# Patient Record
Sex: Male | Born: 1960
Health system: Southern US, Community
[De-identification: ages and names within clinical notes are randomized; demographics above are authoritative.]

## PROBLEM LIST (undated history)

## (undated) ENCOUNTER — Emergency Department (HOSPITAL_COMMUNITY): Disposition: A | Discharge status: Home / Self Care | Payer: Self-pay

## (undated) DIAGNOSIS — T7840XA Allergy, unspecified, initial encounter: Secondary | ICD-10-CM

## (undated) DIAGNOSIS — G4730 Sleep apnea, unspecified: Secondary | ICD-10-CM

## (undated) DIAGNOSIS — F419 Anxiety disorder, unspecified: Secondary | ICD-10-CM

## (undated) DIAGNOSIS — E271 Primary adrenocortical insufficiency: Secondary | ICD-10-CM

## (undated) DIAGNOSIS — R739 Hyperglycemia, unspecified: Secondary | ICD-10-CM

## (undated) DIAGNOSIS — I471 Supraventricular tachycardia, unspecified: Secondary | ICD-10-CM

## (undated) DIAGNOSIS — E079 Disorder of thyroid, unspecified: Secondary | ICD-10-CM

## (undated) DIAGNOSIS — I10 Essential (primary) hypertension: Secondary | ICD-10-CM

## (undated) DIAGNOSIS — C4491 Basal cell carcinoma of skin, unspecified: Secondary | ICD-10-CM

## (undated) DIAGNOSIS — C159 Malignant neoplasm of esophagus, unspecified: Secondary | ICD-10-CM

## (undated) DIAGNOSIS — Z720 Tobacco use: Secondary | ICD-10-CM

## (undated) DIAGNOSIS — E785 Hyperlipidemia, unspecified: Secondary | ICD-10-CM

## (undated) DIAGNOSIS — D333 Benign neoplasm of cranial nerves: Secondary | ICD-10-CM

## (undated) DIAGNOSIS — U071 COVID-19: Secondary | ICD-10-CM

## (undated) DIAGNOSIS — K219 Gastro-esophageal reflux disease without esophagitis: Secondary | ICD-10-CM

## (undated) HISTORY — PX: INTERCOSTAL NERVE BLOCK: SHX5021

## (undated) HISTORY — DX: Supraventricular tachycardia: I47.1

## (undated) HISTORY — PX: UPPER GASTROINTESTINAL ENDOSCOPY: SHX188

## (undated) HISTORY — DX: COVID-19: U07.1

## (undated) HISTORY — PX: OTHER SURGICAL HISTORY: SHX169

## (undated) HISTORY — DX: Essential (primary) hypertension: I10

## (undated) HISTORY — DX: Sleep apnea, unspecified: G47.30

## (undated) HISTORY — DX: Supraventricular tachycardia, unspecified: I47.10

## (undated) HISTORY — DX: Gastro-esophageal reflux disease without esophagitis: K21.9

## (undated) HISTORY — PX: COLONOSCOPY: SHX174

## (undated) HISTORY — DX: Disorder of thyroid, unspecified: E07.9

## (undated) HISTORY — DX: Anxiety disorder, unspecified: F41.9

## (undated) HISTORY — DX: Allergy, unspecified, initial encounter: T78.40XA

## (undated) HISTORY — DX: Tobacco use: Z72.0

## (undated) HISTORY — PX: SHOULDER SURGERY: SHX246

## (undated) HISTORY — DX: Hyperlipidemia, unspecified: E78.5

## (undated) HISTORY — DX: Hyperglycemia, unspecified: R73.9

---

## 1989-05-10 HISTORY — PX: LAPAROSCOPIC CHOLECYSTECTOMY: SUR755

## 1999-03-18 ENCOUNTER — Ambulatory Visit (HOSPITAL_COMMUNITY): Admission: RE | Admit: 1999-03-18 | Discharge: 1999-03-18 | Payer: Self-pay | Admitting: Orthopedic Surgery

## 1999-03-18 ENCOUNTER — Encounter: Payer: Self-pay | Admitting: Orthopedic Surgery

## 2003-09-20 ENCOUNTER — Other Ambulatory Visit: Payer: Self-pay

## 2004-04-21 ENCOUNTER — Ambulatory Visit: Payer: Self-pay | Admitting: Internal Medicine

## 2004-04-24 ENCOUNTER — Ambulatory Visit: Payer: Self-pay | Admitting: Gastroenterology

## 2004-05-19 ENCOUNTER — Ambulatory Visit: Payer: Self-pay | Admitting: Gastroenterology

## 2004-05-19 ENCOUNTER — Ambulatory Visit (HOSPITAL_COMMUNITY): Admission: RE | Admit: 2004-05-19 | Discharge: 2004-05-19 | Payer: Self-pay | Admitting: Gastroenterology

## 2004-05-20 ENCOUNTER — Ambulatory Visit: Payer: Self-pay | Admitting: Gastroenterology

## 2004-06-02 ENCOUNTER — Ambulatory Visit: Payer: Self-pay | Admitting: Internal Medicine

## 2004-06-19 ENCOUNTER — Ambulatory Visit: Payer: Self-pay | Admitting: Internal Medicine

## 2004-07-13 ENCOUNTER — Ambulatory Visit: Payer: Self-pay | Admitting: Internal Medicine

## 2004-07-14 ENCOUNTER — Ambulatory Visit: Payer: Self-pay | Admitting: Family Medicine

## 2004-10-30 ENCOUNTER — Ambulatory Visit: Payer: Self-pay | Admitting: Family Medicine

## 2004-10-30 ENCOUNTER — Encounter (INDEPENDENT_AMBULATORY_CARE_PROVIDER_SITE_OTHER): Payer: Self-pay | Admitting: *Deleted

## 2004-11-11 ENCOUNTER — Ambulatory Visit: Payer: Self-pay | Admitting: Internal Medicine

## 2005-01-18 ENCOUNTER — Ambulatory Visit: Payer: Self-pay | Admitting: Internal Medicine

## 2005-05-19 ENCOUNTER — Encounter: Admission: RE | Admit: 2005-05-19 | Discharge: 2005-05-19 | Payer: Self-pay | Admitting: Orthopedic Surgery

## 2005-06-25 ENCOUNTER — Encounter: Admission: RE | Admit: 2005-06-25 | Discharge: 2005-06-25 | Payer: Self-pay | Admitting: Unknown Physician Specialty

## 2005-07-06 ENCOUNTER — Encounter: Admission: RE | Admit: 2005-07-06 | Discharge: 2005-07-06 | Payer: Self-pay | Admitting: Unknown Physician Specialty

## 2005-07-15 ENCOUNTER — Ambulatory Visit: Payer: Self-pay | Admitting: Pulmonary Disease

## 2005-08-05 ENCOUNTER — Ambulatory Visit: Payer: Self-pay | Admitting: Pulmonary Disease

## 2005-09-27 ENCOUNTER — Ambulatory Visit: Payer: Self-pay | Admitting: Internal Medicine

## 2005-10-06 ENCOUNTER — Encounter: Admission: RE | Admit: 2005-10-06 | Discharge: 2005-10-06 | Payer: Self-pay | Admitting: Orthopedic Surgery

## 2005-10-28 ENCOUNTER — Ambulatory Visit (HOSPITAL_BASED_OUTPATIENT_CLINIC_OR_DEPARTMENT_OTHER): Admission: RE | Admit: 2005-10-28 | Discharge: 2005-10-28 | Payer: Self-pay | Admitting: Orthopedic Surgery

## 2006-01-14 ENCOUNTER — Ambulatory Visit: Payer: Self-pay | Admitting: Internal Medicine

## 2006-07-06 ENCOUNTER — Ambulatory Visit: Payer: Self-pay | Admitting: Internal Medicine

## 2007-03-10 ENCOUNTER — Encounter: Admission: RE | Admit: 2007-03-10 | Discharge: 2007-03-10 | Payer: Self-pay | Admitting: Unknown Physician Specialty

## 2009-05-15 ENCOUNTER — Encounter: Admission: RE | Admit: 2009-05-15 | Discharge: 2009-05-15 | Payer: Self-pay | Admitting: Unknown Physician Specialty

## 2009-08-07 ENCOUNTER — Encounter: Admission: RE | Admit: 2009-08-07 | Discharge: 2009-08-07 | Payer: Self-pay | Admitting: Unknown Physician Specialty

## 2010-09-25 NOTE — Op Note (Signed)
Jose Hayes, Jose Hayes            ACCOUNT NO.:  [id number]   MEDICAL RECORD NO.:  [id number]          PATIENT TYPE:  AMB   LOCATION:  DSC                          FACILITY:  [facility]   PHYSICIAN:  Loreta Ave, M.D. DATE OF BIRTH:  [birth year]   DATE OF PROCEDURE:  10/28/2005  DATE OF DISCHARGE:                                 OPERATIVE REPORT   PREOPERATIVE DIAGNOSES:  1.  Acute dislocation with anterior instability, right shoulder.  2.  Bankart lesion.  3.  Previous open capsular stabilization in 1990 without intervening      instability until new traumatic event.   POSTOPERATIVE DIAGNOSES:  1.  Acute dislocation with anterior instability, right shoulder.  2.  Anterior superior labrum anterior to posterior lesion, Bankart lesion,      acute-on-chronic Hill-Sachs lesion, and one osteochondral loose body.  3.  Previous open capsular stabilization in 1990 without intervening      instability until new traumatic event.   PROCEDURE:  With shoulder exam under anesthesia, arthroscopy, chondroplasty  of the humerus, removal of loose body, Bankart reconstruction and superior  labrum anterior to posterior repair utilizing four bioabsorbable knot-less  Arthrex anchors and FiberWire sutures.   SURGEON:  Loreta Ave, M.D.   ASSISTANT:  Genene Churn. Denton Meek., present throughout the case.   SPECIMENS:  None.   CULTURES:  None.   COMPLICATIONS:  None.   DRESSINGS:  Sterile compressive shoulder immobilizer.   PROCEDURE:  Patient was brought to the operating room, placed on the  operating room table in the supine position.  After adequate anesthesia had  been obtained, the shoulder was examined.  Full motion with increased  external rotation as well as anterior instability.  No posterior  instability.  With the patient in a beach-chair position, prepped and draped  in the usual sterile fashion.  One posterior and two anterior portals  created with arthroscopic guidance.  The  shoulder was entered with the  arthroscope and standardly inspected.  Bankart lesion from the 12 o'clock  position, which was actually in the anterior SLAP tear, all the way down to  the 6 o'clock position.  Despite the degree of previous intervention, the  shoulder actually looked fairly good.  A 1.5 cm impact lesion, Hill-Sach's  type, back of the humerus, acute-on-chronic.  One relatively large loose  body, 8 mm in diameter, was found in the pouch and removed with grasping  forceps anteriorly.  No tearing of the ligaments off the humerus.  Subscap,  rotator cuff, biceps tendon, biceps anchor intact except for that anterior  limb SLAP tear.  After thorough assessment utilizing two anterior portals,  the front of the glenoid was debrided back to healthy tissue.  I then passed  four FiberWire sutures starting at the 6 o'clock, then 4, then 2, then 12  o'clock position in the front.  All of these were then firmly anchored into  the front of the glenoid with pre-drilling and then placement of knotless  Arthrex anchors which firmly anchored the suture and anterior wall capsular  labral structure to the front of the glenoid.  Restored a nice, firm  anterior wall with good reconstruction as well as good re-anchoring of the  biceps superiorly.  Once this was confirmed, the instruments were completely  removed.  Portals were injected with Marcaine and closed with nylon.  Sterile compressive dressing applied.  Shoulder immobilizer applied.  Anesthesia reversed.  Brought to the recovery room in stable condition.  Tolerated the surgery well without complications.      Loreta Ave, M.D.  Electronically Signed     DFM/MEDQ  D:  10/28/2005  T:  10/28/2005  Job:  [id number]

## 2010-09-25 NOTE — Op Note (Signed)
NAME:  JARIAH, JARMON            ACCOUNT NO.:  [id number]   MEDICAL RECORD NO.:  [id number]          PATIENT TYPE:  AMB   LOCATION:  ENDO                         FACILITY:  [facility]   PHYSICIAN:  Malcolm T. Russella Dar, M.D. [facility] OF BIRTH:  03/12/1961   DATE OF PROCEDURE:  05/19/2004  DATE OF DISCHARGE:  05/19/2004                                 OPERATIVE REPORT   PROCEDURE:  Esophageal manometry.   This study is placed for GERD to allow ambulatory pH monitoring.   Upper esophageal sphincter study:  There is an elevated peak pressure at  203.7 mmHg.  There is normal relaxation with a normal residual pressure at  2.3 mmHg and 98% relaxation.   Esophageal body reveals normal pressures and normal peristalsis.   Lower esophageal sphincter with normal pressures and normal relaxation with  a residual pressure of 4 mmHg.   IMPRESSION:  Essentially normal manometry study.   PLAN:  Proceed with 24-hour pH probe now that the lower esophageal sphincter  has been correctly manometrically determined.      MTS/MEDQ  D:  06/02/2004  T:  06/02/2004  Job:  [id number]

## 2010-09-25 NOTE — Op Note (Signed)
NAME:  Jose Hayes, Jose Hayes            ACCOUNT NO.:  [id number]   MEDICAL RECORD NO.:  [id number]          PATIENT TYPE:  AMB   LOCATION:  ENDO                         FACILITY:  [facility]   PHYSICIAN:  Malcolm T. Russella Dar, M.D. [facility] OF BIRTH:  15-Jan-1961   DATE OF PROCEDURE:  DATE OF DISCHARGE:  05/19/2004                                 OPERATIVE REPORT   AMBULATORY PH MONITORING REPORT:   INDICATIONS:  Evaluation of GERD.   PROCEDURE SUMMARY:  The proximal channel revealed 0.6% of the time greater  than pH 4 with 38 episodes of reflux recorded. The longest episode was 1  minute. The oscillatory index was 0.6.   The distal esophageal port revealed 0.7% of the time a low pH 4 with 37  episodes of reflux, the longest episode of reflux measured 2 minutes. The  oscillatory index was 0.4%. The Johnson-DeMeester score for the distal  channel was 5.6 with normal being less than 22.   IMPRESSION:  No evidence for clinically significant GERD on this study.      MTS/MEDQ  D:  06/02/2004  T:  06/02/2004  Job:  [id number]

## 2010-10-13 ENCOUNTER — Encounter: Payer: Self-pay | Admitting: Gastroenterology

## 2011-11-05 ENCOUNTER — Observation Stay: Payer: Self-pay | Admitting: Internal Medicine

## 2011-11-05 DIAGNOSIS — I499 Cardiac arrhythmia, unspecified: Secondary | ICD-10-CM

## 2011-11-05 DIAGNOSIS — I517 Cardiomegaly: Secondary | ICD-10-CM

## 2011-11-05 LAB — CBC WITH DIFFERENTIAL/PLATELET
Basophil %: 0.7 %
Eosinophil #: 0.2 10*3/uL (ref 0.0–0.7)
Eosinophil %: 2.7 %
HCT: 46.4 % (ref 40.0–52.0)
MCH: 29.8 pg (ref 26.0–34.0)
MCV: 89 fL (ref 80–100)
Neutrophil #: 4 10*3/uL (ref 1.4–6.5)
Neutrophil %: 53.7 %
Platelet: 252 10*3/uL (ref 150–440)
RDW: 13.9 % (ref 11.5–14.5)

## 2011-11-05 LAB — COMPREHENSIVE METABOLIC PANEL
Albumin: 4.2 g/dL (ref 3.4–5.0)
Alkaline Phosphatase: 84 U/L (ref 50–136)
Anion Gap: 10 (ref 7–16)
Chloride: 106 mmol/L (ref 98–107)
Co2: 25 mmol/L (ref 21–32)
EGFR (African American): >60
Glucose: 157 mg/dL — ABNORMAL HIGH (ref 65–99)
SGPT (ALT): 74 U/L
Sodium: 141 mmol/L (ref 136–145)
Total Protein: 7.7 g/dL (ref 6.4–8.2)

## 2011-11-05 LAB — URINALYSIS, COMPLETE
Leukocyte Esterase: NEGATIVE
Nitrite: NEGATIVE
Ph: 7 (ref 4.5–8.0)
Protein: NEGATIVE
Squamous Epithelial: NONE SEEN
WBC UR: NONE SEEN /HPF (ref 0–5)

## 2011-11-05 LAB — CK TOTAL AND CKMB (NOT AT ARMC)
CK, Total: 259 U/L — ABNORMAL HIGH (ref 35–232)
CK, Total: 189 U/L (ref 35–232)
CK-MB: 2 ng/mL (ref 0.5–3.6)

## 2011-11-05 LAB — TSH: Thyroid Stimulating Horm: 1.67 u[IU]/mL

## 2011-11-05 LAB — MAGNESIUM: Magnesium: 2 mg/dL

## 2011-11-05 LAB — TROPONIN I
Troponin-I: 0.02 ng/mL
Troponin-I: 0.04 ng/mL

## 2011-11-06 LAB — LIPID PANEL
HDL Cholesterol: 41 mg/dL (ref 40–60)
Triglycerides: 107 mg/dL (ref 0–200)
VLDL Cholesterol, Calc: 21 mg/dL (ref 5–40)

## 2011-11-06 LAB — BASIC METABOLIC PANEL
Anion Gap: 6 — ABNORMAL LOW (ref 7–16)
Chloride: 107 mmol/L (ref 98–107)
Co2: 29 mmol/L (ref 21–32)
Creatinine: 1.03 mg/dL (ref 0.60–1.30)
Glucose: 93 mg/dL (ref 65–99)
Osmolality: 284 (ref 275–301)
Potassium: 4.1 mmol/L (ref 3.5–5.1)
Sodium: 142 mmol/L (ref 136–145)

## 2011-11-06 LAB — TROPONIN I: Troponin-I: 0.04 ng/mL

## 2011-11-06 LAB — CK TOTAL AND CKMB (NOT AT ARMC): CK-MB: 1.8 ng/mL (ref 0.5–3.6)

## 2011-11-29 ENCOUNTER — Encounter: Payer: Self-pay | Admitting: *Deleted

## 2011-11-29 ENCOUNTER — Encounter: Payer: Self-pay | Admitting: Cardiovascular Disease

## 2011-12-01 ENCOUNTER — Encounter: Payer: Self-pay | Admitting: Cardiovascular Disease

## 2011-12-01 ENCOUNTER — Ambulatory Visit (INDEPENDENT_AMBULATORY_CARE_PROVIDER_SITE_OTHER): Payer: BC Managed Care – PPO | Admitting: Cardiovascular Disease

## 2011-12-01 VITALS — BP 152/84 | HR 68 | Ht 69.0 in | Wt 240.0 lb

## 2011-12-01 DIAGNOSIS — R0602 Shortness of breath: Secondary | ICD-10-CM

## 2011-12-01 DIAGNOSIS — I10 Essential (primary) hypertension: Secondary | ICD-10-CM | POA: Insufficient documentation

## 2011-12-01 DIAGNOSIS — I498 Other specified cardiac arrhythmias: Secondary | ICD-10-CM

## 2011-12-01 DIAGNOSIS — E669 Obesity, unspecified: Secondary | ICD-10-CM

## 2011-12-01 DIAGNOSIS — I471 Supraventricular tachycardia, unspecified: Secondary | ICD-10-CM | POA: Insufficient documentation

## 2011-12-01 DIAGNOSIS — E785 Hyperlipidemia, unspecified: Secondary | ICD-10-CM | POA: Insufficient documentation

## 2011-12-01 DIAGNOSIS — F17200 Nicotine dependence, unspecified, uncomplicated: Secondary | ICD-10-CM

## 2011-12-01 MED ORDER — ATORVASTATIN CALCIUM 20 MG PO TABS: 20.0000 mg | ORAL_TABLET | Freq: Every day | ORAL | Status: DC

## 2011-12-01 MED ORDER — DILTIAZEM HCL ER COATED BEADS 180 MG PO CP24: 180.0000 mg | ORAL_CAPSULE | Freq: Every day | ORAL | Status: DC

## 2011-12-01 NOTE — Assessment & Plan Note (Signed)
We have encouraged continued exercise, careful diet management in an effort to lose weight. 

## 2011-12-01 NOTE — Assessment & Plan Note (Signed)
Improved rhythm since his recent discharge. We will increase the diltiazem to 180 mg daily for better rate and blood pressure control. He'll monitor his blood pressure at home.

## 2011-12-01 NOTE — Assessment & Plan Note (Signed)
We will start Lipitor 10 mg daily lab work in 3 months possibly titrating up to 20 mg daily

## 2011-12-01 NOTE — Progress Notes (Signed)
   Patient ID: Jose Hayes, male    DOB: 02-26-1961, 51 y.o.   MRN: [id number]  HPI Comments: Jose Hayes is a very pleasant 51 year old gentleman with obesity, smoking history who presented to [facility] 11/05/2011 with SVT. Rhythm broke to normal sinus rhythm in the emergency room. Adenosine did not work. Echocardiogram was essentially normal with normal ejection fraction, normal biventricular systolic pressures. He was started on diltiazem 120 mg daily with 30 mg doses for breakthrough arrhythmia.  He reports that he has done well overall but has had 2 episodes with her short tachycardia that did not last very long He has stopped smoking. He has also started to workout again.  EKG today shows normal sinus rhythm with rate 68 beats per minute, no significant ST or T wave changes   Outpatient Encounter Prescriptions as of 12/01/2011  Medication Sig Dispense Refill  . aspirin 81 MG tablet Take 81 mg by mouth daily.      Marland Kitchen diltiazem (CARDIZEM) 30 MG tablet Take 30 mg by mouth as needed.      .  diltiazem (CARDIZEM CD) 120 MG 24 hr capsule Take 120 mg by mouth daily.         Review of Systems  Constitutional: Negative.   HENT: Negative.   Eyes: Negative.   Respiratory: Negative.   Cardiovascular: Negative.   Gastrointestinal: Negative.   Musculoskeletal: Negative.   Skin: Negative.   Neurological: Negative.   Hematological: Negative.   Psychiatric/Behavioral: Negative.   All other systems reviewed and are negative.    BP 152/84  Pulse 68  Ht 5\' 9"  (1.753 m)  Wt 240 lb (108.863 kg)  BMI 35.44 kg/m2  Physical Exam  Nursing note and vitals reviewed. Constitutional: He is oriented to person, place, and time. He appears well-developed and well-nourished.  HENT:  Head: Normocephalic.  Nose: Nose normal.  Mouth/Throat: Oropharynx is clear and moist.  Eyes: Conjunctivae are normal. Pupils are equal, round, and reactive to light.  Neck: Normal range of motion. Neck supple. No  JVD present.  Cardiovascular: Normal rate, regular rhythm, S1 normal, S2 normal, normal heart sounds and intact distal pulses.  Exam reveals no gallop and no friction rub.   No murmur heard. Pulmonary/Chest: Effort normal and breath sounds normal. No respiratory distress. He has no wheezes. He has no rales. He exhibits no tenderness.  Abdominal: Soft. Bowel sounds are normal. He exhibits no distension. There is no tenderness.  Musculoskeletal: Normal range of motion. He exhibits no edema and no tenderness.  Lymphadenopathy:    He has no cervical adenopathy.  Neurological: He is alert and oriented to person, place, and time. Coordination normal.  Skin: Skin is warm and dry. No rash noted. No erythema.  Psychiatric: He has a normal mood and affect. His behavior is normal. Judgment and thought content normal.           Assessment and Plan

## 2011-12-01 NOTE — Patient Instructions (Addendum)
You are doing well. Please take extra diltiazem 30 mg with the 120 mg for total of 180 mg daily If this is too much, call the office and we will stay on 120 mg daily  Please start atorvastatin/lipitor 1/2 daily to start. We can advanace to full pill later if cholesterol continues to be elevated  Please call us if you have new issues that need to be addressed before your next appt.  Your physician wants you to follow-up in: 6 months.  You will receive a reminder letter in the mail two months in advance. If you don't receive a letter, please call our office to schedule the follow-up appointment.

## 2011-12-01 NOTE — Assessment & Plan Note (Signed)
He has stopped smoking. We have complemented him on this

## 2011-12-01 NOTE — Assessment & Plan Note (Signed)
He does report that blood pressure has improved on diltiazem. He'll continue to monitor his pressures at home on a higher dose diltiazem.

## 2012-01-13 ENCOUNTER — Other Ambulatory Visit: Payer: Self-pay | Admitting: Unknown Physician Specialty

## 2012-01-13 DIAGNOSIS — M79632 Pain in left forearm: Secondary | ICD-10-CM

## 2012-01-14 ENCOUNTER — Ambulatory Visit
Admission: RE | Admit: 2012-01-14 | Discharge: 2012-01-14 | Disposition: A | Discharge status: Home / Self Care | Payer: BC Managed Care – PPO | Source: Ambulatory Visit | Attending: Unknown Physician Specialty | Admitting: Unknown Physician Specialty

## 2012-01-14 DIAGNOSIS — M79632 Pain in left forearm: Secondary | ICD-10-CM

## 2012-01-27 ENCOUNTER — Encounter: Payer: Self-pay | Admitting: Gastroenterology

## 2012-04-12 ENCOUNTER — Telehealth: Payer: Self-pay | Admitting: Cardiovascular Disease

## 2012-04-12 NOTE — Telephone Encounter (Signed)
See below and advise thanks 

## 2012-04-12 NOTE — Telephone Encounter (Signed)
Pt says he is taking diltiazem 180 mg daily and atorvastatin. He stopped ASA He says he was noticing a "flushed" feeling and decided to hold diltiazem for a few days Says the flushing sensation improved but BP began to increase again He resumed diltiazem yesterday BP today=150/90 Flushing sensation returned I told him I would discuss with Dr. Mariah Milling to see if he needs to make any adjustments to meds, etc and call pt back Understanding verb

## 2012-04-12 NOTE — Telephone Encounter (Signed)
Pt called stating that his BP is running high. Was feeling flush so he stopped his meds for a few days and BP went up but now is not going back to where it was. Wants to know if he needs to do something different.

## 2012-04-12 NOTE — Telephone Encounter (Signed)
We could change him from diltiazem to something similar hold verapamil Would start verapamil sustained release 240 mg daily

## 2012-04-13 ENCOUNTER — Other Ambulatory Visit: Payer: Self-pay

## 2012-04-13 MED ORDER — VERAPAMIL HCL ER 240 MG PO TBCR: 240.0000 mg | EXTENDED_RELEASE_TABLET | Freq: Every day | ORAL | Status: DC

## 2012-04-13 NOTE — Telephone Encounter (Signed)
Pt informed Understanding verb New RX sent to pharm  

## 2012-06-02 ENCOUNTER — Ambulatory Visit (INDEPENDENT_AMBULATORY_CARE_PROVIDER_SITE_OTHER): Payer: BC Managed Care – PPO | Admitting: Cardiovascular Disease

## 2012-06-02 ENCOUNTER — Encounter: Payer: Self-pay | Admitting: Cardiovascular Disease

## 2012-06-02 VITALS — BP 142/92 | HR 75 | Ht 68.0 in | Wt 251.2 lb

## 2012-06-02 DIAGNOSIS — R0602 Shortness of breath: Secondary | ICD-10-CM

## 2012-06-02 DIAGNOSIS — E669 Obesity, unspecified: Secondary | ICD-10-CM

## 2012-06-02 DIAGNOSIS — F17200 Nicotine dependence, unspecified, uncomplicated: Secondary | ICD-10-CM

## 2012-06-02 DIAGNOSIS — E785 Hyperlipidemia, unspecified: Secondary | ICD-10-CM

## 2012-06-02 DIAGNOSIS — I10 Essential (primary) hypertension: Secondary | ICD-10-CM

## 2012-06-02 DIAGNOSIS — I471 Supraventricular tachycardia: Secondary | ICD-10-CM

## 2012-06-02 DIAGNOSIS — I498 Other specified cardiac arrhythmias: Secondary | ICD-10-CM

## 2012-06-02 NOTE — Assessment & Plan Note (Signed)
Weight continues to trend upwards. We have encouraged him to watch his calorie intake. 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

## 2012-06-02 NOTE — Assessment & Plan Note (Signed)
Currently using electronic cigarette.

## 2012-06-02 NOTE — Assessment & Plan Note (Signed)
No recent arrhythmia. We'll continue current dose of medications

## 2012-06-02 NOTE — Patient Instructions (Addendum)
You are doing well. No medication changes were made.  Monitor the blood pressure Start back on lipitor for cholesterol Keep the weight coming down  Please call us if you have new issues that need to be addressed before your next appt.  Your physician wants you to follow-up in: 12 months.  You will receive a reminder letter in the mail two months in advance. If you don't receive a letter, please call our office to schedule the follow-up appointment.

## 2012-06-02 NOTE — Assessment & Plan Note (Signed)
Blood pressure is well controlled on today's visit. No changes made to the medications. 

## 2012-06-02 NOTE — Progress Notes (Signed)
   Patient ID: Jose Hayes, male    DOB: Aug 03, 1960, 52 y.o.   MRN: [id number]  HPI Comments: Jose Hayes is a very pleasant 52 year old gentleman with obesity, smoking history who presented to [facility] 11/05/2011 with SVT. Rhythm broke to normal sinus rhythm in the emergency room. Adenosine did not work. He presents for routine followup  Echocardiogram was essentially normal with normal ejection fraction, normal biventricular systolic pressures. He was started on diltiazem 120 mg daily with 30 mg doses for breakthrough arrhythmia.  He reports that he has done well overall. He continues to trend upwards. No recent arrhythmia. He continues to have trouble breathing through his nose with a nasal congestion issue. Previous evaluation by ENT. He has stopped smoking. Using electronic cigarette. Heart to work out as he is driving 3 hours per day. 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  Blood work June 2013 shows total cholesterol 191, LDL 129, HDL 41 Stopped his statin on his own, no side effects  EKG today shows normal sinus rhythm with rate 75 beats per minute, no significant ST or T wave changes   Outpatient Encounter Prescriptions as of 06/02/2012  Medication Sig Dispense Refill  . aspirin 81 MG tablet Take 81 mg by mouth daily.      Marland Kitchen atorvastatin (LIPITOR) 20 MG tablet Take 1 tablet (20 mg total) by mouth daily.  90 tablet  3  . Multiple Vitamin (MULTIVITAMIN) tablet Take 1 tablet by mouth daily.      . verapamil (CALAN-SR) 240 MG CR tablet Take 1 tablet (240 mg total) by mouth daily.  90 tablet  3    Review of Systems  Constitutional: Negative.   HENT: Negative.        Nasal congestion  Eyes: Negative.   Respiratory: Negative.   Cardiovascular: Negative.   Gastrointestinal: Negative.   Musculoskeletal: Negative.   Skin: Negative.   Neurological: Negative.   Hematological: Negative.   Psychiatric/Behavioral: Negative.   All other systems reviewed and are negative.    BP 142/92  Pulse 75   Ht 5\' 8"  (1.727 m)  Wt 251 lb 4 oz (113.966 kg)  BMI 38.20 kg/m2  Physical Exam  Nursing note and vitals reviewed. Constitutional: He is oriented to person, place, and time. He appears well-developed and well-nourished.  HENT:  Head: Normocephalic.  Nose: Nose normal.  Mouth/Throat: Oropharynx is clear and moist.  Eyes: Conjunctivae normal are normal. Pupils are equal, round, and reactive to light.  Neck: Normal range of motion. Neck supple. No JVD present.  Cardiovascular: Normal rate, regular rhythm, S1 normal, S2 normal, normal heart sounds and intact distal pulses.  Exam reveals no gallop and no friction rub.   No murmur heard. Pulmonary/Chest: Effort normal and breath sounds normal. No respiratory distress. He has no wheezes. He has no rales. He exhibits no tenderness.  Abdominal: Soft. Bowel sounds are normal. He exhibits no distension. There is no tenderness.  Musculoskeletal: Normal range of motion. He exhibits no edema and no tenderness.  Lymphadenopathy:    He has no cervical adenopathy.  Neurological: He is alert and oriented to person, place, and time. Coordination normal.  Skin: Skin is warm and dry. No rash noted. No erythema.  Psychiatric: He has a normal mood and affect. His behavior is normal. Judgment and thought content normal.           Assessment and Plan

## 2012-06-02 NOTE — Assessment & Plan Note (Addendum)
We encouraged him to restart his cholesterol medication. We'll call in Lipitor 20 mg daily. Prior smoking history puts him at increased risk.

## 2013-01-19 ENCOUNTER — Other Ambulatory Visit: Payer: Self-pay | Admitting: Cardiovascular Disease

## 2013-04-05 ENCOUNTER — Other Ambulatory Visit: Payer: Self-pay | Admitting: Cardiovascular Disease

## 2013-06-06 ENCOUNTER — Encounter: Payer: Self-pay | Admitting: Cardiovascular Disease

## 2013-06-06 ENCOUNTER — Ambulatory Visit (INDEPENDENT_AMBULATORY_CARE_PROVIDER_SITE_OTHER): Payer: BC Managed Care – PPO | Admitting: Cardiovascular Disease

## 2013-06-06 VITALS — BP 120/90 | HR 74 | Ht 68.0 in | Wt 257.0 lb

## 2013-06-06 DIAGNOSIS — R0602 Shortness of breath: Secondary | ICD-10-CM

## 2013-06-06 DIAGNOSIS — I471 Supraventricular tachycardia: Secondary | ICD-10-CM

## 2013-06-06 DIAGNOSIS — I10 Essential (primary) hypertension: Secondary | ICD-10-CM

## 2013-06-06 DIAGNOSIS — I498 Other specified cardiac arrhythmias: Secondary | ICD-10-CM

## 2013-06-06 DIAGNOSIS — E669 Obesity, unspecified: Secondary | ICD-10-CM

## 2013-06-06 DIAGNOSIS — F17200 Nicotine dependence, unspecified, uncomplicated: Secondary | ICD-10-CM

## 2013-06-06 DIAGNOSIS — E785 Hyperlipidemia, unspecified: Secondary | ICD-10-CM

## 2013-06-06 DIAGNOSIS — I4902 Ventricular flutter: Secondary | ICD-10-CM

## 2013-06-06 MED ORDER — VERAPAMIL HCL 40 MG PO TABS: 40.0000 mg | ORAL_TABLET | Freq: Three times a day (TID) | ORAL | Status: DC | PRN

## 2013-06-06 NOTE — Assessment & Plan Note (Signed)
Weight continues to be a major issue. Suggested he have primary care check thyroid and testosterone level. Follow a low carbohydrate diet

## 2013-06-06 NOTE — Assessment & Plan Note (Signed)
Encouraged him to stay on his Lipitor. He reports that primary care check his lipids on an annual basis

## 2013-06-06 NOTE — Assessment & Plan Note (Signed)
Suggested he continue on his verapamil. Take short acting verapamil as needed for any breakthrough arrhythmia or high blood pressure

## 2013-06-06 NOTE — Progress Notes (Signed)
Patient ID: Jose Hayes, male    DOB: [birth year], 53 y.o.   MRN: [id number]  HPI Comments: [name] is a very pleasant 53 year-old gentleman with obesity, smoking history who presented to [facility] 11/05/2011 with SVT. Rhythm broke to normal sinus rhythm in the emergency room. Adenosine did not work. He presents for routine followup  In followup today, he denies having any further episodes. He does have short runs of possible tachycardia in the recent setting of sinusitis. Recently started on antibiotic. Also reports significant weight gain and possible sleep apnea. He has fatigue during the daytime. Reports his blood pressure has been well-controlled Reports he has stopped smoking. Previously was using an electronic cigarette  Echocardiogram was essentially normal with normal ejection fraction, normal biventricular systolic pressures.  Hard to work out as he is driving 3 hours per day. 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  Blood work June 2013 shows total cholesterol 191, LDL 129, HDL 41 Stopped his statin on his own, no side effects  EKG today shows normal sinus rhythm with rate 74 beats per minute, no significant ST or T wave changes   Outpatient Encounter Prescriptions as of 06/06/2013  Medication Sig  . aspirin 81 MG tablet Take 81 mg by mouth daily.  Marland Kitchen atorvastatin (LIPITOR) 20 MG tablet TAKE 1 TABLET BY MOUTH DAILY.  . chlorpheniramine-HYDROcodone (TUSSIONEX) 10-8 MG/5ML LQCR Take 5 mLs by mouth at bedtime as needed.   . moxifloxacin (AVELOX) 400 MG tablet Take 400 mg by mouth daily at 8 pm.   . Multiple Vitamin (MULTIVITAMIN) tablet Take 1 tablet by mouth daily.  . verapamil (CALAN-SR) 240 MG CR tablet TAKE 1 TABLET (240 MG TOTAL) BY MOUTH DAILY.    Review of Systems  Constitutional: Negative.   HENT: Negative.        Nasal congestion, sinus pressure  Eyes: Negative.   Respiratory: Negative.   Cardiovascular: Positive for palpitations.  Gastrointestinal: Negative.   Endocrine:  Negative.   Musculoskeletal: Negative.   Skin: Negative.   Allergic/Immunologic: Negative.   Neurological: Negative.   Hematological: Negative.   Psychiatric/Behavioral: Negative.   All other systems reviewed and are negative.    BP 120/90  Pulse 74  Ht 5\' 8"  (1.727 m)  Wt 257 lb (116.574 kg)  BMI 39.09 kg/m2  Physical Exam  Nursing note and vitals reviewed. Constitutional: He is oriented to person, place, and time. He appears well-developed and well-nourished.  HENT:  Head: Normocephalic.  Nose: Nose normal.  Mouth/Throat: Oropharynx is clear and moist.  Eyes: Conjunctivae are normal. Pupils are equal, round, and reactive to light.  Neck: Normal range of motion. Neck supple. No JVD present.  Cardiovascular: Normal rate, regular rhythm, S1 normal, S2 normal, normal heart sounds and intact distal pulses.  Exam reveals no gallop and no friction rub.   No murmur heard. Pulmonary/Chest: Effort normal and breath sounds normal. No respiratory distress. He has no wheezes. He has no rales. He exhibits no tenderness.  Abdominal: Soft. Bowel sounds are normal. He exhibits no distension. There is no tenderness.  Musculoskeletal: Normal range of motion. He exhibits no edema and no tenderness.  Lymphadenopathy:    He has no cervical adenopathy.  Neurological: He is alert and oriented to person, place, and time. Coordination normal.  Skin: Skin is warm and dry. No rash noted. No erythema.  Psychiatric: He has a normal mood and affect. His behavior is normal. Judgment and thought content normal.      Assessment and Plan

## 2013-06-06 NOTE — Patient Instructions (Addendum)
You are doing well. Please take extra low dose verapamil 40 mg dose as needed for tachycardia or high blood pressure  Ask Primary care about thyroid (TSH), and free testosterone level   Please call us if you have new issues that need to be addressed before your next appt.  Your physician wants you to follow-up in: 12 months.  You will receive a reminder letter in the mail two months in advance. If you don't receive a letter, please call our office to schedule the follow-up appointment.

## 2013-06-06 NOTE — Assessment & Plan Note (Signed)
Blood pressure is well controlled on today's visit. No changes made to the medications. 

## 2013-06-06 NOTE — Assessment & Plan Note (Signed)
We have encouraged him to continue to work on weaning his cigarettes and smoking cessation. He will continue to work on this and does not want any assistance with chantix.  

## 2013-09-04 ENCOUNTER — Encounter: Payer: Self-pay | Admitting: Gastroenterology

## 2013-10-17 ENCOUNTER — Other Ambulatory Visit: Payer: Self-pay | Admitting: Cardiovascular Disease

## 2013-10-19 ENCOUNTER — Encounter: Payer: BC Managed Care – PPO | Admitting: Gastroenterology

## 2013-11-23 ENCOUNTER — Ambulatory Visit (AMBULATORY_SURGERY_CENTER): Payer: Self-pay | Admitting: *Deleted

## 2013-11-23 VITALS — Ht 69.0 in | Wt 257.6 lb

## 2013-11-23 DIAGNOSIS — Z860100 Personal history of colon polyps, unspecified: Secondary | ICD-10-CM

## 2013-11-23 MED ORDER — MOVIPREP 100 G PO SOLR: ORAL | Status: DC

## 2013-11-23 NOTE — Progress Notes (Signed)
No allergies to eggs or soy. No problems with anesthesia.  Pt given Emmi instructions for colonoscopy  No oxygen use  No diet drug use  

## 2013-11-26 ENCOUNTER — Encounter: Payer: Self-pay | Admitting: Gastroenterology

## 2013-12-07 ENCOUNTER — Ambulatory Visit (AMBULATORY_SURGERY_CENTER): Payer: BC Managed Care – PPO | Admitting: Gastroenterology

## 2013-12-07 ENCOUNTER — Encounter: Payer: Self-pay | Admitting: Gastroenterology

## 2013-12-07 VITALS — BP 119/79 | HR 68 | Temp 98.3°F | Resp 27 | Ht 69.0 in | Wt 257.0 lb

## 2013-12-07 DIAGNOSIS — D126 Benign neoplasm of colon, unspecified: Secondary | ICD-10-CM

## 2013-12-07 DIAGNOSIS — Z86010 Personal history of colonic polyps: Secondary | ICD-10-CM

## 2013-12-07 MED ORDER — SODIUM CHLORIDE 0.9 % IV SOLN: 500.0000 mL | INTRAVENOUS | Status: DC

## 2013-12-07 NOTE — Progress Notes (Signed)
Called to room to assist during endoscopic procedure.  Patient ID and intended procedure confirmed with present staff. Received instructions for my participation in the procedure from the performing physician.  

## 2013-12-07 NOTE — Op Note (Signed)
[facility] [place]  [address]. [place], [id number]   COLONOSCOPY PROCEDURE REPORT  PATIENT: Jose Hayes, Jose Hayes  MR#: [id number] BIRTHDATE: 1960-08-24 , 50  yrs. old GENDER: Male ENDOSCOPIST: Ladene Artist, MD, [facility] PROCEDURE DATE:  12/07/2013 PROCEDURE:   Colonoscopy with biopsy and snare polypectomy First Screening Colonoscopy - Avg.  risk and is 50 yrs.  old or older - No.  Prior Negative Screening - Now for repeat screening. N/A  History of Adenoma - Now for follow-up colonoscopy & has been > or = to 3 yrs.  Yes hx of adenoma.  Has been 3 or more years since last colonoscopy.  Polyps Removed Today? Yes. ASA CLASS:   Class II INDICATIONS:Patient's personal history of adenomatous colon polyps.  MEDICATIONS: MAC sedation, administered by CRNA and propofol (Diprivan) 300mg  IV DESCRIPTION OF PROCEDURE:   After the risks benefits and alternatives of the procedure were thoroughly explained, informed consent was obtained.  A digital rectal exam revealed no abnormalities of the rectum.   The [id number]  endoscope was introduced through the anus and advanced to the cecum, which was identified by both the appendix and ileocecal valve. No adverse events experienced.   The quality of the prep was excellent, using MoviPrep  The instrument was then slowly withdrawn as the colon was fully examined.  COLON FINDINGS: Two sessile polyps ranging between 3-2mm in size were found in the transverse colon.  A polypectomy was performed with a cold snare and with cold forceps.  The resection was complete and the polyp tissue was completely retrieved.   A sessile polyp measuring 4 mm in size was found in the sigmoid colon.  A polypectomy was performed with cold forceps.  The resection was complete and the polyp tissue was completely retrieved.   The colon was otherwise normal.  There was no diverticulosis, inflammation, polyps or cancers unless previously stated.  Retroflexed  views revealed no abnormalities. The time to cecum=1 minutes 14 seconds. Withdrawal time=11 minutes 16 seconds.  The scope was withdrawn and the procedure completed. COMPLICATIONS: There were no complications.  ENDOSCOPIC IMPRESSION: 1.   Two sessile polyps ranging between 3-66mm in the transverse colon; polypectomy performed with a cold snare and with cold forceps 2.   Sessile polyp measuring 4 mm in the sigmoid colon; polypectomy performed with cold forceps  RECOMMENDATIONS: 1.  Await pathology results 2.  Repeat Colonoscopy in 5 years.  eSigned:  Ladene Artist, MD, [facility] 12/07/2013 10:04 AM

## 2013-12-07 NOTE — Patient Instructions (Signed)
YOU HAD AN ENDOSCOPIC PROCEDURE TODAY AT THE [facility]: Refer to the procedure report that was given to you for any specific questions about what was found during the examination.  If the procedure report does not answer your questions, please call your gastroenterologist to clarify.  If you requested that your care partner not be given the details of your procedure findings, then the procedure report has been included in a sealed envelope for you to review at your convenience later.  YOU SHOULD EXPECT: Some feelings of bloating in the abdomen. Passage of more gas than usual.  Walking can help get rid of the air that was put into your GI tract during the procedure and reduce the bloating. If you had a lower endoscopy (such as a colonoscopy or flexible sigmoidoscopy) you may notice spotting of blood in your stool or on the toilet paper. If you underwent a bowel prep for your procedure, then you may not have a normal bowel movement for a few days.  DIET: Your first meal following the procedure should be a light meal and then it is ok to progress to your normal diet.  A half-sandwich or bowl of soup is an example of a good first meal.  Heavy or fried foods are harder to digest and may make you feel nauseous or bloated.  Likewise meals heavy in dairy and vegetables can cause extra gas to form and this can also increase the bloating.  Drink plenty of fluids but you should avoid alcoholic beverages for 24 hours.  ACTIVITY: Your care partner should take you home directly after the procedure.  You should plan to take it easy, moving slowly for the rest of the day.  You can resume normal activity the day after the procedure however you should NOT DRIVE or use heavy machinery for 24 hours (because of the sedation medicines used during the test).    SYMPTOMS TO REPORT IMMEDIATELY: A gastroenterologist can be reached at any hour.  During normal business hours, 8:30 AM to 5:00 PM Monday through Friday,  call [phone].  After hours and on weekends, please call the GI answering service at [phone] who will take a message and have the physician on call contact you.   Following lower endoscopy (colonoscopy or flexible sigmoidoscopy):  Excessive amounts of blood in the stool  Significant tenderness or worsening of abdominal pains  Swelling of the abdomen that is new, acute  Fever of 100F or higher  FOLLOW UP: If any biopsies were taken you will be contacted by phone or by letter within the next 1-3 weeks.  Call your gastroenterologist if you have not heard about the biopsies in 3 weeks.  Our staff will call the home number listed on your records the next business day following your procedure to check on you and address any questions or concerns that you may have at that time regarding the information given to you following your procedure. This is a courtesy call and so if there is no answer at the home number and we have not heard from you through the emergency physician on call, we will assume that you have returned to your regular daily activities without incident.  SIGNATURES/CONFIDENTIALITY: You and/or your care partner have signed paperwork which will be entered into your electronic medical record.  These signatures attest to the fact that that the information above on your After Visit Summary has been reviewed and is understood.  Full responsibility of the confidentiality of this   discharge information lies with you and/or your care-partner.  Recommendations Await pathology results Next colonoscopy in 5 years. 

## 2013-12-07 NOTE — Progress Notes (Signed)
Report to PACU, RN, vss, BBS= Clear.  

## 2013-12-10 ENCOUNTER — Telehealth: Payer: Self-pay | Admitting: *Deleted

## 2013-12-10 NOTE — Telephone Encounter (Signed)
No answer, left message to call if questions or concerns. 

## 2013-12-15 ENCOUNTER — Encounter: Payer: Self-pay | Admitting: Gastroenterology

## 2014-02-12 ENCOUNTER — Encounter: Payer: Self-pay | Admitting: Gastroenterology

## 2014-04-26 ENCOUNTER — Other Ambulatory Visit: Payer: Self-pay | Admitting: Cardiovascular Disease

## 2014-08-01 ENCOUNTER — Encounter: Payer: Self-pay | Admitting: Cardiovascular Disease

## 2014-08-01 ENCOUNTER — Ambulatory Visit (INDEPENDENT_AMBULATORY_CARE_PROVIDER_SITE_OTHER): Payer: BLUE CROSS/BLUE SHIELD | Admitting: Cardiovascular Disease

## 2014-08-01 VITALS — BP 110/70 | HR 86 | Ht 69.0 in | Wt 264.2 lb

## 2014-08-01 DIAGNOSIS — Z720 Tobacco use: Secondary | ICD-10-CM

## 2014-08-01 DIAGNOSIS — I471 Supraventricular tachycardia: Secondary | ICD-10-CM

## 2014-08-01 DIAGNOSIS — E785 Hyperlipidemia, unspecified: Secondary | ICD-10-CM

## 2014-08-01 DIAGNOSIS — E669 Obesity, unspecified: Secondary | ICD-10-CM

## 2014-08-01 DIAGNOSIS — R0602 Shortness of breath: Secondary | ICD-10-CM | POA: Diagnosis not present

## 2014-08-01 DIAGNOSIS — F17200 Nicotine dependence, unspecified, uncomplicated: Secondary | ICD-10-CM

## 2014-08-01 DIAGNOSIS — I10 Essential (primary) hypertension: Secondary | ICD-10-CM | POA: Diagnosis not present

## 2014-08-01 MED ORDER — VERAPAMIL HCL 40 MG PO TABS: 40.0000 mg | ORAL_TABLET | Freq: Three times a day (TID) | ORAL | Status: DC | PRN

## 2014-08-01 NOTE — Progress Notes (Signed)
Patient ID: Jose Hayes, male    DOB: [birth year], 54 y.o.   MRN: [id number]  HPI Comments: Jose Hayes is a very pleasant 54 year-old gentleman with obesity, smoking history who presented to [facility] 11/05/2011 with SVT. Rhythm broke to normal sinus rhythm in the emergency room. Adenosine did not work. He presents for routine followup of his arrhythmia  Hard to work out as he is driving 3 hours per day. 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  In followup today, he denies having any further episodes. He is working long hours, goes to bed late, wakes up at 5:30. Spends most of his weekends taking care of his daughter who is a Engineer, manufacturing, 54 years old. Weight continues to be an issue, smokes here and there, off and on Denies having significant snoring or apnea. Does get up to urinate 3 times per night Reports blood pressure typically well controlled when he rests, higher after exertion. Typically 607 systolic   EKG on today's visit shows normal sinus rhythm with rate 86 bpm, no significant ST or T-wave changes   other past medical history  Echocardiogram was essentially normal with normal ejection fraction, normal biventricular systolic pressures.  Total cholesterol 160. Tolerating Lipitor 20 mg daily    No Known Allergies  Outpatient Encounter Prescriptions as of 08/01/2014  Medication Sig  . aspirin 81 MG tablet Take 81 mg by mouth daily.  Marland Kitchen atorvastatin (LIPITOR) 20 MG tablet TAKE 1 TABLET BY MOUTH DAILY.  . Multiple Vitamin (MULTIVITAMIN) tablet Take 1 tablet by mouth daily.  . verapamil (CALAN) 40 MG tablet Take 1 tablet (40 mg total) by mouth 3 (three) times daily as needed.  . verapamil (CALAN-SR) 240 MG CR tablet TAKE 1 TABLET (240 MG TOTAL) BY MOUTH DAILY.  . [DISCONTINUED] verapamil (CALAN) 40 MG tablet Take 1 tablet (40 mg total) by mouth 3 (three) times daily as needed.    Past Medical History  Diagnosis Date  . Hypertension   . Hyperlipidemia   . Tobacco abuse   .  Hyperglycemia   . Allergy   . GERD (gastroesophageal reflux disease)   . SVT (supraventricular tachycardia)     2013    Past Surgical History  Procedure Laterality Date  . Shoulder surgery Right 1985, 1991, 2000    left x3  . Laparoscopic cholecystectomy  1991    Social History  reports that he quit smoking about 2 weeks ago. His smoking use included Cigarettes. He has a 12.5 pack-year smoking history. He has never used smokeless tobacco. He reports that he drinks about 1.2 oz of alcohol per week. He reports that he does not use illicit drugs.  Family History family history includes Hypertension in his father and mother. There is no history of Colon cancer.   Review of Systems  Constitutional: Negative.   HENT:       Nasal congestion, sinus pressure  Respiratory: Negative.   Cardiovascular: Negative.   Gastrointestinal: Negative.   Musculoskeletal: Negative.   Skin: Negative.   Neurological: Negative.   Hematological: Negative.   Psychiatric/Behavioral: Negative.   All other systems reviewed and are negative.   BP 110/70 mmHg  Pulse 86  Ht 5\' 9"  (1.753 m)  Wt 264 lb 4 oz (119.863 kg)  BMI 39.01 kg/m2  Physical Exam  Constitutional: He is oriented to person, place, and time. He appears well-developed and well-nourished.  HENT:  Head: Normocephalic.  Nose: Nose normal.  Mouth/Throat: Oropharynx is clear and moist.  Eyes: Conjunctivae are  normal. Pupils are equal, round, and reactive to light.  Neck: Normal range of motion. Neck supple. No JVD present.  Cardiovascular: Normal rate, regular rhythm, S1 normal, S2 normal, normal heart sounds and intact distal pulses.  Exam reveals no gallop and no friction rub.   No murmur heard. Pulmonary/Chest: Effort normal and breath sounds normal. No respiratory distress. He has no wheezes. He has no rales. He exhibits no tenderness.  Abdominal: Soft. Bowel sounds are normal. He exhibits no distension. There is no tenderness.   Musculoskeletal: Normal range of motion. He exhibits no edema or tenderness.  Lymphadenopathy:    He has no cervical adenopathy.  Neurological: He is alert and oriented to person, place, and time. Coordination normal.  Skin: Skin is warm and dry. No rash noted. No erythema.  Psychiatric: He has a normal mood and affect. His behavior is normal. Judgment and thought content normal.      Assessment and Plan   Nursing note and vitals reviewed.

## 2014-08-01 NOTE — Assessment & Plan Note (Signed)
We have encouraged him to continue to work on weaning his cigarettes and smoking cessation. He will continue to work on this and does not want any assistance with chantix.  

## 2014-08-01 NOTE — Assessment & Plan Note (Signed)
Blood pressure is well controlled on today's visit. No changes made to the medications. 

## 2014-08-01 NOTE — Assessment & Plan Note (Signed)
Cholesterol is at goal on the current lipid regimen. No changes to the medications were made.  

## 2014-08-01 NOTE — Assessment & Plan Note (Signed)
No recent arrhythmia. We'll continue on current dose of verapamil. Short acting verapamil represcribed

## 2014-08-01 NOTE — Patient Instructions (Signed)
You are doing well. No medication changes were made.  Please call us if you have new issues that need to be addressed before your next appt.  Your physician wants you to follow-up in: 12 months.  You will receive a reminder letter in the mail two months in advance. If you don't receive a letter, please call our office to schedule the follow-up appointment. 

## 2014-08-01 NOTE — Assessment & Plan Note (Signed)
We have encouraged continued exercise, careful diet management in an effort to lose weight. 

## 2014-09-01 NOTE — H&P (Signed)
PATIENT NAME:  Jose Hayes, Jose Hayes MR#:  [id number] DATE OF BIRTH:  [birth year]  DATE OF ADMISSION:  11/05/2011  PRIMARY CARE PHYSICIAN: None  ED REFERRING PHYSICIAN: Dr. Ulice Brilliant  CHIEF COMPLAINT: Palpitations, nausea.   HISTORY OF PRESENT ILLNESS: Patient is a 54 year old Caucasian male with no significant medical history who presents with severe episode of having chest palpitations. He felt his heart was beating very fast. He was at home planning to go to work when this started. Patient also had associated nausea. He did not have any dizziness or syncope. He denies any chest pain. He did not have any shortness of breath. Patient reports that he has never had similar episode in the past. He does report drinking about three cups of coffee, 8 ounces, a day. Otherwise denies any dyspnea on exertion. No chest pain on exertion. No orthopnea or nocturnal dyspnea.   PAST MEDICAL HISTORY: None.   PAST SURGICAL HISTORY:  1. Left shoulder surgery x3.  2. Status post cholecystectomy.   ALLERGIES: No allergies.   CURRENT MEDICATIONS: Aspirin 81, 1 tab p.o. daily.   SOCIAL HISTORY: Smokes 1 pack per day for a total of 25 years. Alcohol-Drinks socially. No drug use.   FAMILY HISTORY: Mother and father had high blood pressure.    REVIEW OF SYSTEMS: CONSTITUTIONAL: Denies any fevers. No fatigue. No weakness. No pain. No weight loss. No weight gain. EYES: No blurred or double vision. No redness. No inflammation. No glaucoma. No cataracts. ENT: No tinnitus. No ear pain. No hearing loss. No seasonal or year-round allergies. No difficulty swallowing. RESPIRATORY: No cough. No wheezing. No hemoptysis. No chronic obstructive pulmonary disease. No tuberculosis. CARDIOVASCULAR: No chest pain. No orthopnea. No edema. Palpitation today as above. No syncope. No high blood pressure. GASTROINTESTINAL: No nausea, vomiting, diarrhea. No abdominal pain. No hematemesis. No melena. No ulcers. GENITOURINARY: Denies any  dysuria, hematuria, renal calculus, or frequency. ENDO: Denies any polyuria, nocturia, or thyroid problems. HEME/LYMPH: Denies any anemia, easy bruisability, or bleeding. SKIN: No acne. No rash. No changes in mole, hair or skin. MUSCULOSKELETAL: Denies any pain in neck, back, or shoulder. NEURO: No numbness. No cerebrovascular accident. No transient ischemic attack. PSYCHIATRIC: No anxiety. No insomnia. No ADD.   PHYSICAL EXAMINATION: VITAL SIGNS: Temperature 98.4, pulse 155 on arrival, respirations 16, blood pressure 135/74, O2 96% on room air.   GENERAL: Currently patient is comfortable in no acute distress.   HEENT: Head atraumatic, normocephalic. Pupils equally round, reactive to light and accommodation. There is no conjunctival pallor. No scleral icterus. Nasal exam shows no drainage or ulceration. Oropharynx is clear without any exudates.   NECK: No thyromegaly. No carotid bruits.   CARDIOVASCULAR: Regular rate and rhythm. No murmurs, rubs, clicks, or gallops. PMI is not displaced.   LUNGS: Clear to auscultation bilaterally without any rales, rhonchi, wheezing.   ABDOMEN: Soft, nontender, nondistended. Positive bowel sounds x4.   EXTREMITIES: No clubbing, cyanosis, edema.   SKIN: No rash.   LYMPHATICS: No lymph nodes palpable.   VASCULAR: Good DP, PT pulses.   PSYCHIATRIC: Not anxious or depressed.   NEUROLOGICAL: Awake, alert, oriented x3. No focal deficits.   LABORATORY, DIAGNOSTIC AND RADIOLOGICAL DATA: Glucose 157, BUN 16, creatinine 0.87, sodium 141, potassium 3.8, chloride 106, CO2 25, calcium 8.7, magnesium 2.0. LFTs were normal except a slightly elevated AST of 46. CPK 259, CK-MB 2.0, troponin 0.02. TSH 1.67, WBC 7.5, hemoglobin 15.6, platelet count 252.   ASSESSMENT AND PLAN: Patient is a 54 year old white male with  no significant medical history presents with heart racing fast associated with nausea, noted to be in supraventricular tachycardia.  1. Supraventricular  tachycardia. At this time will go ahead and start patient on Lopressor. He is advised to cut down on his coffee intake. Will have cardiology just eyeball him to establish continuity. Will get an echocardiogram. His TSH has already been checked and is normal. Will monitor him on tele 2A in case if he needs further adenosine he will not need to be moved from another floor.  2. Leukocytosis. No history of diabetes. Will check a hemoglobin A1c.   3. Nicotine addiction.  4. Minimally elevated liver function tests of unclear significance. Could possibly have fatty liver.  5. Miscellaneous. Will place him on Lovenox for deep vein thrombosis prophylaxis.   TIME SPENT: 35 minutes.  ____________________________ Lafonda Mosses Posey Pronto, MD shp:cms D: 11/05/2011 12:35:19 ET T: 11/05/2011 13:20:59 ET JOB#: [id number]  cc: Teja Judice H. Posey Pronto, MD, <Dictator> Alric Seton MD ELECTRONICALLY SIGNED 11/05/2011 17:08

## 2014-09-01 NOTE — Consult Note (Signed)
General Aspect 54 year old Caucasian male with h/o of smoking, HTN, presents with severe episode of   chest palpitations. Cardiology was consulted for arrhythmia.  He felt his heart was beating very fast starting at 10 AM.  He was at home planning to go to work when this started. Patient also had associated nausea. He did not have any dizziness or syncope. He denies any chest pain or shortness of breath. Patient reports that he has never had similar episode in the past. He does report drinking about three cups of coffee, 8 ounces, a day. Otherwise denies any dyspnea on exertion. No chest pain on exertion. No orthopnea or nocturnal dyspnea.   PAST MEDICAL HISTORY:  --Smoking, since age 12 --HTN --Borderline elevated cholesterol  PAST SURGICAL HISTORY:  1. Left shoulder surgery x3.  2. Status post cholecystectomy.   ALLERGIES: No allergies.   CURRENT MEDICATIONS: Aspirin 81, 1 tab p.o. daily.   SOCIAL HISTORY:  Smokes 1 pack per day for a total of 25 years. Alcohol-Drinks socially. No drug use.   FAMILY HISTORY:  Mother and father had high blood pressure.   No CAD   Physical Exam:   GEN well developed, well nourished, no acute distress    HEENT red conjunctivae    NECK supple  No masses    RESP normal resp effort  clear BS    CARD Regular rate and rhythm  No murmur    ABD denies tenderness  soft    LYMPH negative neck    EXTR negative edema    SKIN normal to palpation    NEURO cranial nerves intact, motor/sensory function intact    PSYCH alert, A+O to time, place, person, good insight   Review of Systems:   Subjective/Chief Complaint palpitations    General: No Complaints    Skin: No Complaints    ENT: No Complaints    Eyes: No Complaints    Neck: No Complaints    Respiratory: No Complaints    Cardiovascular: Palpitations    Gastrointestinal: No Complaints    Genitourinary: No Complaints    Vascular: No Complaints    Musculoskeletal: No  Complaints    Neurologic: No Complaints    Hematologic: No Complaints    Endocrine: No Complaints    Psychiatric: No Complaints    Review of Systems: All other systems were reviewed and found to be negative    Medications/Allergies Reviewed Medications/Allergies reviewed        Admit Diagnosis:   SUPRAVENTRICULAR TACHYCARDIA: 05-Nov-2011, Active, SUPRAVENTRICULAR TACHYCARDIA  Home Medications: Medication Instructions Status  aspirin 81 mg oral tablet 1 tab(s) orally once a day Active   Lab Results:  Thyroid:  28-Jun-13 10:51    Thyroid Stimulating Hormone 1.67 (0.45-4.50 (International Unit)  ----------------------- Pregnant patients have  different reference  ranges for TSH:  - - - - - - - - - -  Pregnant, first trimetser:  0.36 - 2.50 uIU/mL)  Hepatic:  28-Jun-13 10:51    Bilirubin, Total 0.4   Alkaline Phosphatase 84   SGPT (ALT) 74 (12-78 NOTE: NEW REFERENCE RANGE 04/02/2011)   SGOT (AST)  46   Total Protein, Serum 7.7   Albumin, Serum 4.2  Routine Chem:  28-Jun-13 10:51    Hemoglobin A1c ([facility]) 5.8 (The American Diabetes Association recommends that a primary goal of therapy should be <7% and that physicians should reevaluate the treatment regimen in patients with HbA1c values consistently >8%.)   Magnesium, Serum 2.0 (1.8-2.4 THERAPEUTIC RANGE: 4-7 mg/dL  TOXIC: > 10 mg/dL  -----------------------)   Glucose, Serum  157   BUN 16   Creatinine (comp) 0.87   Sodium, Serum 141   Potassium, Serum 3.8   Chloride, Serum 106   CO2, Serum 25   Calcium (Total), Serum 8.7   Osmolality (calc) 286   eGFR (African American) >60   eGFR (Non-African American) >60 (eGFR values <60mL/min/1.73 m2 may be an indication of chronic kidney disease (CKD). Calculated eGFR is useful in patients with stable renal function. The eGFR calculation will not be reliable in acutely ill patients when serum creatinine is changing rapidly. It is not useful in  patients on dialysis.  The eGFR calculation may not be applicable to patients at the low and high extremes of body sizes, pregnant women, and vegetarians.)   Anion Gap 10  Cardiac:  28-Jun-13 10:51    CK, Total  259   CPK-MB, Serum 2.0 (Result(s) reported on 05 Nov 2011 at 11:37AM.)   Troponin I 0.02 (0.00-0.05 0.05 ng/mL or less: NEGATIVE  Repeat testing in 3-6 hrs  if clinically indicated. >0.05 ng/mL: POTENTIAL  MYOCARDIAL INJURY. Repeat  testing in 3-6 hrs if  clinically indicated. NOTE: An increase or decrease  of 30% or more on serial  testing suggests a  clinically important change)  Routine Hem:  28-Jun-13 10:51    WBC (CBC) 7.5   RBC (CBC) 5.24   Hemoglobin (CBC) 15.6   Hematocrit (CBC) 46.4   Platelet Count (CBC) 252   MCV 89   MCH 29.8   MCHC 33.7   RDW 13.9   Neutrophil % 53.7   Lymphocyte % 33.8   Monocyte % 9.1   Eosinophil % 2.7   Basophil % 0.7   Neutrophil # 4.0   Lymphocyte # 2.5   Monocyte # 0.7   Eosinophil # 0.2   Basophil # 0.1 (Result(s) reported on 05 Nov 2011 at 11:28AM.)   EKG:   Interpretation EKG shows SVT with rate 160 bpm    No Known Allergies:   Vital Signs/Nurse's Notes: **Vital Signs.:   28-Jun-13 15:31   Vital Signs Type Routine   Temperature Temperature (F) 97.5   Celsius 36.3   Temperature Source Oral   Pulse Pulse 70   Pulse source per vital sign device   Respirations Respirations 18   Systolic BP Systolic BP 133   Diastolic BP (mmHg) Diastolic BP (mmHg) 75   Mean BP 94   Pulse Ox % Pulse Ox % 95   Pulse Ox Activity Level  At rest   Oxygen Delivery 2L     Impression 54-year-old Caucasian male with h/o of smoking, HTN, presents with severe episode of   chest palpitations. Cardiology was consulted for arrhythmia.  1) SVT Broke in the ER.  He reports it did not break with adenosine 6 mg Slowly broke with diltiazem IV No further epsiodes. --Will start diltiazem 120 mg daily If BP tolerates, could continue diltiazem 120 daily also  d/c with diltiazem 30 mg for PRN breakthrough arrhythmia. --Will review echo --I have given him out contact info at [place] and we will call him for follow up.   2) Smoking Recommended smoking cessation  3) HTN He reports recent HTN Should improve with diltiazem 120 mg daily if tolerated.   Electronic Signatures: Gollan, Timothy (MD)  (Signed 28-Jun-13 19:07)  Authored: General Aspect/Present Illness, History and Physical Exam, Review of System, Health Issues, Home Medications, Labs, EKG , Allergies, Vital Signs/Nurse's Notes, Impression/Plan     Last Updated: 28-Jun-13 19:07 by Ida Rogue (MD)

## 2014-09-01 NOTE — Discharge Summary (Signed)
PATIENT NAME:  Jose Hayes, Jose Hayes MR#:  [id number] DATE OF BIRTH:  09/18/1960  DATE OF ADMISSION:  11/05/2011 DATE OF DISCHARGE:  11/06/2011  PRIMARY CARE PHYSICIAN: Dr. Silvio Pate   REASON FOR ADMISSION: Heart palpitations and nausea.   DISCHARGE DIAGNOSES:  1. Supraventricular tachycardia leading to heart palpitations and nausea, resolved. Patient in normal sinus rhythm at the time of discharge. Supraventricular tachycardia felt to be due to excessive caffeine consumption.  2. Hypercholesterolemia. 3. Hyperglycemia, stress induced.  4. Hypertension.  5. Tobacco abuse.  6. Diastolic dysfunction with normal left ventricular ejection fraction greater than 55% per two dimensional echocardiogram 11/05/2011.   CONSULTATION: Cardiology, Dr. Rockey Situ.   DISCHARGE DISPOSITION: Home.   DISCHARGE MEDICATIONS:  1. Cardizem CD 120 mg p.o. daily.  2. Cardizem 30 mg p.o. once p.r.n. heart palpitations associated with heart rate over 110 and notify your cardiologist immediately thereafter.  3. Aspirin 81 mg daily.   DISCHARGE CONDITION: Improved, stable.  DISCHARGE ACTIVITY: As tolerated.   DISCHARGE DIET: Low sodium, low fat, low cholesterol and avoid excessive caffeine intake and cut back on the amount of caffeine that you have been taking prior to coming into the hospital.   DISCHARGE INSTRUCTIONS:  1. Take medications as prescribed.  2. Return to Emergency Department for recurrence of symptoms, for development of chest pain, heart palpitations, shortness of breath or nausea or vomiting.  3. Return to Emergency Department for dizziness, lightheadedness, weakness or feeling faint. 4. Return to Emergency Department for any bleeding complications.    FOLLOW UP INSTRUCTIONS:  1. Follow up with [facility] within 1 to 2 weeks.  2. Follow up with Dr. Silvio Pate within 1 to 2 weeks.   LABORATORY, DIAGNOSTIC AND RADIOLOGICAL DATA: Portable chest x-ray on admission: No acute cardiopulmonary  abnormalities are noted.   2-D echocardiogram 80/16/5537: LV systolic function normal. Ejection fraction greater than 55%. Transmitral spectral Doppler flow pattern is suggestive of impaired LV relaxation, mild concentric left ventricular hypertrophy. Right ventricular systolic function. Right ventricular systolic pressure is normal.    EKG on arrival revealed supraventricular tachycardia with a heart rate 146 beats per minute with rightward axis and incomplete right bundle branch block.   EKG after adenosine was administered 11/05/2011 revealed normal sinus rhythm, heart rate 96 beats per minute with right axis deviation, rightward axis and incomplete right bundle branch block.   Cardiac enzymes normal on admission except for CK 259. Repeat CK was 189 seconds set, third CK was 168. CK-MB and troponin levels were normal x3 sets.   TSH 1.67.   CBC normal on admission.  Complete metabolic panel normal on admission except for serum glucose 157.   Fasting a.m. glucose 11/06/2011 was 93.  Hemoglobin A1c 5.8.  Lipid panel: Total cholesterol 191, triglycerides 107, HDL 41, LDL 129.   AST was 46 on admission and 31 on the day of discharge.  BRIEF HISTORY/HOSPITAL COURSE: Patient is a pleasant 54 year old male with past medical history of hypertension and borderline hypercholesterolemia as well as tobacco abuse who presented to the Emergency Department with complaints of heart palpitations and nausea. Please see dictated admission history and physical for pertinent details surrounding the onset of this hospitalization and please see below for further details.  1. Heart palpitations and nausea-Due to SVT which is evident on EKG obtained at the time of arrival to the ER. In the ER patient received a dose of adenosine and thereafter went from SVT to sinus tachycardia and thereafter was started on calcium channel blocker  for heart rate control. Thereafter patient was admitted to the hospital. He was  maintained on calcium channel blocker for heart rate control. Since being admitted to the hospital patient has persistently remained in normal sinus rhythm. After he converted back to normal sinus rhythm his heart palpitations and nausea had resolved and have not recurred since. Cardiology was consulted and Dr. Rockey Situ recommended starting patient on oral calcium channel blocker for heart rate control which patient will continue as an outpatient. Patient will be discharged home on long-acting Cardizem once a day. Dr. Rockey Situ also recommended giving the patient a prescription for short-acting Cardizem 30 mg p.o. to be taken on a p.r.n. basis once as needed for redevelopment of heart palpitations and patient was also advised that if he develops heart palpitations and ends up taking short-acting Cardizem he should notify his cardiologist immediately. Patient was advised to continue aspirin for stroke and myocardial infarction prophylaxis. SVT was felt to be due to excessive caffeine consumption/intake. Patient has been advised to cut back and limit his caffeine intake. Telemetry monitoring during this hospital admission has revealed persistent normal sinus rhythm. At the time of discharge patient remains in normal sinus rhythm and is asymptomatic and without any chest pain or heart palpitations or nausea. He would need to continue Cardizem as an outpatient. Will follow up with [facility] as an outpatient. He has history of borderline hypercholesterolemia and LDL during this hospitalization was 129 and patient wishes to be maintained on diet and exercise control for now before being started on any medications to help reduce his cholesterol levels. Therefore he has been advised adequate diet and exercise control. He was noted to have some hyperglycemia which was felt to be stress induced and there is currently no evidence of diabetes mellitus at this time. Hemoglobin A1c 5.8 and fasting a.m. blood glucose on  11/06/2011 was within normal limits.   2. In regards to hypertension, blood pressure is well controlled the time of discharge and patient is to continue Cardizem.  3. For tobacco abuse patient was strongly counseled on the importance of smoking cessation.  4. Elevated AST-Felt to be more muscular in etiology from elevated CK level which was isolated and only noted to be elevated on one set of cardiac enzymes and this was felt to be from cardiac strain caused by tachycardia and his troponins have remained negative and he has been without any chest pain during this hospitalization. In regards to his elevated AST, he denied any abdominal pain, nausea or vomiting. As his CK levels have normalized so has his AST level at the time of discharge.  5. On 11/06/2011 when I rounded on the patient he was asymptomatic and without any heart palpitations or nausea and he was in a normal sinus rhythm with controlled heart rate and was felt to be stable for discharge home with close outpatient follow up to which patient was agreeable and it was also felt to be safe from a cardiology standpoint to have the patient discharged home.   TIME SPENT ON DISCHARGE: Greater than 30 minutes.   ____________________________ Romie Jumper, MD knl:cms D: 11/10/2011 17:37:27 ET T: 11/12/2011 11:33:56 ET JOB#: [id number]  cc: Romie Jumper, MD, <Dictator> Venia Carbon, MD [facility] Romie Jumper MD ELECTRONICALLY SIGNED 11/19/2011 17:25

## 2015-02-10 ENCOUNTER — Emergency Department: Payer: BLUE CROSS/BLUE SHIELD

## 2015-02-10 ENCOUNTER — Encounter: Payer: Self-pay | Admitting: Emergency Medicine

## 2015-02-10 ENCOUNTER — Emergency Department
Admission: EM | Admit: 2015-02-10 | Discharge: 2015-02-10 | Disposition: A | Discharge status: Home / Self Care | Payer: BLUE CROSS/BLUE SHIELD | Attending: Emergency Medicine | Admitting: Emergency Medicine

## 2015-02-10 ENCOUNTER — Telehealth: Payer: Self-pay | Admitting: Cardiovascular Disease

## 2015-02-10 ENCOUNTER — Other Ambulatory Visit: Payer: Self-pay | Admitting: *Deleted

## 2015-02-10 DIAGNOSIS — I10 Essential (primary) hypertension: Secondary | ICD-10-CM | POA: Diagnosis not present

## 2015-02-10 DIAGNOSIS — Z79899 Other long term (current) drug therapy: Secondary | ICD-10-CM | POA: Insufficient documentation

## 2015-02-10 DIAGNOSIS — Z87891 Personal history of nicotine dependence: Secondary | ICD-10-CM | POA: Insufficient documentation

## 2015-02-10 DIAGNOSIS — Z7982 Long term (current) use of aspirin: Secondary | ICD-10-CM | POA: Insufficient documentation

## 2015-02-10 DIAGNOSIS — M79661 Pain in right lower leg: Secondary | ICD-10-CM | POA: Insufficient documentation

## 2015-02-10 DIAGNOSIS — M79604 Pain in right leg: Secondary | ICD-10-CM | POA: Diagnosis present

## 2015-02-10 MED ORDER — ATORVASTATIN CALCIUM 20 MG PO TABS: 20.0000 mg | ORAL_TABLET | Freq: Every day | ORAL | Status: DC

## 2015-02-10 NOTE — Discharge Instructions (Signed)
Musculoskeletal Pain Musculoskeletal pain is muscle and boney aches and pains. These pains can occur in any part of the body. Your caregiver may treat you without knowing the cause of the pain. They may treat you if blood or urine tests, X-rays, and other tests were normal.  CAUSES There is often not a definite cause or reason for these pains. These pains may be caused by a type of germ (virus). The discomfort may also come from overuse. Overuse includes working out too hard when your body is not fit. Boney aches also come from weather changes. Bone is sensitive to atmospheric pressure changes. HOME CARE INSTRUCTIONS   Ask when your test results will be ready. Make sure you get your test results.  Only take over-the-counter or prescription medicines for pain, discomfort, or fever as directed by your caregiver. If you were given medications for your condition, do not drive, operate machinery or power tools, or sign legal documents for 24 hours. Do not drink alcohol. Do not take sleeping pills or other medications that may interfere with treatment.  Continue all activities unless the activities cause more pain. When the pain lessens, slowly resume normal activities. Gradually increase the intensity and duration of the activities or exercise.  During periods of severe pain, bed rest may be helpful. Lay or sit in any position that is comfortable.  Putting ice on the injured area.  Put ice in a bag.  Place a towel between your skin and the bag.  Leave the ice on for 15 to 20 minutes, 3 to 4 times a day.  Follow up with your caregiver for continued problems and no reason can be found for the pain. If the pain becomes worse or does not go away, it may be necessary to repeat tests or do additional testing. Your caregiver may need to look further for a possible cause. SEEK IMMEDIATE MEDICAL CARE IF:  You have pain that is getting worse and is not relieved by medications.  You develop chest pain  that is associated with shortness or breath, sweating, feeling sick to your stomach (nauseous), or throw up (vomit).  Your pain becomes localized to the abdomen.  You develop any new symptoms that seem different or that concern you. MAKE SURE YOU:   Understand these instructions.  Will watch your condition.  Will get help right away if you are not doing well or get worse. Document Released: 04/26/2005 Document Revised: 07/19/2011 Document Reviewed: 12/29/2012 [facility] Patient Information 2015 [place], [place]. This information is not intended to replace advice given to you by your health care provider. Make sure you discuss any questions you have with your health care provider.  Your ultrasound did not reveal any sign of blood clot or deep vein thrombus (DVT). You should continue to monitor symptoms, and follow-up with your provider as needed.

## 2015-02-10 NOTE — Telephone Encounter (Signed)
[facility]   1. Which medications need to be refilled? lipitor 20 mg po daily   2. Which pharmacy is medication to be sent to? [facility]   3. Do they need a 30 day or 90 day supply? 90  4. Would they like a call back once the medication has been sent to the pharmacy? No

## 2015-02-10 NOTE — ED Notes (Signed)
States R calf pain today, denies injury, states similar episode 3 weeks ago for a couple days, thought it was muscle cramp

## 2015-02-10 NOTE — ED Notes (Signed)
U/s negative for DVT; pt moved to flex wait

## 2015-02-11 NOTE — ED Provider Notes (Signed)
[facility] Emergency Department Provider Note ____________________________________________  Time seen: 2107  I have reviewed the triage vital signs and the nursing notes.  HISTORY  Chief Complaint  Leg Pain  HPI Jose Hayes is a 54 y.o. male reports to the ED for evaluation via ultrasound for right calf pain today. He denies any recent injury, but does note similar episode 3 weeks ago which lasted for a couple days. He assumes symptoms were due to a muscle cramp, localizing the pain to the middle calf on the right leg. He denies any distal paresthesias, fever chills sweats, or chest pain. He also is without any bruising, swelling, or dysfunction the calf. He was initially evaluated today while local urgent care, and advised to report to the ED for an ultrasound to rule out a DVT. He reports here for that diagnostic test. He has been dosing his primary care provider tomorrow. He rates his pain at a 7/10 in triage.  Past Medical History  Diagnosis Date  . Hypertension   . Hyperlipidemia   . Tobacco abuse   . Hyperglycemia   . Allergy   . GERD (gastroesophageal reflux disease)   . SVT (supraventricular tachycardia) ([place])     2013    Patient Active Problem List   Diagnosis Date Noted  . Hyperlipidemia 12/01/2011  . SVT (supraventricular tachycardia) ([place]) 12/01/2011  . Smoking 12/01/2011  . Obesity 12/01/2011  . Hypertension 12/01/2011    Past Surgical History  Procedure Laterality Date  . Shoulder surgery Right 1985, 1991, 2000    left x3  . Laparoscopic cholecystectomy  1991    Current Outpatient Rx  Name  Route  Sig  Dispense  Refill  . aspirin 81 MG tablet   Oral   Take 81 mg by mouth daily.         Marland Kitchen atorvastatin (LIPITOR) 20 MG tablet   Oral   Take 1 tablet (20 mg total) by mouth daily.   90 tablet   3   . Multiple Vitamin (MULTIVITAMIN) tablet   Oral   Take 1 tablet by mouth daily.         . verapamil (CALAN) 40 MG  tablet   Oral   Take 1 tablet (40 mg total) by mouth 3 (three) times daily as needed.   90 tablet   3   . verapamil (CALAN-SR) 240 MG CR tablet      TAKE 1 TABLET (240 MG TOTAL) BY MOUTH DAILY.   90 tablet   3    Allergies Review of patient's allergies indicates no known allergies.  Family History  Problem Relation Age of Onset  . Hypertension Mother   . Hypertension Father   . Colon cancer Neg Hx    Social History Social History  Substance Use Topics  . Smoking status: Former Smoker -- 0.50 packs/day for 25 years    Types: Cigarettes    Quit date: 07/18/2014  . Smokeless tobacco: Never Used  . Alcohol Use: 1.2 oz/week    2 Cans of beer per week   Review of Systems  Constitutional: Negative for fever. Eyes: Negative for visual changes. ENT: Negative for sore throat. Cardiovascular: Negative for chest pain. Respiratory: Negative for shortness of breath. Gastrointestinal: Negative for abdominal pain, vomiting and diarrhea. Genitourinary: Negative for dysuria. Musculoskeletal: Negative for back pain. Right calf pain as above. Skin: Negative for rash. Neurological: Negative for headaches, focal weakness or numbness. ____________________________________________  PHYSICAL EXAM:  VITAL SIGNS: ED Triage Vitals  Enc Vitals Group     BP 02/10/15 1758 147/80 mmHg     Pulse Rate 02/10/15 1758 77     Resp 02/10/15 1758 18     Temp 02/10/15 1758 98.5 F (36.9 C)     Temp Source 02/10/15 1758 Oral     SpO2 02/10/15 1758 94 %     Weight 02/10/15 1758 260 lb (117.935 kg)     Height 02/10/15 1758 5\' 9"  (1.753 m)     Head Cir --      Peak Flow --      Pain Score 02/10/15 1759 7     Pain Loc --      Pain Edu? --      Excl. in [place]? --    Constitutional: Alert and oriented. Well appearing and in no distress. Eyes: Conjunctivae are normal. PERRL. Normal extraocular movements. ENT   Head: Normocephalic and atraumatic.   Nose: No congestion/rhinorrhea.    Mouth/Throat: Mucous membranes are moist.   Neck: Supple. No thyromegaly. Hematological/Lymphatic/Immunological: No cervical lymphadenopathy. Cardiovascular: Normal rate, regular rhythm.  Respiratory: Normal respiratory effort. No wheezes/rales/rhonchi. Gastrointestinal: Soft and nontender. No distention. Musculoskeletal: Nontender with normal range of motion in all extremities.  Neurologic:  Normal gait without ataxia. Normal speech and language. No gross focal neurologic deficits are appreciated. Skin:  Skin is warm, dry and intact. No rash noted. Psychiatric: Mood and affect are normal. Patient exhibits appropriate insight and judgment. ____________________________________________   RADIOLOGY RLE Venous Doppler US IMPRESSION: No evidence of deep venous thrombosis. ___________________________________________  INITIAL IMPRESSION / ASSESSMENT AND PLAN / ED COURSE  Patient advised of ultrasound findings. He is reassured about the low risk of a blood clot at this time. Symptoms likely represent a muscle strain or small muscle tear to the calf. He is advised to follow with his primary care provider as planned, or see Dr. Truitt Leep as C. difficile ongoing symptoms. ____________________________________________  FINAL CLINICAL IMPRESSION(S) / ED DIAGNOSES  Final diagnoses:  Calf pain, right      Melvenia Needles, PA-C 02/11/15 0051  Daymon Larsen, MD 02/11/15 [phone]

## 2015-04-17 ENCOUNTER — Other Ambulatory Visit: Payer: Self-pay | Admitting: Cardiovascular Disease

## 2015-09-04 ENCOUNTER — Encounter: Payer: Self-pay | Admitting: Cardiovascular Disease

## 2015-09-04 ENCOUNTER — Encounter (INDEPENDENT_AMBULATORY_CARE_PROVIDER_SITE_OTHER): Payer: Self-pay

## 2015-09-04 ENCOUNTER — Ambulatory Visit (INDEPENDENT_AMBULATORY_CARE_PROVIDER_SITE_OTHER): Payer: BLUE CROSS/BLUE SHIELD | Admitting: Cardiovascular Disease

## 2015-09-04 VITALS — BP 120/80 | HR 73 | Ht 69.0 in | Wt 265.4 lb

## 2015-09-04 DIAGNOSIS — I471 Supraventricular tachycardia: Secondary | ICD-10-CM | POA: Diagnosis not present

## 2015-09-04 DIAGNOSIS — I10 Essential (primary) hypertension: Secondary | ICD-10-CM | POA: Diagnosis not present

## 2015-09-04 DIAGNOSIS — G2581 Restless legs syndrome: Secondary | ICD-10-CM | POA: Insufficient documentation

## 2015-09-04 DIAGNOSIS — E785 Hyperlipidemia, unspecified: Secondary | ICD-10-CM

## 2015-09-04 DIAGNOSIS — R0602 Shortness of breath: Secondary | ICD-10-CM

## 2015-09-04 DIAGNOSIS — Z720 Tobacco use: Secondary | ICD-10-CM | POA: Diagnosis not present

## 2015-09-04 DIAGNOSIS — F17200 Nicotine dependence, unspecified, uncomplicated: Secondary | ICD-10-CM

## 2015-09-04 NOTE — Patient Instructions (Signed)
You are doing well. No medication changes were made.  Please call us if you have new issues that need to be addressed before your next appt.  Your physician wants you to follow-up in: 12 months.  You will receive a reminder letter in the mail two months in advance. If you don't receive a letter, please call our office to schedule the follow-up appointment. 

## 2015-09-04 NOTE — Assessment & Plan Note (Signed)
Blood pressure is well controlled on today's visit. No changes made to the medications. 

## 2015-09-04 NOTE — Assessment & Plan Note (Signed)
Previous cholesterol 160, we have requested he try to obtain lab work from work for our system

## 2015-09-04 NOTE — Assessment & Plan Note (Signed)
Reports having 1-2 episodes per week, improves if he gets in a hot shower  not on medications at this time

## 2015-09-04 NOTE — Assessment & Plan Note (Signed)
Stopped smoking over one year ago,  recommended continued cessation

## 2015-09-04 NOTE — Progress Notes (Signed)
Patient ID: Jose Hayes, male    DOB: [birth year], 55 y.o.   MRN: [id number]  HPI Comments: Jose Hayes is a very pleasant 54 year-old gentleman with obesity, smoking history who presented to [facility] 11/05/2011 with SVT. Rhythm broke to normal sinus rhythm in the emergency room. Adenosine did not work. He presents for routine followup of his arrhythmia  In follow-up today, he reports that he is doing well Continues to work in Engineer, technical sales at [employer]. No regular exercise program,  Trying to watch his diet, not losing as much weight as he would like  working long hours, goes to bed late, wakes up at 5:30.  Spends most of his weekends taking care of his daughter who is a Engineer, manufacturing, 55 years old.  Has not smoked in 1.5 years  Denies having significant snoring or apnea.  Hobbies include making guitars blood pressure typically well controlled  EKG on today's visit shows normal sinus rhythm with rate 59 bpm, no significant ST or T-wave changes   other past medical history  Echocardiogram was essentially normal with normal ejection fraction, normal biventricular systolic pressures.  Total cholesterol 160. Tolerating Lipitor 20 mg daily Reports having blood work done through his doctor at work    No Known Allergies  Outpatient Encounter Prescriptions as of 09/04/2015  Medication Sig  . aspirin 81 MG tablet Take 81 mg by mouth daily.  Marland Kitchen atorvastatin (LIPITOR) 20 MG tablet Take 1 tablet (20 mg total) by mouth daily.  . meloxicam (MOBIC) 15 MG tablet Take 15 mg by mouth daily. with food  . Multiple Vitamin (MULTIVITAMIN) tablet Take 1 tablet by mouth daily.  . verapamil (CALAN-SR) 240 MG CR tablet TAKE 1 TABLET (240 MG TOTAL) BY MOUTH DAILY.  . [DISCONTINUED] verapamil (CALAN) 40 MG tablet Take 1 tablet (40 mg total) by mouth 3 (three) times daily as needed.  . [DISCONTINUED] verapamil (CALAN-SR) 240 MG CR tablet TAKE 1 TABLET (240 MG TOTAL) BY MOUTH DAILY.   No facility-administered  encounter medications on file as of 09/04/2015.    Past Medical History  Diagnosis Date  . Hypertension   . Hyperlipidemia   . Tobacco abuse   . Hyperglycemia   . Allergy   . GERD (gastroesophageal reflux disease)   . SVT (supraventricular tachycardia) ([place])     2013    Past Surgical History  Procedure Laterality Date  . Shoulder surgery Right 1985, 1991, 2000    left x3  . Laparoscopic cholecystectomy  1991    Social History  reports that he quit smoking about 13 months ago. His smoking use included Cigarettes. He has a 12.5 pack-year smoking history. He has never used smokeless tobacco. He reports that he drinks about 1.2 oz of alcohol per week. He reports that he does not use illicit drugs.  Family History family history includes Hypertension in his father and mother. There is no history of Colon cancer.   Review of Systems  Constitutional: Negative.   Respiratory: Negative.   Cardiovascular: Negative.   Gastrointestinal: Negative.   Musculoskeletal: Negative.   Neurological: Negative.   Hematological: Negative.   Psychiatric/Behavioral: Negative.   All other systems reviewed and are negative.   BP 120/80 mmHg  Pulse 73  Ht 5\' 9"  (1.753 m)  Wt 265 lb 6.4 oz (120.385 kg)  BMI 39.17 kg/m2  SpO2 96%  Physical Exam  Constitutional: He is oriented to person, place, and time. He appears well-developed and well-nourished.  Obese  HENT:  Head:  Normocephalic.  Nose: Nose normal.  Mouth/Throat: Oropharynx is clear and moist.  Eyes: Conjunctivae are normal. Pupils are equal, round, and reactive to light.  Neck: Normal range of motion. Neck supple. No JVD present.  Cardiovascular: Normal rate, regular rhythm, S1 normal, S2 normal, normal heart sounds and intact distal pulses.  Exam reveals no gallop and no friction rub.   No murmur heard. Pulmonary/Chest: Effort normal and breath sounds normal. No respiratory distress. He has no wheezes. He has no rales. He exhibits  no tenderness.  Abdominal: Soft. Bowel sounds are normal. He exhibits no distension. There is no tenderness.  Musculoskeletal: Normal range of motion. He exhibits no edema or tenderness.  Lymphadenopathy:    He has no cervical adenopathy.  Neurological: He is alert and oriented to person, place, and time. Coordination normal.  Skin: Skin is warm and dry. No rash noted. No erythema.  Psychiatric: He has a normal mood and affect. His behavior is normal. Judgment and thought content normal.      Assessment and Plan   Nursing note and vitals reviewed.

## 2015-09-04 NOTE — Assessment & Plan Note (Signed)
Recent arrhythmia concerning for SVT. We'll continue current medications

## 2016-04-30 ENCOUNTER — Other Ambulatory Visit: Payer: Self-pay | Admitting: Cardiovascular Disease

## 2016-07-18 ENCOUNTER — Other Ambulatory Visit: Payer: Self-pay | Admitting: Cardiovascular Disease

## 2016-07-19 NOTE — Telephone Encounter (Signed)
Pt needs f/u appt with [facility] for next month. Thanks!

## 2016-07-20 NOTE — Telephone Encounter (Signed)
Lmov for patient to call back and schedule appointment for next month  For refills

## 2016-07-30 NOTE — Telephone Encounter (Signed)
Lmov for patient to call back and schedule appointment for next month  Sent letter For refills

## 2016-08-06 NOTE — Telephone Encounter (Signed)
Third Attempt no answer:  Lmov for patient to call back and schedule appointment for next month

## 2016-08-27 ENCOUNTER — Ambulatory Visit
Admission: RE | Admit: 2016-08-27 | Discharge: 2016-08-27 | Disposition: A | Discharge status: Home / Self Care | Payer: BLUE CROSS/BLUE SHIELD | Source: Ambulatory Visit | Attending: Family Medicine | Admitting: Family Medicine

## 2016-08-27 ENCOUNTER — Other Ambulatory Visit: Payer: Self-pay | Admitting: Family Medicine

## 2016-08-27 DIAGNOSIS — R1084 Generalized abdominal pain: Secondary | ICD-10-CM

## 2016-10-13 ENCOUNTER — Encounter: Payer: Self-pay | Admitting: Physician Assistant

## 2016-10-13 ENCOUNTER — Ambulatory Visit (INDEPENDENT_AMBULATORY_CARE_PROVIDER_SITE_OTHER): Payer: BLUE CROSS/BLUE SHIELD | Admitting: Physician Assistant

## 2016-10-13 ENCOUNTER — Other Ambulatory Visit (INDEPENDENT_AMBULATORY_CARE_PROVIDER_SITE_OTHER): Payer: BLUE CROSS/BLUE SHIELD

## 2016-10-13 VITALS — BP 130/78 | HR 80 | Ht 68.0 in | Wt 254.0 lb

## 2016-10-13 DIAGNOSIS — R938 Abnormal findings on diagnostic imaging of other specified body structures: Secondary | ICD-10-CM

## 2016-10-13 DIAGNOSIS — R1084 Generalized abdominal pain: Secondary | ICD-10-CM | POA: Diagnosis not present

## 2016-10-13 DIAGNOSIS — R9389 Abnormal findings on diagnostic imaging of other specified body structures: Secondary | ICD-10-CM

## 2016-10-13 DIAGNOSIS — R634 Abnormal weight loss: Secondary | ICD-10-CM

## 2016-10-13 DIAGNOSIS — R1310 Dysphagia, unspecified: Secondary | ICD-10-CM

## 2016-10-13 DIAGNOSIS — N200 Calculus of kidney: Secondary | ICD-10-CM

## 2016-10-13 LAB — CBC WITH DIFFERENTIAL/PLATELET
BASOS PCT: 0.8 % (ref 0.0–3.0)
Basophils Absolute: 0.1 10*3/uL (ref 0.0–0.1)
EOS ABS: 0.2 10*3/uL (ref 0.0–0.7)
Eosinophils Relative: 1.9 % (ref 0.0–5.0)
HCT: 40.2 % (ref 39.0–52.0)
Hemoglobin: 13.3 g/dL (ref 13.0–17.0)
LYMPHS ABS: 1.8 10*3/uL (ref 0.7–4.0)
Lymphocytes Relative: 21.8 % (ref 12.0–46.0)
MCHC: 33.1 g/dL (ref 30.0–36.0)
MCV: 83.8 fl (ref 78.0–100.0)
MONO ABS: 0.8 10*3/uL (ref 0.1–1.0)
Monocytes Relative: 9.9 % (ref 3.0–12.0)
NEUTROS PCT: 65.6 % (ref 43.0–77.0)
Neutro Abs: 5.4 10*3/uL (ref 1.4–7.7)
Platelets: 292 10*3/uL (ref 150.0–400.0)
RBC: 4.8 Mil/uL (ref 4.22–5.81)
RDW: 14 % (ref 11.5–15.5)
WBC: 8.3 10*3/uL (ref 4.0–10.5)

## 2016-10-13 LAB — LIPASE: LIPASE: 15 U/L (ref 11.0–59.0)

## 2016-10-13 LAB — COMPREHENSIVE METABOLIC PANEL
ALBUMIN: 4.4 g/dL (ref 3.5–5.2)
ALT: 22 U/L (ref 0–53)
AST: 15 U/L (ref 0–37)
Alkaline Phosphatase: 69 U/L (ref 39–117)
BUN: 16 mg/dL (ref 6–23)
CHLORIDE: 103 meq/L (ref 96–112)
CO2: 31 meq/L (ref 19–32)
CREATININE: 0.96 mg/dL (ref 0.40–1.50)
Calcium: 9.4 mg/dL (ref 8.4–10.5)
GFR: 86.16 mL/min (ref >60.00)
GLUCOSE: 131 mg/dL — AB (ref 70–99)
POTASSIUM: 4.3 meq/L (ref 3.5–5.1)
SODIUM: 139 meq/L (ref 135–145)
Total Bilirubin: 0.4 mg/dL (ref 0.2–1.2)
Total Protein: 7.3 g/dL (ref 6.0–8.3)

## 2016-10-13 LAB — HIGH SENSITIVITY CRP: CRP, High Sensitivity: 30.98 mg/L — ABNORMAL HIGH (ref 0.000–5.000)

## 2016-10-13 LAB — SEDIMENTATION RATE: Sed Rate: 30 mm/hr — ABNORMAL HIGH (ref 0–20)

## 2016-10-13 NOTE — Progress Notes (Signed)
Subjective:    Patient ID: Jose Hayes, male    DOB: 06-11-1960, 56 y.o.   MRN: [id number]  HPI Jose Hayes is a pleasant 56 year old white male, referred today by Jose Hayes for evaluation of recent abdominal pain bloating and weight loss. Patient is known to Jose Hayes from prior procedures and last had colonoscopy in July 2015 for follow-up of adenomatous colon polyps. He was noted to have 2 sessile polyps measuring 3-7 mm, both removed and were tubular adenomas. He was recommended for 5 year interval follow-up. Other medical problems include SVT, hypertension, restless leg syndrome, obesity, hyperlipidemia, he is status post remote cholecystectomy. Patient says his current symptoms started about 6 weeks or so ago with rather generalized abdominal pain. He had some pain in both of his flanks, more notable on the right. He was evaluated by [facility] and had a noncontrasted CT done on 09/07/2016. He was found to have a 4 mm nonobstructing stone in the left kidney, a 2 mm exophytic lesion in the lower pole of left kidney and possibly complicated cyst, there was fullness in the hilum of each kidney left greater than right possibly related to central cyanosis or collecting system distention no hydroureter. He also has mild gastrohepatic retrocrural and retroperitoneal lymphadenopathy further workup warranted to rule out metastatic disease or lymphoma. Patient states that he passed a couple of kidney stones and has been told by the urologist that he is passed all the stones other than 1 which still up in the left kidney. He is now realizes that he is still having rather generalized upper abdominal discomfort in a generalized bloated full sensation in his abdomen. He has had some vague dysphagia symptoms over the past few weeks. He says initially when he starts to eat it feels as if food is lodging in his lower chest. This is not particularly painful but once he gets the food pass this area  he is able to eat without that sensation. Is not had any regurgitation denies any heartburn or indigestion. His weight is down about 25 pounds over the past 4-6 weeks. He is hurting at night frequently and says is having a hard time sleeping due to the upper abdominal discomfort. He has been somewhat more constipated because he is eating less but having fairly regular bowel movements without any melena or hematochezia. He has not had any recent labs.  Review of Systems Pertinent positive and negative review of systems were noted in the above HPI section.  All other review of systems was otherwise negative.  Outpatient Encounter Prescriptions as of 10/13/2016  Medication Sig  . aspirin 81 MG tablet Take 81 mg by mouth daily.  Marland Kitchen atorvastatin (LIPITOR) 20 MG tablet TAKE 1 TABLET (20 MG TOTAL) BY MOUTH DAILY.  Marland Kitchen dicyclomine (BENTYL) 20 MG tablet Take 20 mg by mouth every 6 (six) hours.  . meloxicam (MOBIC) 15 MG tablet Take 15 mg by mouth daily. with food  . Multiple Vitamin (MULTIVITAMIN) tablet Take 1 tablet by mouth daily.  . verapamil (CALAN-SR) 240 MG CR tablet TAKE 1 TABLET (240 MG TOTAL) BY MOUTH DAILY.   No facility-administered encounter medications on file as of 10/13/2016.    No Known Allergies Patient Active Problem List   Diagnosis Date Noted  . Kidney stones 10/13/2016  . Restless leg syndrome 09/04/2015  . Hyperlipidemia 12/01/2011  . SVT (supraventricular tachycardia) ([place]) 12/01/2011  . Smoking 12/01/2011  . Obesity 12/01/2011  . Hypertension 12/01/2011   Social History  Social History  . Marital status: Married    Spouse name: N/A  . Number of children: N/A  . Years of education: N/A   Occupational History  . Not on file.   Social History Main Topics  . Smoking status: Former Smoker    Packs/day: 0.50    Years: 25.00    Types: Cigarettes    Quit date: 07/18/2014  . Smokeless tobacco: Never Used  . Alcohol use 1.2 oz/week    2 Cans of beer per week  . Drug use:  Yes    Types: Morphine  . Sexual activity: Not on file   Other Topics Concern  . Not on file   Social History Narrative  . No narrative on file    Jose Hayes family history includes Hypertension in his father and mother.      Objective:    Vitals:   10/13/16 1412  BP: 130/78  Pulse: 80    Physical Exam  well-developed white male in no acute distress, accompanied by his wife. Blood pressure 130/70 pulse 80, height 5 foot 8, weight 254, BMI of 38.6. HEENT; nontraumatic normocephalic EOMI PERRLA sclera anicteric, Cardiovascular; regular rate and rhythm with S1-S2 no murmur or gallop, Pulmonary ;-he does have some scattered rhonchi bilaterally, Abdomen ;obese soft small umbilical hernia, is no definite palpable mass or hepatosplenomegaly is tender in the epigastrium without guarding, Rectal ;exam not done, Extremities; no clubbing cyanosis or edema skin warm and dry, Neuropsych ;mood and affect appropriate       Assessment & Hayes:   #56 56 year old white male with 4-6 week history of generalized abdominal fullness, bloated sensation and upper abdominal pain. Pain has been more bothersome at night. Patient also has a vague sensation of solid food dysphagia particularly with the first few bites of food with a sense of lodging in the lower chest. Noncontrasted CT scan done 09/07/2016 through [facility] as outlined above concerning for gastrohepatic, retrocrural and retroperitoneal lymphadenopathy concerning for underlying malignancy/metastatic disease or lymphoma. With weight loss and solid food dysphagia will rule out distal esophageal or gastric malignancy. #2 history of tubular adenomatous colon polyps-up-to-date with colonoscopy, due for follow-up July 2020 #3 history of ureteral lithiasis #4  2 mm exophytic lesion left kidney complicated cyst versus neoplasm #5 obesity #6 history of SVT #7 hypertension #8 status post cholecystectomy  Hayes; we'll check CBC with  differential,CMET, lipase, sedimentation rate, CEA Have scheduled for EGD with Jose Hayes next week with possible dilation. Procedure discussed in detail with patient including risks and benefits and he is agreeable to proceed.Marland Kitchen He will need further imaging, but we will wait for results of EGD prior to deciding on contrasted CT study of the chest abdomen and pelvis versus PET scan.  Nollan Muldrow S Jeffren Dombek PA-C 10/13/2016   Cc: Venia Carbon, MD

## 2016-10-13 NOTE — Progress Notes (Signed)
Cardiology Office Note  Date:  10/14/2016   ID:  Jose Hayes, DOB [birth year], MRN [id number]  PCP:  Venia Carbon, MD   Chief Complaint  Patient presents with  . other    12 month follow up. Meds reviewed by the pt. verbally. "doing well."     HPI:  Jose Hayes is a very pleasant 56 year-old gentleman with  obesity,  smoking history who smokes one half pack per day [facility] 11/05/2011 with SVT. Rhythm broke to normal sinus rhythm in the emergency room. Adenosine may have worked He presents for routine followup of his arrhythmia  No significant arrhythmia, he has stopped his verapamil He was afraid this was causing some GI disorder Significant GI discomfort, gas, fullness for numerous weeks Has been seen by GI, tried various therapies with no significant improvement Scheduled to have EGD in the near future Secondary to significant GI issues, weight is down 20 pounds, eating less Blood pressure appears to be reasonably well controlled without his verapamil  Continues to work in Engineer, technical sales at [employer]. No regular exercise program,    daughter is a Engineer, manufacturing, 56 years old.  Denies having significant snoring or apnea.  Hobbies include making guitars  EKG on today's visit shows normal sinus rhythm no significant ST or T-wave changes   other past medical history  Echocardiogram was essentially normal with normal ejection fraction, normal biventricular systolic pressures.  Total cholesterol 160. Tolerating Lipitor 20 mg daily Reports having blood work done through his doctor at work   PMH:   has a past medical history of Allergy; GERD (gastroesophageal reflux disease); Hyperglycemia; Hyperlipidemia; Hypertension; SVT (supraventricular tachycardia) ([place]); and Tobacco abuse.  PSH:    Past Surgical History:  Procedure Laterality Date  . LAPAROSCOPIC CHOLECYSTECTOMY  1991  . SHOULDER SURGERY Right 1985, 1991, 2000   left x3    Current Outpatient Prescriptions   Medication Sig Dispense Refill  . aspirin 81 MG tablet Take 81 mg by mouth daily.    Marland Kitchen atorvastatin (LIPITOR) 20 MG tablet TAKE 1 TABLET (20 MG TOTAL) BY MOUTH DAILY. 90 tablet 1  . dicyclomine (BENTYL) 20 MG tablet Take 20 mg by mouth every 6 (six) hours.    . meloxicam (MOBIC) 15 MG tablet Take 15 mg by mouth daily. with food  3  . Multiple Vitamin (MULTIVITAMIN) tablet Take 1 tablet by mouth daily.    . verapamil (CALAN-SR) 240 MG CR tablet TAKE 1 TABLET (240 MG TOTAL) BY MOUTH DAILY. (Patient not taking: Reported on 10/14/2016) 90 tablet 0   No current facility-administered medications for this visit.      Allergies:   Patient has no known allergies.   Social History:  The patient  reports that he has quit smoking. His smoking use included Cigarettes. He has a 12.50 pack-year smoking history. He has never used smokeless tobacco. He reports that he drinks about 1.2 oz of alcohol per week . He reports that he uses drugs, including Morphine.   Family History:   family history includes Hypertension in his father and mother.    Review of Systems: Review of Systems  Constitutional: Negative.   Respiratory: Negative.   Cardiovascular: Negative.   Gastrointestinal: Negative.   Musculoskeletal: Negative.   Neurological: Negative.   Psychiatric/Behavioral: Negative.   All other systems reviewed and are negative.    PHYSICAL EXAM: VS:  BP 132/86 (BP Location: Left Arm, Patient Position: Sitting, Cuff Size: Normal)   Pulse 71   Ht 5'  8" (1.727 m)   Wt 251 lb 8 oz (114.1 kg)   BMI 38.24 kg/m  , BMI Body mass index is 38.24 kg/m. GEN: Well nourished, well developed, in no acute distress  HEENT: normal  Neck: no JVD, carotid bruits, or masses Cardiac: RRR; no murmurs, rubs, or gallops,no edema  Respiratory:  clear to auscultation bilaterally, normal work of breathing GI: soft, nontender, nondistended, + BS MS: no deformity or atrophy  Skin: warm and dry, no rash Neuro:  Strength  and sensation are intact Psych: euthymic mood, full affect    Recent Labs: 10/13/2016: ALT 22; BUN 16; Creatinine, Ser 0.96; Hemoglobin 13.3; Platelets 292.0; Potassium 4.3; Sodium 139    Lipid Panel Lab Results  Component Value Date   CHOL 191 11/06/2011   HDL 41 11/06/2011   LDLCALC 129 (H) 11/06/2011   TRIG 107 11/06/2011      Wt Readings from Last 3 Encounters:  10/14/16 251 lb 8 oz (114.1 kg)  10/13/16 254 lb (115.2 kg)  09/04/15 265 lb 6.4 oz (120.4 kg)       ASSESSMENT AND PLAN:  SVT (supraventricular tachycardia) ([facility]) Denies having any significant arrhythmia Stopped his verapamil out of concern of GI issues We have discussed various treatment options with him We have provided a prescription for verapamil 40 mg pills to take as needed for breakthrough arrhythmia  Mixed hyperlipidemia No recent lipid panel available Previously relatively well-controlled, likely with improvement after recent weight loss  Essential hypertension Off his verapamil, blood pressure holding Improved likely from weight loss  Smoking Still smoking one half pack per day Recommended cessation  GI disorder Discomfort, bloating, gas, fullness Long discussion with him, etiology unclear Tried laxatives with no significant improvement Previously tried PPI Scheduled for EGD   Total encounter time more than 25 minutes  Greater than 50% was spent in counseling and coordination of care with the patient   Disposition:   F/U  6 months  No orders of the defined types were placed in this encounter.    Signed, Esmond Plants, M.D., Ph.D. 10/14/2016  [place], [place]

## 2016-10-13 NOTE — Patient Instructions (Addendum)
If you are age 56 or older, your body mass index should be between 23-30. Your Body mass index is 38.62 kg/m. If this is out of the aforementioned range listed, please consider follow up with your Primary Care Provider.  If you are age 21 or younger, your body mass index should be between 19-25. Your Body mass index is 38.62 kg/m. If this is out of the aformentioned range listed, please consider follow up with your Primary Care Provider.   Your physician has requested that you go to the basement for lab work before leaving today.  You have been scheduled for an endoscopy. Please follow written instructions given to you at your visit today. If you use inhalers (even only as needed), please bring them with you on the day of your procedure. Your physician has requested that you go to www.startemmi.com and enter the access code given to you at your visit today. This web site gives a general overview about your procedure. However, you should still follow specific instructions given to you by our office regarding your preparation for the procedure.  Thank you.

## 2016-10-13 NOTE — Progress Notes (Signed)
Reviewed and agree with management plan.  Dhyana Bastone T. Amita Atayde, MD FACG 

## 2016-10-14 ENCOUNTER — Encounter: Payer: Self-pay | Admitting: Cardiovascular Disease

## 2016-10-14 ENCOUNTER — Ambulatory Visit (INDEPENDENT_AMBULATORY_CARE_PROVIDER_SITE_OTHER): Payer: BLUE CROSS/BLUE SHIELD | Admitting: Cardiovascular Disease

## 2016-10-14 VITALS — BP 132/86 | HR 71 | Ht 68.0 in | Wt 251.5 lb

## 2016-10-14 DIAGNOSIS — E782 Mixed hyperlipidemia: Secondary | ICD-10-CM

## 2016-10-14 DIAGNOSIS — I471 Supraventricular tachycardia: Secondary | ICD-10-CM

## 2016-10-14 DIAGNOSIS — I10 Essential (primary) hypertension: Secondary | ICD-10-CM

## 2016-10-14 DIAGNOSIS — F17200 Nicotine dependence, unspecified, uncomplicated: Secondary | ICD-10-CM

## 2016-10-14 LAB — CEA: CEA: 1.3 ng/mL

## 2016-10-14 MED ORDER — VERAPAMIL HCL 40 MG PO TABS: 40.0000 mg | ORAL_TABLET | Freq: Three times a day (TID) | ORAL | 3 refills | Status: DC | PRN

## 2016-10-14 NOTE — Patient Instructions (Signed)
Medication Instructions:   No medication changes made  Take short acting verapamil 40 mg as needed for tachycardia  Labwork:  No new labs needed  Testing/Procedures:  No further testing at this time  Call if you would like a CT coronary calcium score   I recommend watching educational videos on topics of interest to you at:       www.goemmi.com  Enter code: [email or web address]    Follow-Up: It was a pleasure seeing you in the office today. Please call us if you have new issues that need to be addressed before your next appt.  [phone]  Your physician wants you to follow-up in:12 months.  You will receive a reminder letter in the mail two months in advance. If you don't receive a letter, please call our office to schedule the follow-up appointment.  If you need a refill on your cardiac medications before your next appointment, please call your pharmacy.

## 2016-10-15 ENCOUNTER — Telehealth: Payer: Self-pay | Admitting: Physician Assistant

## 2016-10-15 ENCOUNTER — Encounter: Payer: Self-pay | Admitting: Physician Assistant

## 2016-10-15 NOTE — Telephone Encounter (Signed)
Patient given the abnormal results of his lab. He understands that I am not able to advise of interpret these results. He thanks me for the information. He agrees to wait for the provider's review and recommendations.

## 2016-10-17 ENCOUNTER — Encounter: Payer: Self-pay | Admitting: Gastroenterology

## 2016-10-21 ENCOUNTER — Encounter: Payer: Self-pay | Admitting: Gastroenterology

## 2016-10-21 ENCOUNTER — Ambulatory Visit (AMBULATORY_SURGERY_CENTER): Payer: BLUE CROSS/BLUE SHIELD | Admitting: Gastroenterology

## 2016-10-21 ENCOUNTER — Telehealth: Payer: Self-pay

## 2016-10-21 VITALS — BP 132/83 | HR 74 | Temp 96.4°F | Resp 18 | Ht 68.0 in | Wt 254.0 lb

## 2016-10-21 DIAGNOSIS — R634 Abnormal weight loss: Secondary | ICD-10-CM

## 2016-10-21 DIAGNOSIS — D378 Neoplasm of uncertain behavior of other specified digestive organs: Secondary | ICD-10-CM

## 2016-10-21 DIAGNOSIS — C159 Malignant neoplasm of esophagus, unspecified: Secondary | ICD-10-CM

## 2016-10-21 DIAGNOSIS — R1013 Epigastric pain: Secondary | ICD-10-CM | POA: Diagnosis not present

## 2016-10-21 DIAGNOSIS — K2289 Other specified disease of esophagus: Secondary | ICD-10-CM

## 2016-10-21 DIAGNOSIS — R935 Abnormal findings on diagnostic imaging of other abdominal regions, including retroperitoneum: Secondary | ICD-10-CM | POA: Diagnosis not present

## 2016-10-21 DIAGNOSIS — C155 Malignant neoplasm of lower third of esophagus: Secondary | ICD-10-CM

## 2016-10-21 DIAGNOSIS — C160 Malignant neoplasm of cardia: Secondary | ICD-10-CM

## 2016-10-21 DIAGNOSIS — R1310 Dysphagia, unspecified: Secondary | ICD-10-CM | POA: Diagnosis present

## 2016-10-21 DIAGNOSIS — K228 Other specified diseases of esophagus: Secondary | ICD-10-CM

## 2016-10-21 HISTORY — DX: Malignant neoplasm of esophagus, unspecified: C15.9

## 2016-10-21 MED ORDER — TRAMADOL HCL 50 MG PO TABS: 50.0000 mg | ORAL_TABLET | Freq: Three times a day (TID) | ORAL | 1 refills | Status: DC | PRN

## 2016-10-21 MED ORDER — SODIUM CHLORIDE 0.9 % IV SOLN: 500.0000 mL | INTRAVENOUS | Status: DC

## 2016-10-21 NOTE — Progress Notes (Signed)
Report to PACU, RN, vss, BBS= Clear.  

## 2016-10-21 NOTE — Telephone Encounter (Signed)
oncology referral

## 2016-10-21 NOTE — Telephone Encounter (Signed)
-----   Message from Ladene Artist, MD sent at 10/21/2016  8:10 AM EDT ----- Chest, abdomen, pelvic CT with IV contrast See EGD report from today

## 2016-10-21 NOTE — Op Note (Signed)
[place] Patient Name: Jose Hayes Procedure Date: 10/21/2016 7:34 AM MRN: [id number] Endoscopist: Ladene Artist , MD Age: 56 Referring MD:  Date of Birth: 1960-09-03 Gender: Male Account #: [id number] Procedure:                Upper GI endoscopy Indications:              Epigastric abdominal pain, Dysphagia, Abnormal CT                            of the GI tract, Weight loss Medicines:                Monitored Anesthesia Care Procedure:                Pre-Anesthesia Assessment:                           - Prior to the procedure, a History and Physical                            was performed, and patient medications and                            allergies were reviewed. The patient's tolerance of                            previous anesthesia was also reviewed. The risks                            and benefits of the procedure and the sedation                            options and risks were discussed with the patient.                            All questions were answered, and informed consent                            was obtained. Prior Anticoagulants: The patient has                            taken no previous anticoagulant or antiplatelet                            agents. ASA Grade Assessment: II - A patient with                            mild systemic disease. After reviewing the risks                            and benefits, the patient was deemed in                            satisfactory condition to undergo the procedure.  After obtaining informed consent, the endoscope was                            passed under direct vision. Throughout the                            procedure, the patient's blood pressure, pulse, and                            oxygen saturations were monitored continuously. The                            Endoscope was introduced through the mouth, and                            advanced to the second  part of duodenum. The upper                            GI endoscopy was accomplished without difficulty.                            The patient tolerated the procedure well. Scope In: Scope Out: Findings:                 A fungating mass with no bleeding and no stigmata                            of recent bleeding was found in the distal                            esophagus, 38 cm from the incisors. The mass was                            partially obstructing and partially circumferential                            (involving one-half of the lumen circumference).                            Biopsies were taken with a cold forceps for                            histology.                           The exam of the esophagus was otherwise normal.                           A fungating, partially circumferential (involving                            one-half of the lumen circumference) mass with no  bleeding and no stigmata of recent bleeding was                            found in the cardia and in the gastric fundus.                            Biopsies were taken with a cold forceps for                            histology.                           The exam of the stomach was otherwise normal.                           The duodenal bulb and second portion of the                            duodenum were normal. Estimated blood loss was                            minimal. Complications:            No immediate complications. Estimated Blood Loss:     Estimated blood loss was minimal. Impression:               - Partially obstructing, malignant esophageal tumor                            was found in the distal esophagus. Biopsied.                           - Malignant gastric tumor in the cardia and in the                            gastric fundus. Biopsied.                           - Normal duodenal bulb and second portion of the                             duodenum. Recommendation:           - Patient has a contact number available for                            emergencies. The signs and symptoms of potential                            delayed complications were discussed with the                            patient. Return to normal activities tomorrow.                            Written discharge instructions were provided  to the                            patient.                           - Soft diet indefinitely.                           - Continue present medications.                           - Await pathology results.                           - Perform a CT scan (computed tomography) of chest,                            abdomen and pelvis with contrast at the next                            available appointment. Ladene Artist, MD 10/21/2016 7:55:55 AM This report has been signed electronically.

## 2016-10-21 NOTE — Telephone Encounter (Signed)
Did you want oncology referral as well for him?

## 2016-10-21 NOTE — Patient Instructions (Addendum)
YOU HAD AN ENDOSCOPIC PROCEDURE TODAY AT [facility]:   Refer to the procedure report that was given to you for any specific questions about what was found during the examination.  If the procedure report does not answer your questions, please call your gastroenterologist to clarify.  If you requested that your care partner not be given the details of your procedure findings, then the procedure report has been included in a sealed envelope for you to review at your convenience later.  YOU SHOULD EXPECT: Some feelings of bloating in the abdomen. Passage of more gas than usual.  Walking can help get rid of the air that was put into your GI tract during the procedure and reduce the bloating. If you had a lower endoscopy (such as a colonoscopy or flexible sigmoidoscopy) you may notice spotting of blood in your stool or on the toilet paper. If you underwent a bowel prep for your procedure, you may not have a normal bowel movement for a few days.  Please Note:  You might notice some irritation and congestion in your nose or some drainage.  This is from the oxygen used during your procedure.  There is no need for concern and it should clear up in a day or so.  SYMPTOMS TO REPORT IMMEDIATELY:   Following upper endoscopy (EGD)  Vomiting of blood or coffee ground material  New chest pain or pain under the shoulder blades  Painful or persistently difficult swallowing  New shortness of breath  Fever of 100F or higher  Black, tarry-looking stools  For urgent or emergent issues, a gastroenterologist can be reached at any hour by calling [phone].   DIET:  Soft diet indefinitely. Please see handout given to you by your recovery nurse. Drink plenty of fluids but you should avoid alcoholic beverages for 24 hours.  MEDICATIONS:  Continue present medications. Tramadol 50 mg by mouth 3 times daily (every 8 hours) as needed for pain, 60 tablets with 1 refill.  Perform CT scan (computed  tomography) of chest, abdomen, and pelvis with contrast at the next available appointment.  Dr. Lynne Leader nurse will call you with your CT scan appointment time.   ACTIVITY:  You should plan to take it easy for the rest of today and you should NOT DRIVE or use heavy machinery until tomorrow (because of the sedation medicines used during the test).    FOLLOW UP: Our staff will call the number listed on your records the next business day following your procedure to check on you and address any questions or concerns that you may have regarding the information given to you following your procedure. If we do not reach you, we will leave a message.  However, if you are feeling well and you are not experiencing any problems, there is no need to return our call.  We will assume that you have returned to your regular daily activities without incident.  If any biopsies were taken you will be contacted by phone or by letter within the next 1-3 weeks.  Please call us at [phone] if you have not heard about the biopsies in 3 weeks.   Thank you for allowing [place] to provide for your healthcare needs today.   SIGNATURES/CONFIDENTIALITY: You and/or your care partner have signed paperwork which will be entered into your electronic medical record.  These signatures attest to the fact that that the information above on your After Visit Summary has been reviewed and is understood.  Full  responsibility of the confidentiality of this discharge information lies with you and/or your care-partner.

## 2016-10-21 NOTE — Progress Notes (Signed)
Called to room to assist during endoscopic procedure.  Patient ID and intended procedure confirmed with present staff. Received instructions for my participation in the procedure from the performing physician.  

## 2016-10-21 NOTE — Telephone Encounter (Signed)
Patient notified of CT scan for 10/25/16 to arrive at 8:45.  Patient is notified not to drink oral contrast and that he will drink contrast there at CT.  He verbalized understanding.

## 2016-10-22 ENCOUNTER — Telehealth: Payer: Self-pay | Admitting: *Deleted

## 2016-10-22 ENCOUNTER — Telehealth: Payer: Self-pay

## 2016-10-22 MED ORDER — TRAMADOL HCL 50 MG PO TABS: 50.0000 mg | ORAL_TABLET | Freq: Three times a day (TID) | ORAL | 1 refills | Status: DC | PRN

## 2016-10-22 NOTE — Addendum Note (Signed)
Addended by: Marlon Pel on: 10/22/2016 02:49 PM   Modules accepted: Orders

## 2016-10-22 NOTE — Telephone Encounter (Signed)
  Follow up Call-  Call back number 10/21/2016  Post procedure Call Back phone  # [phone] cell  Permission to leave phone message Yes  Some recent data might be hidden     Patient questions:  Do you have a fever, pain , or abdominal swelling? Yes.   Pain Score  4 *stated this is the same pain as he was having before this procedure.  Have you tolerated food without any problems? Yes.    Have you been able to return to your normal activities? Yes.    Do you have any questions about your discharge instructions: Diet   No. Medications  No. Follow up visit  No.  Do you have questions or concerns about your Care? No.  Actions: * If pain score is 4 or above: No action needed, pain <4.see above; pt stated this is same pain as before procedure.

## 2016-10-22 NOTE — Telephone Encounter (Signed)
Patient notified that will need referral to Oncology.  He would like to discuss with his wife where he would like to go.  He lives in [place] and is considering [place], [facility], or [place].  He will discuss with his wife and they will call me back.

## 2016-10-22 NOTE — Telephone Encounter (Signed)
Message left

## 2016-10-22 NOTE — Telephone Encounter (Signed)
Tramadol was ordered yesterday at procedure visit.  I faxed a copy of rx to his pharmacy.  He has decided he would like referral to go to [facility].  He is aware that they will contact him directly.

## 2016-10-23 ENCOUNTER — Encounter: Payer: Self-pay | Admitting: Oncology

## 2016-10-23 ENCOUNTER — Telehealth: Payer: Self-pay | Admitting: Oncology

## 2016-10-23 NOTE — Telephone Encounter (Signed)
Appt has been scheduled for the pt to see Dr. Benay Spice on 6/20 at 230pm. Pt aware to arrive 30 minutes early. Demographics verified. Letter mailed.

## 2016-10-24 ENCOUNTER — Other Ambulatory Visit: Payer: Self-pay | Admitting: Cardiovascular Disease

## 2016-10-25 ENCOUNTER — Ambulatory Visit (INDEPENDENT_AMBULATORY_CARE_PROVIDER_SITE_OTHER)
Admission: RE | Admit: 2016-10-25 | Discharge: 2016-10-25 | Disposition: A | Discharge status: Home / Self Care | Payer: BLUE CROSS/BLUE SHIELD | Source: Ambulatory Visit | Attending: Gastroenterology | Admitting: Gastroenterology

## 2016-10-25 DIAGNOSIS — R634 Abnormal weight loss: Secondary | ICD-10-CM | POA: Diagnosis not present

## 2016-10-25 DIAGNOSIS — K228 Other specified diseases of esophagus: Secondary | ICD-10-CM

## 2016-10-25 DIAGNOSIS — K2289 Other specified disease of esophagus: Secondary | ICD-10-CM

## 2016-10-25 MED ORDER — IOPAMIDOL (ISOVUE-300) INJECTION 61%
100.0000 mL | Freq: Once | INTRAVENOUS | Status: AC | PRN
  Administered 2016-10-25: 100 mL via INTRAVENOUS

## 2016-10-25 NOTE — Telephone Encounter (Signed)
Patient has been scheduled for appt with Dr. Benay Spice for 10/27/16

## 2016-10-26 ENCOUNTER — Encounter: Payer: Self-pay | Admitting: Radiation Oncology

## 2016-10-27 ENCOUNTER — Telehealth: Payer: Self-pay | Admitting: Oncology

## 2016-10-27 ENCOUNTER — Ambulatory Visit (HOSPITAL_BASED_OUTPATIENT_CLINIC_OR_DEPARTMENT_OTHER): Payer: BLUE CROSS/BLUE SHIELD | Admitting: Oncology

## 2016-10-27 VITALS — BP 160/84 | HR 89 | Temp 99.3°F | Resp 19 | Ht 68.0 in | Wt 246.7 lb

## 2016-10-27 DIAGNOSIS — G893 Neoplasm related pain (acute) (chronic): Secondary | ICD-10-CM | POA: Diagnosis not present

## 2016-10-27 DIAGNOSIS — N289 Disorder of kidney and ureter, unspecified: Secondary | ICD-10-CM | POA: Diagnosis not present

## 2016-10-27 DIAGNOSIS — C160 Malignant neoplasm of cardia: Secondary | ICD-10-CM

## 2016-10-27 DIAGNOSIS — C778 Secondary and unspecified malignant neoplasm of lymph nodes of multiple regions: Secondary | ICD-10-CM | POA: Diagnosis not present

## 2016-10-27 DIAGNOSIS — I10 Essential (primary) hypertension: Secondary | ICD-10-CM

## 2016-10-27 DIAGNOSIS — R509 Fever, unspecified: Secondary | ICD-10-CM

## 2016-10-27 DIAGNOSIS — Z720 Tobacco use: Secondary | ICD-10-CM

## 2016-10-27 DIAGNOSIS — R634 Abnormal weight loss: Secondary | ICD-10-CM

## 2016-10-27 DIAGNOSIS — Z807 Family history of other malignant neoplasms of lymphoid, hematopoietic and related tissues: Secondary | ICD-10-CM

## 2016-10-27 DIAGNOSIS — Z800 Family history of malignant neoplasm of digestive organs: Secondary | ICD-10-CM

## 2016-10-27 NOTE — Telephone Encounter (Signed)
Scheduled appt per 6/20 los - Gave patient AVS and calender per LOS.  

## 2016-10-27 NOTE — Progress Notes (Signed)
Jose Hayes Consult   Referring MD: Natalio Salois 56 y.o.  10-28-1960    Reason for Referral: Gastroesophageal carcinoma   HPI: [name] reports pain in the upper abdomen and flank areas for the past few months. He was referred to urology and had a noncontrast CT 09/07/2016. This revealed a 4 mm nonobstructing stone in the left kidney, an exophytic lesion in the lower pole of the left kidney, and gastrohepatic, retrocrural, retroperitoneal lymphadenopathy.  He reports passing several kidney stones, but the upper abdominal pain persist. He has intermittent episodes of solid dysphagia, feeling as though food is lodging in his lower chest.  He was referred to Dr. Fuller Plan and was taken to an upper endoscopy procedure 10/21/2016. A fungating mass was noted at the distal esophagus with tumor extending to the GE junction and gastric cardia/fundus. A biopsy confirmed poorly differentiated carcinoma. Immunohistochemical stains are consistent with adenocarcinoma.  He underwent staging CTs of the chest, abdomen, and pelvis on 10/25/2016. There is mediastinal adenopathy. Probable cyst in liver segment 2. Stable 27 m cyst at the lower pole of the left kidney. New 1.7 x 2 cm indeterminate lesion abutting the medial upper pole of the right kidney. A large ulcerated soft tissue mass was noted to rise from the GE junction. Retroperitoneal and upper abdominal metastatic lymphadenopathy with a borderline enlarged nodes in the gastrohepatic ligament. The lymph nodes have increased in size compared to the CT from 09/07/2016.  He is scheduled for a second opinion appointment at [facility] on 11/11/2016. He is scheduled to see Dr. Lisbeth Renshaw 11/02/2016.  Past Medical History:  Diagnosis Date  . Allergy   . GERD (gastroesophageal reflux disease)   . Hyperglycemia   . Hyperlipidemia   . Hypertension   . SVT (supraventricular tachycardia) ([place])    2013  . Tobacco abuse       Past Surgical History:  Procedure Laterality Date  . COLONOSCOPY    . LAPAROSCOPIC CHOLECYSTECTOMY  1991  . SHOULDER SURGERY Right 1985, 1991, 2000   left x3  . [place] teeth extraction    . UPPER GASTROINTESTINAL ENDOSCOPY      Medications: Reviewed  Allergies: No Known Allergies  Family history: His father died of renal cell carcinoma at age 31. A sister died of Hodgkin's lymphoma. He had 5 siblings. He has 3 children.  Social History:   He lives in [place]. He works in [employer], academic. He smokes cigarettes. He does not use [place]. No transfusion history. No risk factor for HIV or hepatitis.  ROS:   Positives include: Low-grade fever in the late afternoon and evening, chills at night, 35 pound weight loss, solid dysphagia-intermittent, upper abdominal and left back pain  A complete ROS was otherwise negative.  Physical Exam:  Blood pressure (!) 160/84, pulse 89, temperature 99.3 F (37.4 C), temperature source Oral, resp. rate 19, height '5\' 8"'  (1.727 m), weight 246 lb 11.2 oz (111.9 kg), SpO2 98 %.  HEENT: Oropharynx without visible mass, neck without mass Lungs: Clear bilaterally Cardiac: Regular rate and rhythm Abdomen: No hepatosplenomegaly, nontender, no mass GU: Testes without mass  Vascular: No leg edema Lymph nodes: No cervical, supraclavicular, axillary, or inguinal nodes Neurologic: Alert and oriented, the exam appears intact in the upper and lower extremities Skin: No rash, multiple lipomas Musculoskeletal: No spine tenderness   LAB:  CBC  Lab Results  Component Value Date   WBC 8.3 10/13/2016   HGB 13.3  10/13/2016   HCT 40.2 10/13/2016   MCV 83.8 10/13/2016   PLT 292.0 10/13/2016   NEUTROABS 5.4 10/13/2016        CMP     Component Value Date/Time   NA 139 10/13/2016 1523   NA 142 11/06/2011 0427   K 4.3 10/13/2016 1523   K 4.1 11/06/2011 0427   CL 103 10/13/2016 1523   CL 107 11/06/2011 0427   CO2 31 10/13/2016 1523    CO2 29 11/06/2011 0427   GLUCOSE 131 (H) 10/13/2016 1523   GLUCOSE 93 11/06/2011 0427   BUN 16 10/13/2016 1523   BUN 17 11/06/2011 0427   CREATININE 0.96 10/13/2016 1523   CREATININE 1.03 11/06/2011 0427   CALCIUM 9.4 10/13/2016 1523   CALCIUM 8.5 11/06/2011 0427   PROT 7.3 10/13/2016 1523   PROT 7.7 11/05/2011 1051   ALBUMIN 4.4 10/13/2016 1523   ALBUMIN 4.2 11/05/2011 1051   AST 15 10/13/2016 1523   AST 31 11/06/2011 0427   ALT 22 10/13/2016 1523   ALT 74 11/05/2011 1051   ALKPHOS 69 10/13/2016 1523   ALKPHOS 84 11/05/2011 1051   BILITOT 0.4 10/13/2016 1523   BILITOT 0.4 11/05/2011 1051   GFRNONAA >60 11/06/2011 0427   GFRAA >60 11/06/2011 0427    Imaging: CTs chest, abdomen, and pelvis on 10/25/2016-images were reviewed with Mr. Carmen and his wife   Assessment/Plan:   1. Gastroesophageal carcinoma-adenocarcinoma  Upper endoscopy 10/21/2016 with tumor noted beginning at the distal esophagus, extending into the gastric cardia/fundus  Staging CT scans 10/25/2016-large mass at the GE junction, metastatic adenopathy in the mediastinum, retrocrural, gastric hepatic ligament, celiac, and retroperitoneal stations. New enhancing soft tissue density adjacent to the upper pole of right kidney suspicious for a metastasis  2. Abdominal/back pain secondary to #1  3.   Weight loss  4.    Report of low-grade fever-likely secondary to #1  5.    Tobacco use  6.    History of SVT  7.   History of gastroesophageal reflux  8.   Hypertension   Disposition:   Mr. Burget has been diagnosed with adenocarcinoma of the gastroesophageal junction. The staging CTs are consistent with metastatic disease involving chest, abdominal, and retroperitoneal lymph nodes. There may be a metastatic deposit adjacent to the right kidney.  I discussed the diagnosis and treatment options with Mr. Mirsky and his wife. He understands there is no role for surgery if he has metastatic disease. He  will likely not be a candidate for radiation if additional staging confirms metastatic disease. I explained no therapy will be curative if he has metastatic disease. I will recommend systemic therapy.  We will request HER-2/neu testing on the endoscopic biopsy sample. He will be referred for a staging PET scan. Mr. Burch will return for an office visit and further discussion on 11/04/2016.  He is scheduled for a consultation at [facility] on 11/11/2016. He may qualify for a first line clinical trial there.  50 minutes were spent with the Hayes today. The majority of the time was used for counseling and coordination of care.  Donneta Romberg, MD  10/27/2016, 2:58 PM

## 2016-10-29 NOTE — Progress Notes (Signed)
10/28/16: Request put in to run HER-2 and Foundation one on pathology report from 10/25/16 with [facility] from Pathology.

## 2016-11-01 ENCOUNTER — Ambulatory Visit (HOSPITAL_COMMUNITY)
Admission: RE | Admit: 2016-11-01 | Discharge: 2016-11-01 | Disposition: A | Discharge status: Home / Self Care | Payer: BLUE CROSS/BLUE SHIELD | Source: Ambulatory Visit | Attending: Oncology | Admitting: Oncology

## 2016-11-01 ENCOUNTER — Encounter: Payer: Self-pay | Admitting: Radiation Oncology

## 2016-11-01 DIAGNOSIS — C160 Malignant neoplasm of cardia: Secondary | ICD-10-CM | POA: Insufficient documentation

## 2016-11-01 DIAGNOSIS — R1906 Epigastric swelling, mass or lump: Secondary | ICD-10-CM | POA: Diagnosis not present

## 2016-11-01 DIAGNOSIS — N200 Calculus of kidney: Secondary | ICD-10-CM | POA: Diagnosis not present

## 2016-11-01 DIAGNOSIS — K2210 Ulcer of esophagus without bleeding: Secondary | ICD-10-CM | POA: Diagnosis not present

## 2016-11-01 DIAGNOSIS — C787 Secondary malignant neoplasm of liver and intrahepatic bile duct: Secondary | ICD-10-CM | POA: Diagnosis not present

## 2016-11-01 LAB — GLUCOSE, CAPILLARY: Glucose-Capillary: 112 mg/dL — ABNORMAL HIGH (ref 65–99)

## 2016-11-01 MED ORDER — FLUDEOXYGLUCOSE F - 18 (FDG) INJECTION
12.2500 | Freq: Once | INTRAVENOUS | Status: AC | PRN
  Administered 2016-11-01: 12.25 via INTRAVENOUS

## 2016-11-01 NOTE — Progress Notes (Signed)
GI Location of Tumor / Histology: Esophageal mass at gastroesophageal junction,   Jose Hayes presented  months ago with symptoms of: pain in upper abdomen and flank areas past few months, episodes solid dysphagia, food lodging in lower chest  Biopsies of (if applicable) revealed: Diagnosis 10/21/2016: Esophagus, biopsy, distal and cardia - POORLY DIFFERENTIATED CARCINOMA.   Past/Anticipated interventions by surgeon, if any: Dr. Fuller Plan, MD EGD 10/21/16  Past/Anticipated interventions by medical oncology, if any:Dr. Benay Spice, 10/27/16 seen,, referred for Pet scan,,  patient to get 2nd opinion at [facility] 11/11/16  Weight changes, if any: 30 lb weight loss past 2 months  Bowel/Bladder complaints, if any: regular  Nausea / Vomiting, if any: No  Pain issues, if any: sternum when food gets stuck, right side lower abdomen , dull ache  Any blood per rectum:  NO  SAFETY ISSUES:  Prior radiation? NO  Pacemaker/ICD? nO  Is the patient on methotrexate?  no  Current Complaints/Details:  3 children,  Smokes cigarettes, DOWN TO 3-4 NOW,  No alcohol, no drug use, dips tobacco 1-2x week,;  hx kidney stones,SVT,2013;  Father died of renal cell carcinoma,age 97,sister died of hodgkin's lymphoma,  BP (!) 147/93   Pulse 86   Temp 99.3 F (37.4 C) (Oral)   Resp 18   Ht 5\' 8"  (1.727 m)   Wt 244 lb 3.2 oz (110.8 kg)   BMI 37.13 kg/m   Wt Readings from Last 3 Encounters:  11/02/16 244 lb 3.2 oz (110.8 kg)  10/27/16 246 lb 11.2 oz (111.9 kg)  10/21/16 254 lb (115.2 kg)

## 2016-11-02 ENCOUNTER — Ambulatory Visit
Admission: RE | Admit: 2016-11-02 | Discharge: 2016-11-02 | Disposition: A | Discharge status: Home / Self Care | Payer: BLUE CROSS/BLUE SHIELD | Source: Ambulatory Visit | Attending: Radiation Oncology | Admitting: Radiation Oncology

## 2016-11-02 ENCOUNTER — Encounter: Payer: Self-pay | Admitting: Radiation Oncology

## 2016-11-02 VITALS — BP 147/93 | HR 86 | Temp 99.3°F | Resp 18 | Ht 68.0 in | Wt 244.2 lb

## 2016-11-02 DIAGNOSIS — Z9049 Acquired absence of other specified parts of digestive tract: Secondary | ICD-10-CM | POA: Diagnosis not present

## 2016-11-02 DIAGNOSIS — C160 Malignant neoplasm of cardia: Secondary | ICD-10-CM | POA: Insufficient documentation

## 2016-11-02 DIAGNOSIS — Z7982 Long term (current) use of aspirin: Secondary | ICD-10-CM | POA: Insufficient documentation

## 2016-11-02 DIAGNOSIS — E785 Hyperlipidemia, unspecified: Secondary | ICD-10-CM | POA: Diagnosis not present

## 2016-11-02 DIAGNOSIS — C155 Malignant neoplasm of lower third of esophagus: Secondary | ICD-10-CM | POA: Diagnosis not present

## 2016-11-02 DIAGNOSIS — Z800 Family history of malignant neoplasm of digestive organs: Secondary | ICD-10-CM | POA: Diagnosis not present

## 2016-11-02 DIAGNOSIS — Z9889 Other specified postprocedural states: Secondary | ICD-10-CM | POA: Insufficient documentation

## 2016-11-02 DIAGNOSIS — Z8051 Family history of malignant neoplasm of kidney: Secondary | ICD-10-CM | POA: Insufficient documentation

## 2016-11-02 DIAGNOSIS — I10 Essential (primary) hypertension: Secondary | ICD-10-CM | POA: Insufficient documentation

## 2016-11-02 HISTORY — DX: Malignant neoplasm of esophagus, unspecified: C15.9

## 2016-11-02 NOTE — Progress Notes (Signed)
Please see the Nurse Progress Note in the MD Initial Consult Encounter for this patient. 

## 2016-11-02 NOTE — Progress Notes (Addendum)
Radiation Oncology         [phone]) [phone] ________________________________  Name: Jose Hayes MRN: [id number]  Date: 11/02/2016  DOB: [birth year]  SA:[id number], Pcp Not In  Ladell Pier, MD     REFERRING PHYSICIAN: Ladell Pier, MD   DIAGNOSIS: 56 year-old gentleman with Stage IV Gastroesophageal carcinoma-adenocarcinoma  The encounter diagnosis was Malignant neoplasm of lower third of esophagus ([place]).   HISTORY OF PRESENT ILLNESS: Jose Hayes is a 56 y.o. male seen at the request of Dr. Benay Spice. The patient presented to gastroenterology on 10/13/2016 complaining of upper abdominal pain, bloating, solid food dysphagia, and weight loss. He was referred to Dr. Fuller Plan for upper endoscopy procedure on 10/21/2016. A fungating mass was noted at the distal esophagus with tumor extending to the GE junction and gastric cardia/fundus. A biopsy confirmed poorly differentiated carcinoma. Immunohistochemical stains consistent with adenocarcinoma. HER-2 negative.  CT Chest, Abdomen, Pelvis on 10/25/2016 showed large, irregular ulcerated mass at the gastroesophageal junction consistent with the clinical history of esophageal cancer. There was evidence of a multifocal metastatic adenopathy involving the mediastinum, retrocrural, gastrohepatic ligament, celiac and retroperitoneal/para-aortic nodal stations. There has been a significant increase in size of the lymph nodes identified on the relatively recent prior scan from 09/07/2016 concerning for progression of disease. There was a new 2 cm enhancing soft tissue density abutting the medial upper pole of the right kidney. This appeared to be centered within the perinephric fat and abutting the kidney rather than emanating exophytically from the kidney and concerning for metastatic implant. There was a nonspecific region of geographic hypoattenuation in the medial aspect of hepatic segment 4A along the fissure for the falciform ligament. No other live  lesions identified at this time. Also seen was stable 4 mm likely benign granuloma in the right lower lobe which has been present back to February 2007 with no additional pulmonary nodules to suggest pulmonary metastatic disease.  PET scan on 11/01/2016 showed a large ulcerated mass of the gastroesophageal junction, with max SUV 70.3. A single metastatic lesion in the liver, at the right hepatic lobe max SUV 34.2. A small but hyperintense focus along the anterior margin of the right scapula near the base of the acromion, compatible with a metastatic deposit, max SUV 27.7, Also seen were various nodals chains in the supraclavicular regions, chest, and upper abdomen. There is also a metastatic focus to the right perirenal space measuring 1.3 by 1.9 cm and max SUV 51.7, compatible with metastatic deposit.   He is scheduled for follow up with Dr. Learta Codding this Thursday, 6/28. He is scheduled for a consultation/2nd opinion at [facility] on 11/11/2016.  The patient is here to discuss radiation treatment in the management of his disease.   PREVIOUS RADIATION THERAPY: No   PAST MEDICAL HISTORY:  Past Medical History:  Diagnosis Date  . Allergy   . Esophageal cancer ([place]) 10/21/2016   GE junction  . GERD (gastroesophageal reflux disease)   . Hyperglycemia   . Hyperlipidemia   . Hypertension   . SVT (supraventricular tachycardia) ([place])    2013  . Tobacco abuse        PAST SURGICAL HISTORY: Past Surgical History:  Procedure Laterality Date  . COLONOSCOPY    . LAPAROSCOPIC CHOLECYSTECTOMY  1991  . SHOULDER SURGERY Right 1985, 1991, 2000   left x3  . [place] teeth extraction    . UPPER GASTROINTESTINAL ENDOSCOPY       FAMILY HISTORY:  Family History  Problem Relation  Age of Onset  . Hypertension Mother   . Hypertension Father   . Pancreatic cancer Father   . Clotting disorder Father        renal cell ca  . Colon cancer Neg Hx   . Esophageal cancer Neg Hx   . Prostate cancer Neg Hx   .  Rectal cancer Neg Hx   . Stomach cancer Neg Hx      SOCIAL HISTORY:  reports that he has been smoking Cigarettes.  He has a 12.50 pack-year smoking history. He has never used smokeless tobacco. He reports that he drinks alcohol. He reports that he does not use drugs. He is an Office manager.   ALLERGIES: Patient has no known allergies.   MEDICATIONS:  Current Outpatient Prescriptions  Medication Sig Dispense Refill  . traMADol (ULTRAM) 50 MG tablet Take 1 tablet (50 mg total) by mouth 3 (three) times daily as needed. 60 tablet 1  . vitamin C (ASCORBIC ACID) 500 MG tablet Take 500 mg by mouth 2 (two) times daily.    Marland Kitchen aspirin EC 81 MG tablet Take 81 mg by mouth daily.    Marland Kitchen atorvastatin (LIPITOR) 20 MG tablet TAKE 1 TABLET (20 MG TOTAL) BY MOUTH DAILY. (Patient not taking: Reported on 11/02/2016) 90 tablet 3  . meloxicam (MOBIC) 15 MG tablet Take 15 mg by mouth daily. with food  3  . Multiple Vitamin (MULTIVITAMIN) tablet Take 1 tablet by mouth daily.    . verapamil (CALAN) 40 MG tablet Take 1 tablet (40 mg total) by mouth 3 (three) times daily as needed. (Patient not taking: Reported on 10/21/2016) 90 tablet 3   Current Facility-Administered Medications  Medication Dose Route Frequency Provider Last Rate Last Dose  . 0.9 %  sodium chloride infusion  500 mL Intravenous Continuous Ladene Artist, MD         REVIEW OF SYSTEMS: On review of systems, the patient reports that he is doing well overall. He reports 30 lb weight loss over the past 2 months. He reports good appetite and weight stabilization over the past two weeks. He reports pain at the sternum when foods get stuck. He also reports left sided lower abdominal pain, described as a dull ache. If he eats too much, the pain is worse, and it is also worse if he hasn't eaten in a while. He does state right sided abdominal pain is beginning to develop. He also states pain across his back at times. He does not believe this  pain is associated with kidney stones. He states the dysphagia is fairly manageable, though he does have issue with broccoli and large chunks of red meat like steak. The dysphagia is most prevalent when he first starts eating and decreases when he sits up straight while eating. He reports temperature of 100F at night for the last two weeks associated with chills. He denies daytime fevers or chills.  He denies any shortness of breath, cough, chills, night sweats. He denies any bladder disturbances, and denies nausea or vomiting. He denies blood in urine. He does report occasional constipation. A complete review of systems is obtained and is otherwise negative.     PHYSICAL EXAM:  Wt Readings from Last 3 Encounters:  11/02/16 244 lb 3.2 oz (110.8 kg)  10/27/16 246 lb 11.2 oz (111.9 kg)  10/21/16 254 lb (115.2 kg)   Temp Readings from Last 3 Encounters:  11/02/16 99.3 F (37.4 C) (Oral)  10/27/16 99.3 F (37.4 C) (Oral)  10/21/16 (!) 96.4 F (35.8 C)   BP Readings from Last 3 Encounters:  11/02/16 (!) 147/93  10/27/16 (!) 160/84  10/21/16 132/83   Pulse Readings from Last 3 Encounters:  11/02/16 86  10/27/16 89  10/21/16 74   Pain Assessment Pain Score: 0-No pain/10  In general this is a well appearing caucasian male in no acute distress. He is alert and oriented x4 and appropriate throughout the examination. HEENT reveals that the patient is normocephalic, atraumatic. EOMs are intact. PERRLA. Skin is intact without any evidence of gross lesions. Cardiovascular exam reveals a regular rate and rhythm, no clicks rubs or murmurs are auscultated. Chest is clear to auscultation bilaterally. Lymphatic assessment is performed and does not reveal any adenopathy in the cervical, supraclavicular, axillary, or inguinal chains. Abdomen has active bowel sounds in all quadrants and is intact. The abdomen is soft, non tender, non distended. Lower extremities are negative for pretibial pitting edema, deep  calf tenderness, cyanosis or clubbing.    ECOG = 1  0 - Asymptomatic (Fully active, able to carry on all predisease activities without restriction)  1 - Symptomatic but completely ambulatory (Restricted in physically strenuous activity but ambulatory and able to carry out work of a light or sedentary nature. For example, light housework, office work)  2 - Symptomatic, <50% in bed during the day (Ambulatory and capable of all self care but unable to carry out any work activities. Up and about more than 50% of waking hours)  3 - Symptomatic, >50% in bed, but not bedbound (Capable of only limited self-care, confined to bed or chair 50% or more of waking hours)  4 - Bedbound (Completely disabled. Cannot carry on any self-care. Totally confined to bed or chair)  5 - Death   Eustace Pen MM, Creech RH, Tormey DC, et al. [phone]). "Toxicity and response criteria of the [facility]". [place] Oncol. 5 (6): 649-55    LABORATORY DATA:  Lab Results  Component Value Date   WBC 8.3 10/13/2016   HGB 13.3 10/13/2016   HCT 40.2 10/13/2016   MCV 83.8 10/13/2016   PLT 292.0 10/13/2016   Lab Results  Component Value Date   NA 139 10/13/2016   K 4.3 10/13/2016   CL 103 10/13/2016   CO2 31 10/13/2016   Lab Results  Component Value Date   ALT 22 10/13/2016   AST 15 10/13/2016   ALKPHOS 69 10/13/2016   BILITOT 0.4 10/13/2016      RADIOGRAPHY: Ct Chest W Contrast  Result Date: 10/25/2016 CLINICAL DATA:  56 year old male with newly diagnosed esophageal mass by EGD. Clinical symptoms include weight loss, epigastric pain and difficulty swallowing for the past several months. EXAM: CT CHEST, ABDOMEN, AND PELVIS WITH CONTRAST TECHNIQUE: Multidetector CT imaging of the chest, abdomen and pelvis was performed following the standard protocol during bolus administration of intravenous contrast. CONTRAST:  112m ISOVUE-300 IOPAMIDOL (ISOVUE-300) INJECTION 61% COMPARISON:  Prior CT scan  of the abdomen and pelvis 09/07/2016; prior CT scan of the chest 07/06/2005 FINDINGS: CT CHEST FINDINGS Cardiovascular: Conventional 3 vessel arch anatomy. No evidence of aortic aneurysm or dissection. The heart is normal in size. No pericardial effusion. Unremarkable pulmonary artery is and venous structures. Mediastinum/Nodes: Positive for mediastinal adenopathy which was not present on the remote prior study from 2007. Index nodes include a right paratracheal lymph node measuring 1.3 cm in short axis (image 17 series 2) and a paraesophageal lymph node measuring 1.2 cm in short axis (  image 31 series 2) and additional para-aortic lymph node is present in the posterior mediastinum an is enlarged at 1.6 cm compared to 1.4 cm on the CT scan from 09/07/2016. Lungs/Pleura: 4 mm nodular opacity in the right lower lobe (image 40, series 3) was likely present on the prior study from 2007 although the region was limited by motion artifact on the prior study. Favor benign granuloma. Otherwise, the lungs are clear. No pleural effusion. Musculoskeletal: No acute fracture or aggressive appearing lytic or blastic osseous lesion. CT ABDOMEN PELVIS FINDINGS Hepatobiliary: Stable circumscribed low-attenuation cystic lesion measuring 9 mm in hepatic segment 2. Although too small for accurate characterization, this is favored to represent a benign cyst. Approximately 1.4 x 1.5 cm region of geographic hypoattenuation along the medial aspect of hepatic segment 4A at the fissure for the falciform ligament likely represents region of focal fatty infiltration. Otherwise, no discrete hepatic lesions are identified. The gallbladder is absent. No intra or extrahepatic biliary ductal dilatation. The portal veins are patent. Pancreas: Unremarkable. No pancreatic ductal dilatation or surrounding inflammatory changes. Spleen: Normal in size without focal abnormality. Adrenals/Urinary Tract: Both adrenal glands are normal. No enhancing renal mass  in either kidney. Stable punctate stone in the lower pole collecting system on the left. Stable minimally complex 2 cm cyst exophytic from the lower pole of the left kidney. No evidence of enhancement compared with the prior noncontrast study. This likely represents a minimally complex cyst. However, there is a new 1.7 x 1.2 x 2.0 cm intermediate attenuation density which abuts the medial upper pole cortex of the right kidney. This appears to be centered within the perinephric fat and abutting the kidney rather than emanating from the kidney was not clearly evident on the prior study. Small parapelvic renal sinus cysts bilaterally. Unremarkable ureters and bladder. Stomach/Bowel: Large ulcerated soft tissue mass arising from the gastroesophageal junction. This mass is difficult to measure by CT imaging but measures approximately 7.9 x 5.2 cm on the axial images. The remainder of the small bowel and colon demonstrate no additional regions of focal wall thickening. There is no evidence of obstruction. Normal appendix in the right lower quadrant. Vascular/Lymphatic: No significant atherosclerotic plaque or evidence of vascular aneurysm. Markedly abnormal retroperitoneal and upper abdominal lymph nodes consistent with metastatic disease. Small, but round and borderline enlarged nodes in the gastrohepatic ligament. An index node measures 1.4 cm on image 60 of series 2. This has enlarged compared to 0.9 cm previously. Celiac station lymph nodes are also enlarged compared to prior. Index nodes measure 2.4 x 4.0 cm (image 62 series 2) compared to 1.9 x 3.5 cm and 3.3 x 2.4 cm (image 63) compared to 1.5 x by 1.2 cm respectively. Left para-aortic retroperitoneal adenopathy remain similar to slightly progressed. The 1.3 cm node is essentially unchanged in size while an adjacent smaller noted has enlarged now measuring 1.0 cm compared to 0.8 cm previously. Reproductive: Prostate is unremarkable. Other: No abdominal wall hernia  or abnormality. No abdominopelvic ascites. Musculoskeletal: No acute fracture or aggressive appearing lytic or blastic osseous lesion. Chronic bilateral L5 pars defects with mild grade 1 anterolisthesis of L5 on S1. Focal degenerative disc disease at L5-S1. IMPRESSION: 1. Large, irregular ulcerated mass at the gastroesophageal junction consistent with the clinical history of esophageal cancer. There is evidence of a multifocal metastatic adenopathy involving the mediastinum, retrocrural, gastrohepatic ligament, celiac and retroperitoneal/para-aortic nodal stations. There has been a significant increase in size of the lymph nodes identified on the  relatively recent prior scan from 09/07/2016 concerning for progression of disease. 2. New 2 cm enhancing soft tissue density abutting the medial upper pole of the right kidney. This appears to be centered within the perinephric fat and abutting the kidney rather than emanating exophytically from the kidney and is concerning for a metastatic implant. 3. Nonspecific region of geographic hypoattenuation in the medial aspect of hepatic segment 4A along the fissure for the falciform ligament. Typically, this would be most consistent with a region of focal fatty infiltration. While that is favored, in the setting of malignancy, metastatic disease is difficult to exclude entirely. Recommend attention on follow-up imaging. No other liver lesions are identified at this time. 4. Minimally complex benign proteinaceous or hemorrhagic cyst exophytic from the lower pole of the left kidney. No evidence of enhancement on today's study. 5. Left lower pole nephrolithiasis. 6. Bilateral renal sinus cysts. 7. Chronic bilateral L5 pars defects with mild grade 1 anterolisthesis of L5 on S1 an associated focal degenerative disc disease. 8. Stable 4 mm likely benign granuloma in the right lower lobe which has been present back to February of 2007. No additional pulmonary nodules to suggest  pulmonary metastatic disease. Electronically Signed   By: Jacqulynn Cadet M.D.   On: 10/25/2016 10:02   Ct Abdomen Pelvis W Contrast  Result Date: 10/25/2016 CLINICAL DATA:  56 year old male with newly diagnosed esophageal mass by EGD. Clinical symptoms include weight loss, epigastric pain and difficulty swallowing for the past several months. EXAM: CT CHEST, ABDOMEN, AND PELVIS WITH CONTRAST TECHNIQUE: Multidetector CT imaging of the chest, abdomen and pelvis was performed following the standard protocol during bolus administration of intravenous contrast. CONTRAST:  199m ISOVUE-300 IOPAMIDOL (ISOVUE-300) INJECTION 61% COMPARISON:  Prior CT scan of the abdomen and pelvis 09/07/2016; prior CT scan of the chest 07/06/2005 FINDINGS: CT CHEST FINDINGS Cardiovascular: Conventional 3 vessel arch anatomy. No evidence of aortic aneurysm or dissection. The heart is normal in size. No pericardial effusion. Unremarkable pulmonary artery is and venous structures. Mediastinum/Nodes: Positive for mediastinal adenopathy which was not present on the remote prior study from 2007. Index nodes include a right paratracheal lymph node measuring 1.3 cm in short axis (image 17 series 2) and a paraesophageal lymph node measuring 1.2 cm in short axis (image 31 series 2) and additional para-aortic lymph node is present in the posterior mediastinum an is enlarged at 1.6 cm compared to 1.4 cm on the CT scan from 09/07/2016. Lungs/Pleura: 4 mm nodular opacity in the right lower lobe (image 40, series 3) was likely present on the prior study from 2007 although the region was limited by motion artifact on the prior study. Favor benign granuloma. Otherwise, the lungs are clear. No pleural effusion. Musculoskeletal: No acute fracture or aggressive appearing lytic or blastic osseous lesion. CT ABDOMEN PELVIS FINDINGS Hepatobiliary: Stable circumscribed low-attenuation cystic lesion measuring 9 mm in hepatic segment 2. Although too small for  accurate characterization, this is favored to represent a benign cyst. Approximately 1.4 x 1.5 cm region of geographic hypoattenuation along the medial aspect of hepatic segment 4A at the fissure for the falciform ligament likely represents region of focal fatty infiltration. Otherwise, no discrete hepatic lesions are identified. The gallbladder is absent. No intra or extrahepatic biliary ductal dilatation. The portal veins are patent. Pancreas: Unremarkable. No pancreatic ductal dilatation or surrounding inflammatory changes. Spleen: Normal in size without focal abnormality. Adrenals/Urinary Tract: Both adrenal glands are normal. No enhancing renal mass in either kidney. Stable punctate stone  in the lower pole collecting system on the left. Stable minimally complex 2 cm cyst exophytic from the lower pole of the left kidney. No evidence of enhancement compared with the prior noncontrast study. This likely represents a minimally complex cyst. However, there is a new 1.7 x 1.2 x 2.0 cm intermediate attenuation density which abuts the medial upper pole cortex of the right kidney. This appears to be centered within the perinephric fat and abutting the kidney rather than emanating from the kidney was not clearly evident on the prior study. Small parapelvic renal sinus cysts bilaterally. Unremarkable ureters and bladder. Stomach/Bowel: Large ulcerated soft tissue mass arising from the gastroesophageal junction. This mass is difficult to measure by CT imaging but measures approximately 7.9 x 5.2 cm on the axial images. The remainder of the small bowel and colon demonstrate no additional regions of focal wall thickening. There is no evidence of obstruction. Normal appendix in the right lower quadrant. Vascular/Lymphatic: No significant atherosclerotic plaque or evidence of vascular aneurysm. Markedly abnormal retroperitoneal and upper abdominal lymph nodes consistent with metastatic disease. Small, but round and borderline  enlarged nodes in the gastrohepatic ligament. An index node measures 1.4 cm on image 60 of series 2. This has enlarged compared to 0.9 cm previously. Celiac station lymph nodes are also enlarged compared to prior. Index nodes measure 2.4 x 4.0 cm (image 62 series 2) compared to 1.9 x 3.5 cm and 3.3 x 2.4 cm (image 63) compared to 1.5 x by 1.2 cm respectively. Left para-aortic retroperitoneal adenopathy remain similar to slightly progressed. The 1.3 cm node is essentially unchanged in size while an adjacent smaller noted has enlarged now measuring 1.0 cm compared to 0.8 cm previously. Reproductive: Prostate is unremarkable. Other: No abdominal wall hernia or abnormality. No abdominopelvic ascites. Musculoskeletal: No acute fracture or aggressive appearing lytic or blastic osseous lesion. Chronic bilateral L5 pars defects with mild grade 1 anterolisthesis of L5 on S1. Focal degenerative disc disease at L5-S1. IMPRESSION: 1. Large, irregular ulcerated mass at the gastroesophageal junction consistent with the clinical history of esophageal cancer. There is evidence of a multifocal metastatic adenopathy involving the mediastinum, retrocrural, gastrohepatic ligament, celiac and retroperitoneal/para-aortic nodal stations. There has been a significant increase in size of the lymph nodes identified on the relatively recent prior scan from 09/07/2016 concerning for progression of disease. 2. New 2 cm enhancing soft tissue density abutting the medial upper pole of the right kidney. This appears to be centered within the perinephric fat and abutting the kidney rather than emanating exophytically from the kidney and is concerning for a metastatic implant. 3. Nonspecific region of geographic hypoattenuation in the medial aspect of hepatic segment 4A along the fissure for the falciform ligament. Typically, this would be most consistent with a region of focal fatty infiltration. While that is favored, in the setting of malignancy,  metastatic disease is difficult to exclude entirely. Recommend attention on follow-up imaging. No other liver lesions are identified at this time. 4. Minimally complex benign proteinaceous or hemorrhagic cyst exophytic from the lower pole of the left kidney. No evidence of enhancement on today's study. 5. Left lower pole nephrolithiasis. 6. Bilateral renal sinus cysts. 7. Chronic bilateral L5 pars defects with mild grade 1 anterolisthesis of L5 on S1 an associated focal degenerative disc disease. 8. Stable 4 mm likely benign granuloma in the right lower lobe which has been present back to February of 2007. No additional pulmonary nodules to suggest pulmonary metastatic disease. Electronically Signed  By: Jacqulynn Cadet M.D.   On: 10/25/2016 10:02   Nm Pet Image Initial (pi) Skull Base To Thigh  Result Date: 11/01/2016 CLINICAL DATA:  Initial treatment strategy for gastroesophageal cancer at the GE junction. EXAM: NUCLEAR MEDICINE PET SKULL BASE TO THIGH TECHNIQUE: 12.3 mCi F-18 FDG was injected intravenously. Full-ring PET imaging was performed from the skull base to thigh after the radiotracer. CT data was obtained and used for attenuation correction and anatomic localization. FASTING BLOOD GLUCOSE:  Value: 115 mg/dl COMPARISON:  Multiple exams, including CT scan 10/25/2016 FINDINGS: NECK Right supraclavicular lymph node 0.9 cm in short axis on image 51/4, maximum SUV if 45.1. Left supraclavicular lymph node 0.7 cm in short axis on image 53/4, maximum SUV 21.3. CHEST Right upper paratracheal lymph node conglomerate with short axis diameter 1.3 cm on image 63/4, maximum SUV 48.7. Cluster of paraesophageal lymph nodes along the mid esophagus measuring up to 1.3 cm in short axis on image 79/4, maximum SUV 67.8. Gastroesophageal junction large ulcerated mass, maximum SUV 70.3. ABDOMEN/PELVIS Large gastroesophageal junction mass with ulceration. Adjacent right gastric and gastric omental lymph nodes as well as  peripancreatic lymph nodes measuring up to 3.1 cm in short axis on image 115/4, previously 3.0 cm, maximum SUV 72.6. Right retrocrural lymph node 1.5 cm (stable size) in short axis on image 111/4, maximum SUV 60.3. Left periaortic hypermetabolic adenopathy, index node 1.4 cm in short axis on image 134/4, maximum SUV 51.5. A focus of hypermetabolic activity in the right hepatic lobe has a maximum SUV of 34.2, compatible with metastatic disease. The small lesion previously seen in the dome of the left hepatic lobe is not currently perceptibly hypermetabolic. A hypermetabolic focus in the right perirenal space measures 1.3 by 1.9 cm on image 123/4 and has a maximum SUV of 51.7, compatible with metastatic deposit. 2 mm left kidney lower pole nonobstructive renal calculus. SKELETON No focal hypermetabolic activity to suggest skeletal metastasis. Along the anterior margin of the right scapula near the base of the acromion, there is a small but hyperintense focus of activity with maximum SUV 27.7, compatible with a metastatic deposit. IMPRESSION: 1. Large ulcerated mass of the gastroesophageal junction, with a single metastatic lesion in the liver; it metastatic lesion within or along the right scapula; and in various nodal chains in the supraclavicular regions, chest, and upper abdomen as detailed above. There is also a metastatic focus to the right perirenal space. 2. 2 mm left kidney lower pole nonobstructive renal calculus. Electronically Signed   By: Van Clines M.D.   On: 11/01/2016 10:07       IMPRESSION/PLAN: 71. 56 year-old gentleman with Stage IV Gastroesophageal carcinoma-adenocarcinoma.   Today, we talked to the patient and family about the findings and workup thus far. We discussed the natural history of metastatic gastroesophageal carcinoma and general treatment, highlighting the role of radiotherapy in the management. We discussed the available radiation techniques, and focused on the details of  logistics and delivery. We reviewed the anticipated acute and late sequelae associated with radiation in this setting. Currently, the recommendation is to proceed with systemic chemotherapy for disease management and control.  However, we discussed that should the dysphagia become more problematic and/or depending on disease response to chemotherapy, we would be happy to offer radiotherapy in addition to his systemic treatment but that is not clinically indicated at this time.  Instead, we focused on the role of consolidative radiotherapy as an adjuvant therapy once he has completed chemotherapy. The  patient was encouraged to ask questions that were answered to his satisfaction. He is scheduled for follow up with Dr. Learta Codding this Thursday, 6/28 for further discussion of systemic therapy options. He is scheduled for a consultation/2nd opinion at [facility] on 11/11/2016 to discuss potential candidacy for clinical trial enrollment. We will continue to monitor his progress closely via correspondence with medical oncology.  He will return for follow up in our clinic in 4 months for consideration of consolidative radiotherapy as an adjuvant treatment to reduce the risk of disease recurrence.    The above documentation reflects my direct findings during this shared patient visit. Please see the separate note by Dr. Lisbeth Renshaw on this date for the remainder of the patient's plan of care.    Nicholos Johns, PA-C  This document serves as a record of services personally performed by Kyung Rudd, MD and Freeman Caldron, PA-C. It was created on their behalf by Arlyce Harman, a trained medical scribe. The creation of this record is based on the scribe's personal observations and the provider's statements to them. This document has been checked and approved by the attending provider.

## 2016-11-03 ENCOUNTER — Telehealth: Payer: Self-pay | Admitting: *Deleted

## 2016-11-03 NOTE — Telephone Encounter (Signed)
Message from wife requesting medical records to be available during office visit tomorrow so they can take them to [facility] for appt there. Informed her we use the same system, records are available for review. Called radiology to prepare CD with CT/PET for pick up.

## 2016-11-04 ENCOUNTER — Ambulatory Visit (HOSPITAL_BASED_OUTPATIENT_CLINIC_OR_DEPARTMENT_OTHER): Payer: BLUE CROSS/BLUE SHIELD | Admitting: Oncology

## 2016-11-04 ENCOUNTER — Telehealth: Payer: Self-pay | Admitting: Oncology

## 2016-11-04 VITALS — BP 128/79 | HR 96 | Temp 99.0°F | Resp 20 | Ht 68.0 in | Wt 241.8 lb

## 2016-11-04 DIAGNOSIS — C160 Malignant neoplasm of cardia: Secondary | ICD-10-CM | POA: Diagnosis not present

## 2016-11-04 DIAGNOSIS — C787 Secondary malignant neoplasm of liver and intrahepatic bile duct: Secondary | ICD-10-CM | POA: Diagnosis not present

## 2016-11-04 DIAGNOSIS — C778 Secondary and unspecified malignant neoplasm of lymph nodes of multiple regions: Secondary | ICD-10-CM | POA: Diagnosis not present

## 2016-11-04 NOTE — Progress Notes (Signed)
START ON PATHWAY REGIMEN - Gastroesophageal     A cycle is every 14 days:     Oxaliplatin      Leucovorin      5-Fluorouracil      5-Fluorouracil   **Always confirm dose/schedule in your pharmacy ordering system**    Patient Characteristics: Distant Metastases (cM1/pM1) / Locally Recurrent Disease, Adenocarcinoma - Esophageal, GE Junction, and Gastric, First Line, HER2 Negative / Unknown Histology: Adenocarcinoma Disease Classification: GE Junction Therapeutic Status: Distant Metastases (No Additional Staging) Would you be surprised if this patient died  in the next year? I would be surprised if this patient died in the next year Line of therapy: First Line HER2 Status: Negative Intent of Therapy: Non-Curative / Palliative Intent, Discussed with Patient

## 2016-11-04 NOTE — Progress Notes (Signed)
[facility] OFFICE PROGRESS NOTE   Diagnosis: Gastroesophageal carcinoma  INTERVAL HISTORY:   Jose Hayes returns as scheduled. He continues to have a low-grade fever in the afternoon and evening. He has solid dysphagia. Pain is relieved with tramadol. He saw Dr. Lisbeth Renshaw 11/02/2016. Radiation is not recommended at present. Jose Hayes is scheduled for an appointment with Dr.Uronis on 11/11/2016.   Objective:  Vital signs in last 24 hours:  Blood pressure 128/79, pulse 96, temperature 99 F (37.2 C), temperature source Oral, resp. rate 20, height '5\' 8"'  (1.727 m), weight 241 lb 12.8 oz (109.7 kg), SpO2 100 %.    HEENT: Neck without mass  Lymphatics: No cervical or supraclavicular nodes  Resp: Lungs clear bilaterally  Cardio: Regular rate and rhythm  GI: No hepatosplenomegaly, mild tenderness in the xiphoid region, no mass  Vascular: No leg edema    Lab Results:  Lab Results  Component Value Date   WBC 8.3 10/13/2016   HGB 13.3 10/13/2016   HCT 40.2 10/13/2016   MCV 83.8 10/13/2016   PLT 292.0 10/13/2016   NEUTROABS 5.4 10/13/2016     Imaging:  Nm Pet Image Initial (pi) Skull Base To Thigh  Result Date: 11/01/2016 CLINICAL DATA:  Initial treatment strategy for gastroesophageal cancer at the GE junction. EXAM: NUCLEAR MEDICINE PET SKULL BASE TO THIGH TECHNIQUE: 12.3 mCi F-18 FDG was injected intravenously. Full-ring PET imaging was performed from the skull base to thigh after the radiotracer. CT data was obtained and used for attenuation correction and anatomic localization. FASTING BLOOD GLUCOSE:  Value: 115 mg/dl COMPARISON:  Multiple exams, including CT scan 10/25/2016 FINDINGS: NECK Right supraclavicular lymph node 0.9 cm in short axis on image 51/4, maximum SUV if 45.1. Left supraclavicular lymph node 0.7 cm in short axis on image 53/4, maximum SUV 21.3. CHEST Right upper paratracheal lymph node conglomerate with short axis diameter 1.3 cm on image 63/4,  maximum SUV 48.7. Cluster of paraesophageal lymph nodes along the mid esophagus measuring up to 1.3 cm in short axis on image 79/4, maximum SUV 67.8. Gastroesophageal junction large ulcerated mass, maximum SUV 70.3. ABDOMEN/PELVIS Large gastroesophageal junction mass with ulceration. Adjacent right gastric and gastric omental lymph nodes as well as peripancreatic lymph nodes measuring up to 3.1 cm in short axis on image 115/4, previously 3.0 cm, maximum SUV 72.6. Right retrocrural lymph node 1.5 cm (stable size) in short axis on image 111/4, maximum SUV 60.3. Left periaortic hypermetabolic adenopathy, index node 1.4 cm in short axis on image 134/4, maximum SUV 51.5. A focus of hypermetabolic activity in the right hepatic lobe has a maximum SUV of 34.2, compatible with metastatic disease. The small lesion previously seen in the dome of the left hepatic lobe is not currently perceptibly hypermetabolic. A hypermetabolic focus in the right perirenal space measures 1.3 by 1.9 cm on image 123/4 and has a maximum SUV of 51.7, compatible with metastatic deposit. 2 mm left kidney lower pole nonobstructive renal calculus. SKELETON No focal hypermetabolic activity to suggest skeletal metastasis. Along the anterior margin of the right scapula near the base of the acromion, there is a small but hyperintense focus of activity with maximum SUV 27.7, compatible with a metastatic deposit. IMPRESSION: 1. Large ulcerated mass of the gastroesophageal junction, with a single metastatic lesion in the liver; it metastatic lesion within or along the right scapula; and in various nodal chains in the supraclavicular regions, chest, and upper abdomen as detailed above. There is also a metastatic focus to  the right perirenal space. 2. 2 mm left kidney lower pole nonobstructive renal calculus. Electronically Signed   By: Van Clines M.D.   On: 11/01/2016 10:07    Medications: I have reviewed the patient's current  medications.  Assessment/Plan: 1. Gastroesophageal carcinoma-adenocarcinoma, HER-2 negative  ? Upper endoscopy 10/21/2016 with tumor noted beginning at the distal esophagus, extending into the gastric cardia/fundus ? Staging CT scans 10/25/2016-large mass at the GE junction, metastatic adenopathy in the mediastinum, retrocrural, gastric hepatic ligament, celiac, and retroperitoneal stations. New enhancing soft tissue density adjacent to the upper pole of right kidney suspicious for a metastasis ? PET scan 11/01/2016-GE junction mass, single liver metastasis, metastasis near the right scapula, multiple abdominal/chest metastatic lymph nodes  2. Abdominal/back pain secondary to #1  3.   Weight loss  4.    Report of low-grade fever-likely secondary to #1  5.    Tobacco use  6.    History of SVT  7.   History of gastroesophageal reflux  8.   Hypertension    Disposition:  Jose Hayes has been diagnosed with metastatic gastroesophageal carcinoma. I discussed the diagnosis and treatment options with Jose Hayes and his wife. I recommend systemic therapy. He is scheduled for an appointment with Dr.  Fanny Skates next week. I recommend standard treatment with FOLFOX if he does not qualify for a clinical trial.  We reviewed the potential toxicities associated with the FOLFOX regimen including the chance for nausea/vomiting, mucositis, alopecia, diarrhea, and hematologic toxicity. We discussed the rash, sun sensitivity, hyperpigmentation, and hand/foot syndrome associated with 5 fluorouracil. We reviewed the allergic reaction in various types of neuropathy seen with oxaliplatin. He agrees to proceed. He will attend a chemotherapy teaching class.  Jose Hayes will be referred for placement of a Port-A-Cath. The plan is to begin a first cycle of FOLFOX chemotherapy on 11/17/2016.  25 minutes were spent with the patient today. The majority of the time was used for counseling and coordination  of care.  Donneta Romberg, MD  11/04/2016  11:56 AM

## 2016-11-04 NOTE — Telephone Encounter (Signed)
Scheduled appt per 6/28 los - Gave patient AVS and calender per los. Unable to scheduled treatment due to capped day - [employer].

## 2016-11-05 ENCOUNTER — Telehealth: Payer: Self-pay | Admitting: Oncology

## 2016-11-05 NOTE — Telephone Encounter (Signed)
Scheduled appt per 6/28 los for treatment on 7/12 per [place] - left message with appt date and time.

## 2016-11-09 ENCOUNTER — Other Ambulatory Visit: Payer: BLUE CROSS/BLUE SHIELD

## 2016-11-11 ENCOUNTER — Other Ambulatory Visit: Payer: Self-pay | Admitting: Radiology

## 2016-11-12 ENCOUNTER — Ambulatory Visit: Payer: BLUE CROSS/BLUE SHIELD | Admitting: Nurse Practitioner

## 2016-11-12 ENCOUNTER — Other Ambulatory Visit (HOSPITAL_COMMUNITY): Payer: BLUE CROSS/BLUE SHIELD

## 2016-11-12 ENCOUNTER — Telehealth: Payer: Self-pay | Admitting: Oncology

## 2016-11-12 ENCOUNTER — Ambulatory Visit (HOSPITAL_COMMUNITY): Payer: BLUE CROSS/BLUE SHIELD

## 2016-11-12 NOTE — Telephone Encounter (Signed)
Patient's wife called and said that they went to another doctor yesterday and was giving a trial option and was told to think about it over the weekend.  She said that she wanted to cancel the treatment on 7/12 so that someone else could have the spot.  I didn't not cancel in case they wanted to go through with treatment here

## 2016-11-16 NOTE — Telephone Encounter (Signed)
Telephone call to patient to confirm he would like to cancel his infusion appt. Patient states after careful consideration they are going to [facility] to participate in the clinical trial he was offered. Patient will follow up with [place] and call if he has any additional concerns or questions.

## 2016-11-17 ENCOUNTER — Encounter: Payer: Self-pay | Admitting: *Deleted

## 2016-11-17 NOTE — Progress Notes (Signed)
[place] Psychosocial Distress Screening Clinical Social Work  Clinical Social Work was referred by distress screening protocol.  The patient scored a 5 on the Psychosocial Distress Thermometer which indicates moderate distress. Clinical Social Worker reviewed chart and pt has decided to seek care at [facility] and has no future appointments scheduled at [facility]. No follow up at this time.  ONCBCN DISTRESS SCREENING 11/02/2016  Screening Type Initial Screening  Distress experienced in past week (1-10) 5  Emotional problem type Nervousness/Anxiety;Adjusting to illness  Information Concerns Type Lack of info about treatment;Lack of info about complementary therapy choices;Lack of info about maintaining fitness  Physical Problem type Pain;Mouth sores/swallowing;Loss of appetitie  Physician notified of physical symptoms Yes  Referral to clinical social work Yes  Referral to dietition Yes    Clinical Social Worker follow up needed: No.  If yes, follow up plan:  Loren Racer, Marlinda Mike, OSW-C Clinical Social Worker [place]  [facility] Phone: [phone] Fax: [phone]

## 2016-11-18 ENCOUNTER — Ambulatory Visit: Payer: BLUE CROSS/BLUE SHIELD

## 2016-11-23 ENCOUNTER — Encounter (HOSPITAL_COMMUNITY): Payer: Self-pay

## 2016-12-22 ENCOUNTER — Encounter: Payer: Self-pay | Admitting: Family Medicine

## 2016-12-22 ENCOUNTER — Ambulatory Visit (INDEPENDENT_AMBULATORY_CARE_PROVIDER_SITE_OTHER): Payer: BLUE CROSS/BLUE SHIELD | Admitting: Family Medicine

## 2016-12-22 DIAGNOSIS — C160 Malignant neoplasm of cardia: Secondary | ICD-10-CM

## 2016-12-22 NOTE — Assessment & Plan Note (Signed)
Patient with fairly recent diagnosis of gastroesophageal cancer. Undergoing treatment at [facility]. Appears to be progressing fairly well and tolerating treatments fairly well. Symptoms are well controlled with his current medications. Discussed continuing to follow with them. Patient does note some reflux symptoms. He is going to discuss this tomorrow when he is evaluated at the [facility].

## 2016-12-22 NOTE — Progress Notes (Signed)
Tommi Rumps, MD Phone: [phone]  Jose Hayes is a 56 y.o. male who presents today for new patient visit.  Patient somewhat recently diagnosed with gastroesophageal junction cancer. He's been following with [facility] for this. He is currently on a stage II trial with them. He is currently on keytruda, FOLFOX, and Xeloda. He has done well with this. His upper abdominal pain has improved quite a bit with this treatment. He has noted some constipation and he was on Senokot though that has gotten better and is having daily bowel movements. He notes some intermittent nausea following his treatments and he gets dexamethasone for that and he is also on Zyprexa and Zofran for nausea. He is eating small meals 4-5 times a day. He has been able to put some weight back on his well. Overall appears to be progressing well.  Active Ambulatory Problems    Diagnosis Date Noted  . Hyperlipidemia 12/01/2011  . SVT (supraventricular tachycardia) ([place]) 12/01/2011  . Smoking 12/01/2011  . Obesity 12/01/2011  . Hypertension 12/01/2011  . Restless leg syndrome 09/04/2015  . Kidney stones 10/13/2016  . Gastroesophageal cancer ([place]) 11/02/2016   Resolved Ambulatory Problems    Diagnosis Date Noted  . No Resolved Ambulatory Problems   Past Medical History:  Diagnosis Date  . Allergy   . Esophageal cancer ([place]) 10/21/2016  . GERD (gastroesophageal reflux disease)   . Hyperglycemia   . Hyperlipidemia   . Hypertension   . SVT (supraventricular tachycardia) ([place])   . Tobacco abuse     Family History  Problem Relation Age of Onset  . Hypertension Mother   . Hypertension Father   . Pancreatic cancer Father   . Clotting disorder Father        renal cell ca  . Colon cancer Neg Hx   . Esophageal cancer Neg Hx   . Prostate cancer Neg Hx   . Rectal cancer Neg Hx   . Stomach cancer Neg Hx     Social History   Social History  . Marital status: Married    Spouse name: N/A  . Number of  children: N/A  . Years of education: N/A   Occupational History  . Not on file.   Social History Main Topics  . Smoking status: Former Smoker    Packs/day: 0.50    Years: 25.00    Types: Cigarettes  . Smokeless tobacco: Never Used     Comment: trying gum to quit  . Alcohol use Yes     Comment: occasionally  . Drug use: No  . Sexual activity: Not on file   Other Topics Concern  . Not on file   Social History Narrative  . No narrative on file    ROS  General:  Positive for unexplained weight loss (related to gastric cancer) negative for, fever Skin: Negative for new or changing mole, sore that won't heal HEENT: Negative for trouble hearing, trouble seeing, ringing in ears, mouth sores, hoarseness, change in voice, dysphagia. CV:  Negative for chest pain, dyspnea, edema, palpitations Resp: Negative for cough, dyspnea, hemoptysis GI: Positive for nausea, constipation, abdominal pain, negative for vomiting, diarrhea, melena, hematochezia. GU: Negative for dysuria, incontinence, urinary hesitance, hematuria, vaginal or penile discharge, polyuria, sexual difficulty, lumps in testicle or breasts MSK: Negative for muscle cramps or aches, joint pain or swelling Neuro: Negative for headaches, weakness, numbness, dizziness, passing out/fainting Psych: Negative for depression, anxiety, memory problems  Objective  Physical Exam Vitals:   12/22/16 1340  BP:  120/66  Pulse: 83  Temp: 97.8 F (36.6 C)  SpO2: 95%    BP Readings from Last 3 Encounters:  12/22/16 120/66  11/04/16 128/79  11/02/16 (!) 147/93   Wt Readings from Last 3 Encounters:  12/22/16 232 lb 3.2 oz (105.3 kg)  11/04/16 241 lb 12.8 oz (109.7 kg)  11/02/16 244 lb 3.2 oz (110.8 kg)    Physical Exam  Constitutional: No distress.  HENT:  Head: Normocephalic and atraumatic.  Mouth/Throat: Oropharynx is clear and moist. No oropharyngeal exudate.  Eyes: Pupils are equal, round, and reactive to light.  Conjunctivae are normal.  Cardiovascular: Normal rate, regular rhythm and normal heart sounds.   Pulmonary/Chest: Effort normal and breath sounds normal.  Abdominal: Soft. Bowel sounds are normal. He exhibits no distension. There is no tenderness. There is no rebound and no guarding.  Musculoskeletal: He exhibits no edema.  Neurological: He is alert. Gait normal.  Skin: Skin is warm and dry. He is not diaphoretic.  Psychiatric: Mood and affect normal.     Assessment/Plan:   Gastroesophageal cancer [facility]) Patient with fairly recent diagnosis of gastroesophageal cancer. Undergoing treatment at [facility] Appears to be progressing fairly well and tolerating treatments fairly well. Symptoms are well controlled with his current medications. Discussed continuing to follow with them. Patient does note some reflux symptoms. He is going to discuss this tomorrow when he is evaluated at the [place] at [facility].  Tommi Rumps, MD [place]

## 2016-12-22 NOTE — Patient Instructions (Signed)
Nice to meet you. Please continue to follow with [facility].

## 2017-06-21 ENCOUNTER — Telehealth: Payer: Self-pay

## 2017-06-21 NOTE — Telephone Encounter (Signed)
Given his symptoms I would suggest having him seen at urgent care to evaluate for appropriate treatment. If he declines going to urgent care I can see him on Friday during one of the open times in the afternoon.

## 2017-06-21 NOTE — Telephone Encounter (Signed)
Left voice mail to call back ok for [facility] to speak to patient 

## 2017-06-21 NOTE — Telephone Encounter (Signed)
Fertile wife. Patient is receiving treatment for cancer clinical trial /chemo pill at [facility], they suggested patient call PCP for [facility] , metabolic panel , they didn't want patient to have to drive to [facility] for bloodwork  Reason for call: fatigue ,headaches Symptoms: fatigue, body aches, headaches , no fever, had flu shot  Duration 7 days  Medications: Ibuprofen 800 mg   Last seen for this problem: Seen by:

## 2017-06-21 NOTE — Telephone Encounter (Signed)
Please advise 

## 2017-06-22 NOTE — Telephone Encounter (Signed)
FYI

## 2017-06-22 NOTE — Telephone Encounter (Signed)
Patient returned call, advised of Dr. Caryl Bis message noted 06/21/17, he said he will go to the urgent care and if it is too full, he will call back for an appointment.

## 2017-06-22 NOTE — Telephone Encounter (Signed)
fyi

## 2017-06-22 NOTE — Telephone Encounter (Signed)
Noted. Thanks.

## 2017-06-24 ENCOUNTER — Ambulatory Visit: Payer: BLUE CROSS/BLUE SHIELD | Admitting: Family Medicine

## 2017-07-11 ENCOUNTER — Other Ambulatory Visit: Payer: Self-pay

## 2017-07-11 MED ORDER — VERAPAMIL HCL 40 MG PO TABS: 40.0000 mg | ORAL_TABLET | ORAL | 0 refills | Status: DC | PRN

## 2017-07-11 NOTE — Telephone Encounter (Signed)
*  STAT* If patient is at the pharmacy, call can be transferred to refill team.   1. Which medications need to be refilled? (please list name of each medication and dose if known) verapamil  2. Which pharmacy/location (including street and city if local pharmacy) is medication to be sent to?[facility]  3. Do they need a 30 day or 90 day supply? [place]

## 2017-07-18 ENCOUNTER — Telehealth: Payer: Self-pay | Admitting: Cardiovascular Disease

## 2017-07-18 NOTE — Telephone Encounter (Signed)
°*  STAT* If patient is at the pharmacy, call can be transferred to refill team.   1. Which medications need to be refilled? (please list name of each medication and dose if known)  Verapamil 240 mg (Not fast acting one)   2. Which pharmacy/location (including street and city if local pharmacy) is [facility] [address]   3. Do they need a 30 day or 90 day supply? 90 day

## 2017-07-19 NOTE — Telephone Encounter (Signed)
Medication was already sent in 07/11/2017   verapamil (CALAN) 40 MG tablet 90 tablet 0 07/11/2017    Sig - Route: Take 1 tablet (40 mg total) by mouth as needed. - Oral   Sent to pharmacy as: verapamil (CALAN) 40 MG tablet   E-Prescribing Status: Receipt confirmed by pharmacy (07/11/2017 4:56 PM EST)   Pharmacy   [id number] - [place], [place]

## 2017-07-29 ENCOUNTER — Other Ambulatory Visit: Payer: Self-pay

## 2017-07-29 MED ORDER — VERAPAMIL HCL ER 240 MG PO TBCR: 240.0000 mg | EXTENDED_RELEASE_TABLET | Freq: Every day | ORAL | 3 refills | Status: DC

## 2017-07-29 NOTE — Telephone Encounter (Signed)
Pt calling stating we are sent in the wrong kind of Verapamil in  Please send in 240 mg      *STAT* If patient is at the pharmacy, call can be transferred to refill team.   1. Which medications need to be refilled? (please list name of each medication and dose if known)  Verapamil 240 mg (Not fast acting one)   2. Which pharmacy/location (including street and city if local pharmacy) is [facility] [address]   3. Do they need a 30 day or 90 day supply? 90 day

## 2017-07-29 NOTE — Telephone Encounter (Signed)
Can you please review chart for patient on the correct Verapamil he should be taking? The patient was taking Verapamil 240 mg but at Dr. Donivan Scull last office visit the Verapamil 240 mg was taken off list and placed on verapamil 40 mg. Please contact patient for instructions.

## 2017-07-29 NOTE — Telephone Encounter (Signed)
Per Dr/ Donivan Scull 10/14/16 OV notes:  "ASSESSMENT AND PLAN:  SVT (supraventricular tachycardia) ([place]) Denies having any significant arrhythmia Stopped his verapamil out of concern of GI issues We have discussed various treatment options with him" We have provided a prescription for verapamil 40 mg pills to take as needed for breakthrough arrhythmia"  Verapamil 40mg  [facility] Patient calls today requesting verapamil 240mg  qd #90 to his pharmacy. This was his dose prior to stopping it on his own. He has a history of HTN and SVT I s/w patient. He did stop taking verapamil prior to his June appointment but restarted 4 months ago when he documented blood pressure around 150s/100s. With daily verapamil 120s/80s.   Reviewed with Dr. Rockey Situ who is agreeable to verapamil 240mg  QD refills. Sent to pharmacy. Patient aware.

## 2017-08-08 DIAGNOSIS — G4733 Obstructive sleep apnea (adult) (pediatric): Secondary | ICD-10-CM | POA: Insufficient documentation

## 2017-09-20 ENCOUNTER — Encounter: Payer: Self-pay | Admitting: Cardiovascular Disease

## 2017-09-20 ENCOUNTER — Ambulatory Visit: Payer: BLUE CROSS/BLUE SHIELD | Admitting: Cardiovascular Disease

## 2017-09-20 VITALS — BP 120/98 | HR 61 | Ht 69.0 in | Wt 262.5 lb

## 2017-09-20 DIAGNOSIS — I471 Supraventricular tachycardia: Secondary | ICD-10-CM

## 2017-09-20 DIAGNOSIS — E782 Mixed hyperlipidemia: Secondary | ICD-10-CM | POA: Diagnosis not present

## 2017-09-20 MED ORDER — FUROSEMIDE 20 MG PO TABS: ORAL_TABLET | ORAL | 5 refills | Status: DC

## 2017-09-20 NOTE — Patient Instructions (Addendum)
Medication Instructions:   Take lasix as needed for swelling Take with Banana  Labwork:  No new labs needed  Testing/Procedures:  No further testing at this time  If swelling gets worse, we could order an echocardiogram   Follow-Up: It was a pleasure seeing you in the office today. Please call us if you have new issues that need to be addressed before your next appt.  [phone]  Your physician wants you to follow-up in: 12 months.  You will receive a reminder letter in the mail two months in advance. If you don't receive a letter, please call our office to schedule the follow-up appointment.  If you need a refill on your cardiac medications before your next appointment, please call your pharmacy.  For educational health videos Log in to : [email or web address] Or : [email or web address], password : [email or web address]

## 2017-09-20 NOTE — Progress Notes (Signed)
Cardiology Office Note  Date:  09/20/2017   ID:  Jose Hayes, DOB 1960/09/27, MRN [id number]  PCP:  Jose Hayes   Chief Complaint  Patient presents with  . OTHER    12 month f/u no complications today. Meds reviewed verbally with pt.    HPI:  Jose Hayes is a very pleasant 57 year-old gentleman with  obesity,  smoking history who smokes one half pack per day [facility] 11/05/2011 with SVT. Rhythm broke to normal sinus rhythm in the emergency room. Adenosine may have worked He presents for routine followup of his arrhythmia  Difficult year Details discussed with him, diagnosed with stomach cancer Completed chemotherapy through [facility] clinical trial Developed Adrenal problem and thyrod issue  Currently feels relatively well Weight initially dropped now climbed back up Not very active at baseline Has peripheral neuropathy Swelling in his toes and feet  Recent imaging studies reviewed with him 08/18/17 CT CAP stable nonspecific pulmonary nodules, no evidence of metastatic disease in abdomen or pelvis. Previously seen hepatic and nodal metastases are no longer visualized  Denies any tachycardia Continues to work in Engineer, technical sales at [employer].  EKG personally reviewed by myself on todays visit Shows normal sinus rhythm rate 61 bpm no significant ST or T-wave changes   other past medical history  Echocardiogram was essentially normal with normal ejection fraction, normal biventricular systolic pressures.  Total cholesterol 160. Tolerating Lipitor 20 mg daily Reports having blood work done through his doctor at work   PMH:   has a past medical history of Allergy, Esophageal cancer ([place]) (10/21/2016), GERD (gastroesophageal reflux disease), Hyperglycemia, Hyperlipidemia, Hypertension, SVT (supraventricular tachycardia) ([place]), and Tobacco abuse.  PSH:    Past Surgical History:  Procedure Laterality Date  . COLONOSCOPY    . LAPAROSCOPIC CHOLECYSTECTOMY  1991  . SHOULDER  SURGERY Right 1985, 1991, 2000   left x3  . [place] teeth extraction    . UPPER GASTROINTESTINAL ENDOSCOPY      Current Outpatient Medications  Medication Sig Dispense Refill  . acetaminophen (TYLENOL) 500 MG tablet Take by mouth as needed.     . capecitabine (XELODA) 500 MG tablet Take 500 mg by mouth.    . dexamethasone (DECADRON) 4 MG tablet Take 4 mg by mouth as needed.     . gabapentin (NEURONTIN) 100 MG capsule Take 100 mg by mouth 2 (two) times daily.    Marland Kitchen HYDROcodone-acetaminophen (NORCO/VICODIN) 5-325 MG tablet Take by mouth.    . hydrocortisone (CORTEF) 10 MG tablet TAKE 1.5 TABS BY MOUTH EVERY MORNING THEN 1 TABLET IN THE EVENING  3  . levothyroxine (SYNTHROID, LEVOTHROID) 75 MCG tablet Take 75 mcg by mouth daily before breakfast.     . OLANZapine (ZYPREXA) 5 MG tablet Take 5 mg by mouth as needed.     . ondansetron (ZOFRAN) 8 MG tablet Take 1 tablet (8 mg) by mouth every 12 hours 30 minutes prior to each dose of oral chemotherapy to prevent nausea.    . pembrolizumab (KEYTRUDA) 100 MG/4ML SOLN Inject into the vein.    Marland Kitchen traMADol (ULTRAM) 50 MG tablet Take 1 tablet (50 mg total) by mouth 3 (three) times daily as needed. 60 tablet 1  . verapamil (CALAN) 40 MG tablet Take 1 tablet (40 mg total) by mouth as needed. 90 tablet 0  . verapamil (CALAN-SR) 240 MG CR tablet Take 1 tablet (240 mg total) by mouth at bedtime. 90 tablet 3  . zolpidem (AMBIEN CR) 6.25 MG CR tablet Take  6.25 mg by mouth as needed.     Current Facility-Administered Medications  Medication Dose Route Frequency Provider Last Rate Last Dose  . 0.9 %  sodium chloride infusion  500 mL Intravenous Continuous Ladene Artist, Hayes         Allergies:   Patient has no known allergies.   Social History:  The patient  reports that he has quit smoking. His smoking use included cigarettes. He has a 12.50 pack-year smoking history. He has never used smokeless tobacco. He reports that he drinks alcohol. He reports that he  does not use drugs.   Family History:   family history includes Clotting disorder in his father; Hypertension in his father and mother; Pancreatic cancer in his father.    Review of Systems: Review of Systems  Constitutional: Negative.        Weight gain  Respiratory: Negative.   Cardiovascular: Negative.        Swelling in his toes  Gastrointestinal: Negative.   Musculoskeletal: Negative.   Neurological: Negative.   Psychiatric/Behavioral: Negative.   All other systems reviewed and are negative.    PHYSICAL EXAM: VS:  BP (!) 120/98 (BP Location: Left Arm, Patient Position: Sitting, Cuff Size: Large)   Pulse 61   Ht 5\' 9"  (1.753 m)   Wt 262 lb 8 oz (119.1 kg)   BMI 38.76 kg/m  , BMI Body mass index is 38.76 kg/m. Constitutional:  oriented to person, place, and time. No distress. obese HENT:  Head: Normocephalic and atraumatic.  Eyes:  no discharge. No scleral icterus.  Neck: Normal range of motion. Neck supple. No JVD present.  Cardiovascular: Normal rate, regular rhythm, normal heart sounds and intact distal pulses. Exam reveals no gallop and no friction rub. No edema No murmur heard. Pulmonary/Chest: Effort normal and breath sounds normal. No stridor. No respiratory distress.  no wheezes.  no rales.  no tenderness.  Abdominal: Soft.  no distension.  no tenderness.  Musculoskeletal: Normal range of motion.  no  tenderness or deformity.  Neurological:  normal muscle tone. Coordination normal. No atrophy Skin: Skin is warm and dry. No rash noted. not diaphoretic.  Psychiatric:  normal mood and affect. behavior is normal. Thought content normal.       Recent Labs: 10/13/2016: ALT 22; BUN 16; Creatinine, Ser 0.96; Hemoglobin 13.3; Platelets 292.0; Potassium 4.3; Sodium 139    Lipid Panel Lab Results  Component Value Date   CHOL 191 11/06/2011   HDL 41 11/06/2011   LDLCALC 129 (H) 11/06/2011   TRIG 107 11/06/2011      Wt Readings from Last 3 Encounters:   09/20/17 262 lb 8 oz (119.1 kg)  12/22/16 232 lb 3.2 oz (105.3 kg)  11/04/16 241 lb 12.8 oz (109.7 kg)      ASSESSMENT AND PLAN:  SVT (supraventricular tachycardia) (HCC) Continue current medications, no recent episodes  Mixed hyperlipidemia No recent lipid panel available Recommended lifestyle modifications, weight loss, dietary changes He stopped his statin while undergoing treatment for stomach cancer  Stomach cancer Treated at [facility], clinical trial Most recent imaging very promising,no growth,  no metastasis  Essential hypertension Diastolic pressure mildly elevated  Smoking We have encouraged him to continue to work on weaning his cigarettes and smoking cessation. He will continue to work on this and does not want any assistance with chantix.     Total encounter time more than 25 minutes  Greater than 50% was spent in counseling and coordination of care with the  patient   Disposition:   F/U  12 months   Orders Placed This Encounter  Procedures  . EKG 12-Lead     Signed, Esmond Plants, M.D., Ph.D. 09/20/2017  [place], [place]

## 2018-02-01 IMAGING — CT CT CHEST W/ CM
2 of 6 series · 12 of 46 positions shown, 14 images · IV contrast (iopamidol)
Comparison: Prior CT scan of the abdomen and pelvis 09/07/2016;
prior CT scan of the chest 07/06/2005

CLINICAL DATA: 55-year-old male with newly diagnosed esophageal
mass by EGD. Clinical symptoms include weight loss, epigastric pain
and difficulty swallowing for the past several months.

EXAM:
CT CHEST, ABDOMEN, AND PELVIS WITH CONTRAST
TECHNIQUE: Multidetector CT imaging of the chest, abdomen and pelvis was
performed following the standard protocol during bolus
administration of intravenous contrast.
CONTRAST:  100mL [id number] IOPAMIDOL ([id number]) INJECTION 61%

[Series 4: thins · axial · 0.93mm/px · z∈[-634,-66]mm · 9 of 967 slices shown, 11 images]
[im 78/967  soft-tissue]
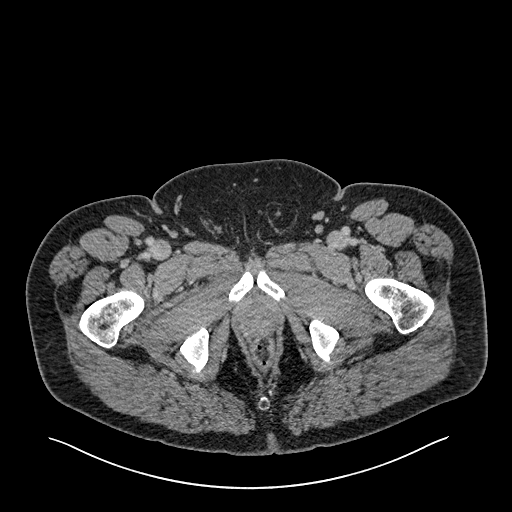
[im 78/967  bone]
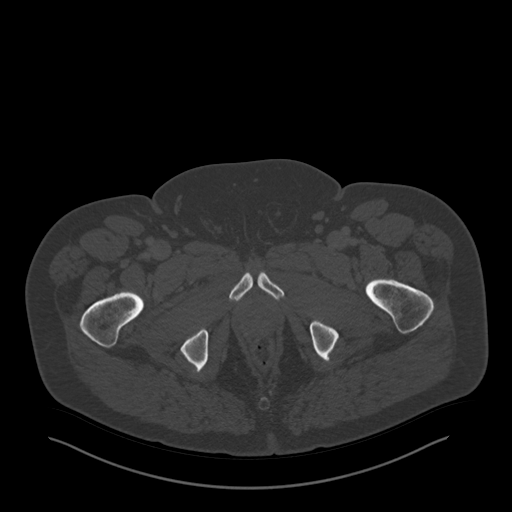
[im 194/967  soft-tissue]
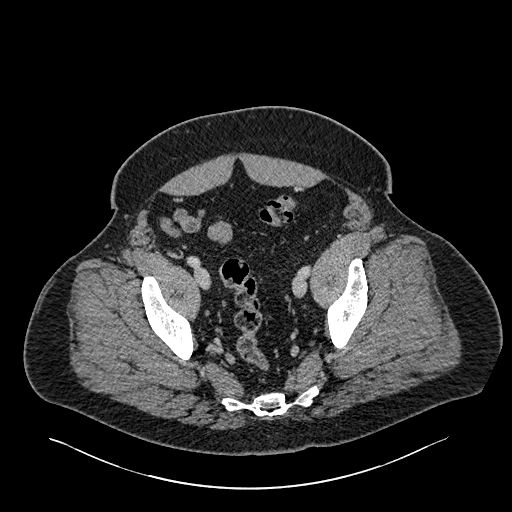
[im 271/967  soft-tissue]
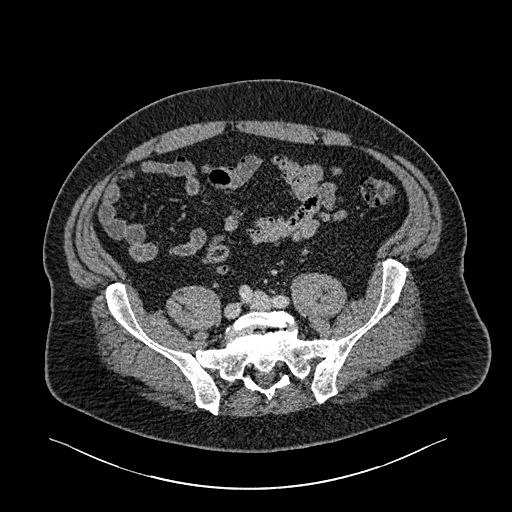
[im 387/967  soft-tissue]
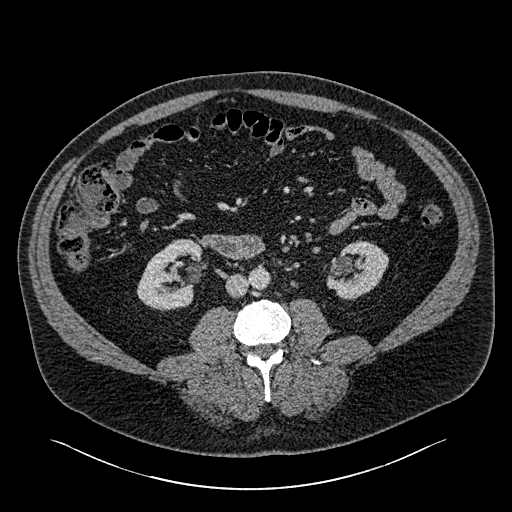
[im 503/967  soft-tissue]
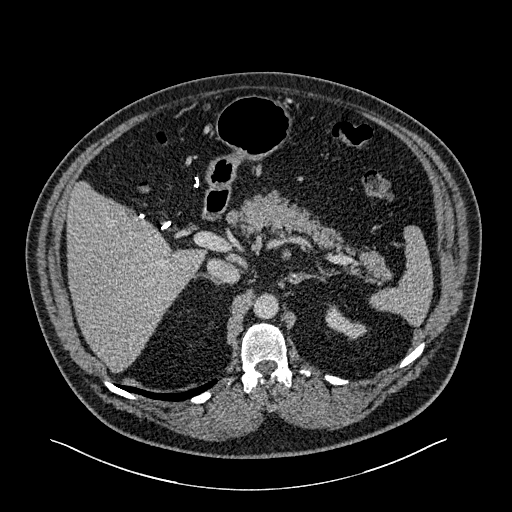
[im 580/967  soft-tissue]
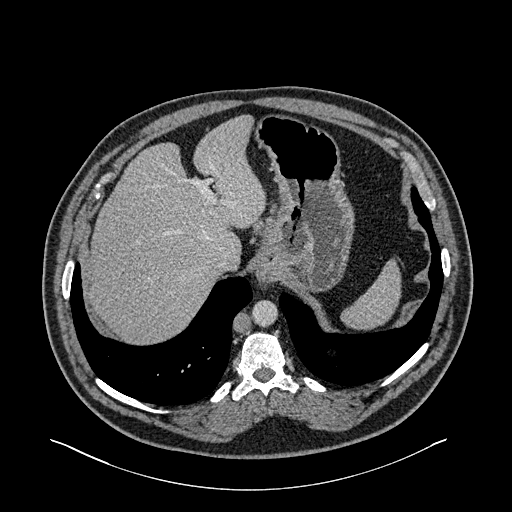
[im 696/967  soft-tissue]
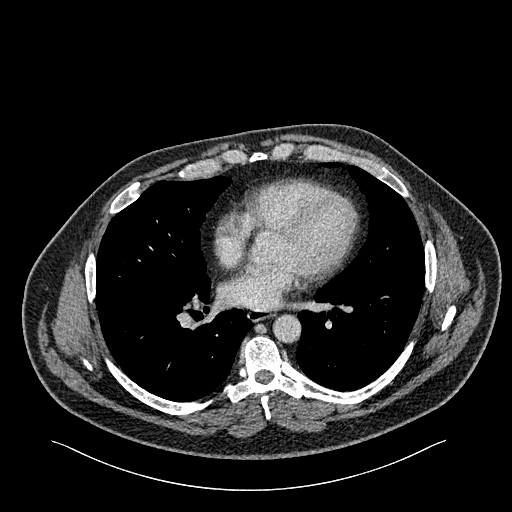
[im 812/967  soft-tissue]
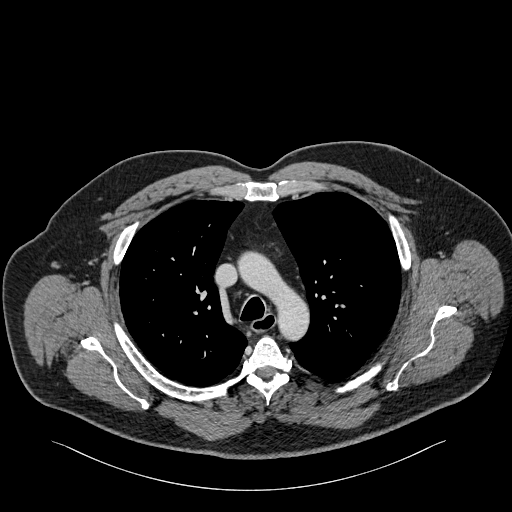
[im 889/967  soft-tissue]
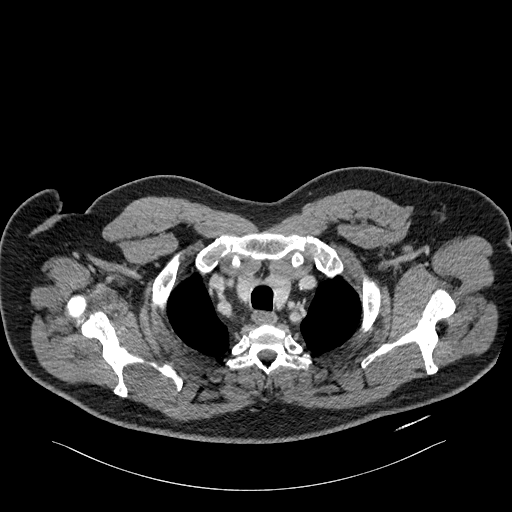
[im 889/967  bone]
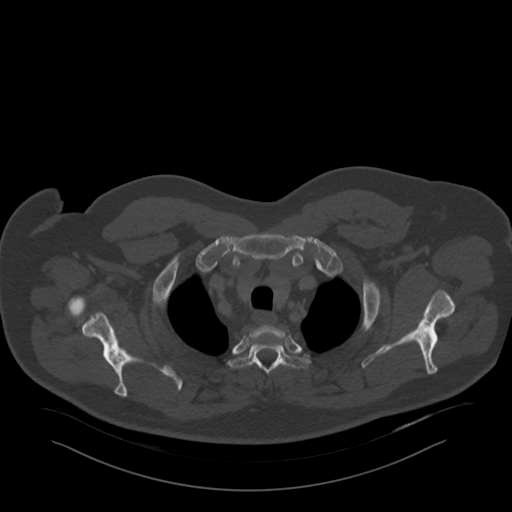

[Series 6: coronal soft tissue · coronal · 0.92mm/px · 3 of 110 slices shown]
[im 37/110  soft-tissue]
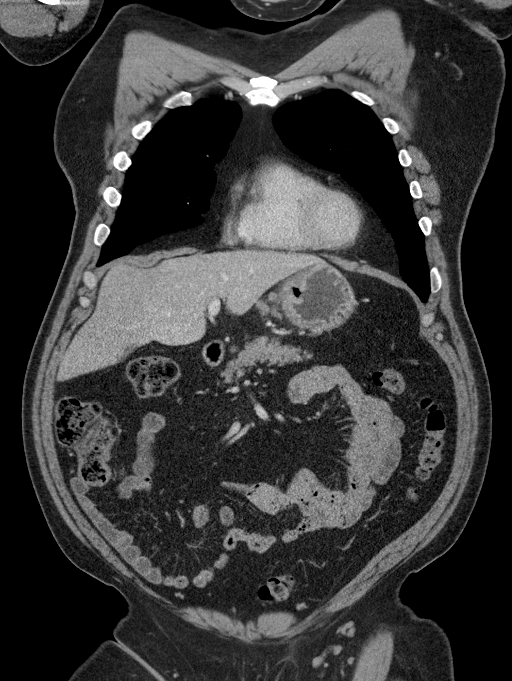
[im 49/110  soft-tissue]
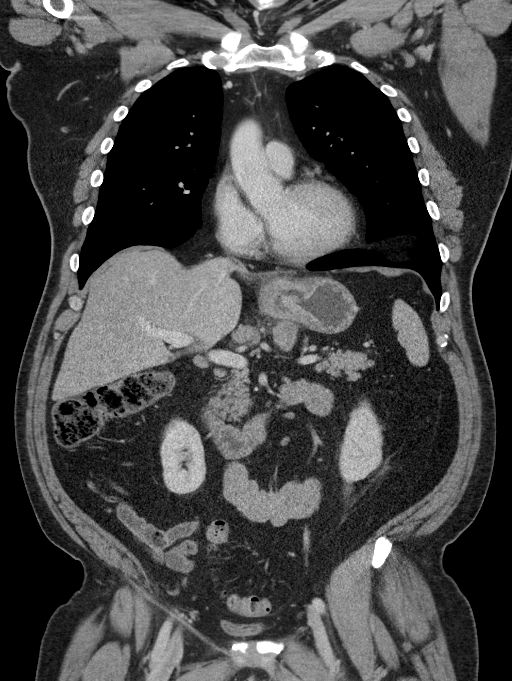
[im 61/110  soft-tissue]
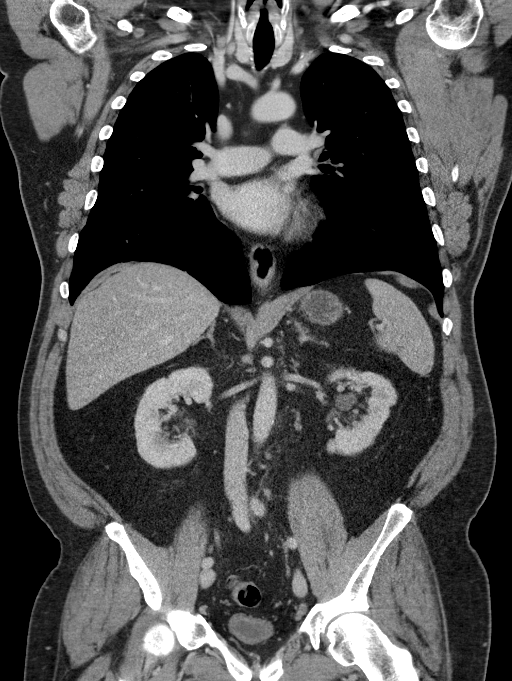

[12 of 46 positions shown; findings below may reference images not displayed]

FINDINGS: CT CHEST FINDINGS

Cardiovascular: Conventional 3 vessel arch anatomy. No evidence of
aortic aneurysm or dissection. The heart is normal in size. No
pericardial effusion. Unremarkable pulmonary artery is and venous
structures.

Mediastinum/Nodes: Positive for mediastinal adenopathy which was not
present on the remote prior study from 8774. Index nodes include a
right paratracheal lymph node measuring 1.3 cm in short axis (image
17 series 2) and a paraesophageal lymph node measuring 1.2 cm in
short axis (image 31 series 2) and additional para-aortic lymph node
is present in the posterior mediastinum an is enlarged at 1.6 cm
compared to 1.4 cm on the CT scan from 09/07/2016.

Lungs/Pleura: 4 mm nodular opacity in the right lower lobe (image
40, series 3) was likely present on the prior study from 8774
although the region was limited by motion artifact on the prior
study. Favor benign granuloma. Otherwise, the lungs are clear. No
pleural effusion.

Musculoskeletal: No acute fracture or aggressive appearing lytic or
blastic osseous lesion.

CT ABDOMEN PELVIS FINDINGS

Hepatobiliary: Stable circumscribed low-attenuation cystic lesion
measuring 9 mm in hepatic segment 2. Although too small for accurate
characterization, this is favored to represent a benign cyst.
Approximately 1.4 x 1.5 cm region of geographic hypoattenuation
along the medial aspect of hepatic segment 4A at the fissure for the
falciform ligament likely represents region of focal fatty
infiltration. Otherwise, no discrete hepatic lesions are identified.
The gallbladder is absent. No intra or extrahepatic biliary ductal
dilatation. The portal veins are patent.

Pancreas: Unremarkable. No pancreatic ductal dilatation or
surrounding inflammatory changes.

Spleen: Normal in size without focal abnormality.

Adrenals/Urinary Tract: Both adrenal glands are normal. No enhancing
renal mass in either kidney. Stable punctate stone in the lower pole
collecting system on the left. Stable minimally complex 2 cm cyst
exophytic from the lower pole of the left kidney. No evidence of
enhancement compared with the prior noncontrast study. This likely
represents a minimally complex cyst. However, there is a new 1.7 x
1.2 x 2.0 cm intermediate attenuation density which abuts the medial
upper pole cortex of the right kidney. This appears to be centered
within the perinephric fat and abutting the kidney rather than
emanating from the kidney was not clearly evident on the prior
study. Small parapelvic renal sinus cysts bilaterally. Unremarkable
ureters and bladder.

Stomach/Bowel: Large ulcerated soft tissue mass arising from the
gastroesophageal junction. This mass is difficult to measure by CT
imaging but measures approximately 7.9 x 5.2 cm on the axial images.
The remainder of the small bowel and colon demonstrate no additional
regions of focal wall thickening. There is no evidence of
obstruction. Normal appendix in the right lower quadrant.

Vascular/Lymphatic: No significant atherosclerotic plaque or
evidence of vascular aneurysm. Markedly abnormal retroperitoneal and
upper abdominal lymph nodes consistent with metastatic disease.
Small, but round and borderline enlarged nodes in the gastrohepatic
ligament. An index node measures 1.4 cm on image 60 of series 2.
This has enlarged compared to 0.9 cm previously. Celiac station
lymph nodes are also enlarged compared to prior. Index nodes measure
2.4 x 4.0 cm (image 62 series 2) compared to 1.9 x 3.5 cm and 3.3 x
2.4 cm (image 63) compared to 1.5 x by 1.2 cm respectively. Left
para-aortic retroperitoneal adenopathy remain similar to slightly
progressed. The 1.3 cm node is essentially unchanged in size while
an adjacent smaller noted has enlarged now measuring 1.0 cm compared
to 0.8 cm previously.

Reproductive: Prostate is unremarkable.

Other: No abdominal wall hernia or abnormality. No abdominopelvic
ascites.

Musculoskeletal: No acute fracture or aggressive appearing lytic or
blastic osseous lesion. Chronic bilateral L5 pars defects with mild
grade 1 anterolisthesis of L5 on S1. Focal degenerative disc disease
at L5-S1.
IMPRESSION: 1. Large, irregular ulcerated mass at the gastroesophageal junction
consistent with the clinical history of esophageal cancer. There is
evidence of a multifocal metastatic adenopathy involving the
mediastinum, retrocrural, gastrohepatic ligament, celiac and
retroperitoneal/para-aortic nodal stations. There has been a
significant increase in size of the lymph nodes identified on the
relatively recent prior scan from 09/07/2016 concerning for
progression of disease.
2. New 2 cm enhancing soft tissue density abutting the medial upper
pole of the right kidney. This appears to be centered within the
perinephric fat and abutting the kidney rather than emanating
exophytically from the kidney and is concerning for a metastatic
implant.
3. Nonspecific region of geographic hypoattenuation in the medial
aspect of hepatic segment 4A along the fissure for the falciform
ligament. Typically, this would be most consistent with a region of
focal fatty infiltration. While that is favored, in the setting of
malignancy, metastatic disease is difficult to exclude entirely.
Recommend attention on follow-up imaging. No other liver lesions are
identified at this time.
4. Minimally complex benign proteinaceous or hemorrhagic cyst
exophytic from the lower pole of the left kidney. No evidence of
enhancement on today's study.
5. Left lower pole nephrolithiasis.
6. Bilateral renal sinus cysts.
7. Chronic bilateral L5 pars defects with mild grade 1
anterolisthesis of L5 on S1 an associated focal degenerative disc
disease.
8. Stable 4 mm likely benign granuloma in the right lower lobe which
has been present back to Friday June, 2005. No additional pulmonary
nodules to suggest pulmonary metastatic disease.

## 2018-02-15 ENCOUNTER — Telehealth: Payer: Self-pay | Admitting: Cardiovascular Disease

## 2018-02-15 NOTE — Telephone Encounter (Signed)
Pt c/o medication issue:  1. Name of Medication: Verapamil   2. How are you currently taking this medication (dosage and times per day)? 240 mg po q day   3. Are you having a reaction (difficulty breathing--STAT)? no  4. What is your medication issue? Patient sent mychart msg .  Has concerns about hr trending in 50's and BP 95/60 Please call to discuss possible med dose changes

## 2018-02-16 ENCOUNTER — Other Ambulatory Visit: Payer: Self-pay

## 2018-02-16 ENCOUNTER — Other Ambulatory Visit: Payer: Self-pay | Admitting: Cardiovascular Disease

## 2018-02-16 NOTE — Telephone Encounter (Signed)
Pt called to report that he has been having a low BP every time he is at the Oncologist.. It has been 95/60 most recently but he does not have a list of the other readings. His HR has been staying around 50. He says he has been having increased fatigue but he denies dizziness, sob and lightheadedness. He reports that his HGB and RBC's have also been low but being managed by his Oncologist at [facility].  I have requested that he keeps track of his BP the next couple of days and agrees to Mychart them to [place].  I will also forward to Dr. Rockey Situ to see if he would like to go ahead any make any changes to his Verapamil if not will wait until we see the BP readings in a few days.

## 2018-02-16 NOTE — Telephone Encounter (Signed)
I suspect he may have lost weight, resulting in lower BP? Would monitor a few more days,  If BP trends back up as verapamil comes out of his system, >859C systolic Would consider decreasing dose down to 120 mg daily, from 240 daily

## 2018-02-16 NOTE — Progress Notes (Signed)
Error

## 2018-03-01 ENCOUNTER — Other Ambulatory Visit: Payer: Self-pay | Admitting: *Deleted

## 2018-03-01 MED ORDER — AMLODIPINE BESYLATE 5 MG PO TABS: 5.0000 mg | ORAL_TABLET | Freq: Every day | ORAL | 2 refills | Status: DC

## 2018-06-12 ENCOUNTER — Telehealth: Payer: Self-pay | Admitting: Family Medicine

## 2018-06-12 MED ORDER — OSELTAMIVIR PHOSPHATE 75 MG PO CAPS: 75.0000 mg | ORAL_CAPSULE | Freq: Every day | ORAL | 0 refills | Status: DC

## 2018-06-12 NOTE — Telephone Encounter (Signed)
Tamiflu sent to pharmacy. If he develops symptoms he needs to contact us right away. Please confirm that he got the flu vaccine as well. Thanks.

## 2018-06-12 NOTE — Telephone Encounter (Signed)
Patient will call back if symptoms develop, confirmed flu shot and added to chart.

## 2018-06-12 NOTE — Telephone Encounter (Signed)
Copied from [place] [id number]. Topic: General - Inquiry >> Jun 12, 2018 11:55 AM Margot Ables wrote: Reason for CRM: Pt states his daughter has the flu, sore throat, and fever. Pt is currently treated at [facility] for Stage 4 stomach cancer and is on immunotherapy and chemo. Pt was advised to contact PCP due to exposure and request Tamiflu. Please advise if Dr. Caryl Bis will call in for him.   [id number] [place] [address] [place] [address] Phone: [phone] Fax: [phone]

## 2018-07-18 NOTE — Telephone Encounter (Signed)
We need a new lipid panel before making a decision thx TG

## 2018-07-20 ENCOUNTER — Other Ambulatory Visit: Payer: Self-pay | Admitting: *Deleted

## 2018-07-20 DIAGNOSIS — E782 Mixed hyperlipidemia: Secondary | ICD-10-CM

## 2018-07-26 ENCOUNTER — Other Ambulatory Visit: Payer: BLUE CROSS/BLUE SHIELD

## 2018-08-11 ENCOUNTER — Telehealth: Payer: Self-pay | Admitting: Cardiovascular Disease

## 2018-08-11 NOTE — Telephone Encounter (Signed)
Patient currently scheduled for labs next week to be changed to [facility].    Patient concerned about covid exposure and his risk factors. Is it ok to postpone or cancel this lipid for now?  Please advise or call patient

## 2018-08-11 NOTE — Telephone Encounter (Signed)
Yes it should be fine to postpone the lab work. It appears he's due for his 1 year follow up. It would be good to offer a virtual visit to patient to meet with Dr Rockey Situ as well while you have him on the line. If you run into any issues, let us know.

## 2018-08-17 ENCOUNTER — Other Ambulatory Visit: Payer: BLUE CROSS/BLUE SHIELD

## 2018-09-14 ENCOUNTER — Telehealth: Payer: Self-pay

## 2018-09-14 DIAGNOSIS — E782 Mixed hyperlipidemia: Secondary | ICD-10-CM

## 2018-09-14 NOTE — Telephone Encounter (Signed)
Called patient to review consent. Upon reading the consent to patient he asked if we were going to get the labs done that we had pushed back back of the COVID19 virus. He said if we are not going to get the labs then he does not see a reason in keeping this appointment because he has no complaints.  He stated he was getting another call and had to go.  He stated he would call back at a later date.

## 2018-09-19 NOTE — Progress Notes (Deleted)
{Choose 1 Note Type (Telehealth Visit or Telephone Visit):[phone]}   I connected with  Jose Hayes on 09/19/18 by a video enabled telemedicine application and verified that I am speaking with the correct person using two identifiers. I discussed the limitations of evaluation and management by telemedicine. The patient expressed understanding and agreed to proceed.   Evaluation Performed:  Follow-up visit  Date:  09/19/2018   ID:  Jose Hayes, DOB 29-Dec-1960, MRN [id number]  Patient Location:  [address] [place]  [address]   Provider location:   [facility]  PCP:  Leone Haven, MD  Cardiologist:  Patsy Baltimore   Chief Complaint:      History of Present Illness:    Jose Hayes is a 58 y.o. male who presents via audio/video conferencing for a telehealth visit today.   The patient does not symptoms concerning for COVID-19 infection (fever, chills, cough, or new SHORTNESS OF BREATH).   Patient has a past medical history of obesity,  smoking history who smokes one half pack per day [facility] 11/05/2011 with SVT. Rhythm broke to normal sinus rhythm in the emergency room. Adenosine may have worked He presents for routine followup of his arrhythmia  Difficult year Details discussed with him, diagnosed with stomach cancer Completed chemotherapy through [facility] clinical trial Developed Adrenal problem and thyrod issue  Currently feels relatively well Weight initially dropped now climbed back up Not very active at baseline Has peripheral neuropathy Swelling in his toes and feet  Recent imaging studies reviewed with him 08/18/17 CT CAP stable nonspecific pulmonary nodules, no evidence of metastatic disease in abdomen or pelvis. Previously seen hepatic and nodal metastases are no longer visualized  Denies any tachycardia Continues to work in Engineer, technical sales at [employer].  EKG personally reviewed by myself on todays visit Shows normal  sinus rhythm rate 61 bpm no significant ST or T-wave changes  other past medical history  Echocardiogram was essentially normal with normal ejection fraction, normal biventricular systolic pressures.  Total cholesterol 160. Tolerating Lipitor 20 mg daily Reports having blood work done through his doctor at work     Prior CV studies:   The following studies were reviewed today:    Past Medical History:  Diagnosis Date  . Allergy   . Esophageal cancer ([place]) 10/21/2016   GE junction  . GERD (gastroesophageal reflux disease)   . Hyperglycemia   . Hyperlipidemia   . Hypertension   . SVT (supraventricular tachycardia) ([place])    2013  . Tobacco abuse    Past Surgical History:  Procedure Laterality Date  . COLONOSCOPY    . LAPAROSCOPIC CHOLECYSTECTOMY  1991  . SHOULDER SURGERY Right 1985, 1991, 2000   left x3  . [place] teeth extraction    . UPPER GASTROINTESTINAL ENDOSCOPY       No outpatient medications have been marked as taking for the 09/20/18 encounter (Appointment) with Minna Merritts, MD.   Current Facility-Administered Medications for the 09/20/18 encounter (Appointment) with Minna Merritts, MD  Medication  . 0.9 %  sodium chloride infusion     Allergies:   Patient has no known allergies.   Social History   Tobacco Use  . Smoking status: Former Smoker    Packs/day: 0.50    Years: 25.00    Pack years: 12.50    Types: Cigarettes  . Smokeless tobacco: Never Used  . Tobacco comment: trying gum to quit  Substance Use Topics  . Alcohol use:  Yes    Comment: occasionally  . Drug use: No     Current Outpatient Medications on File Prior to Visit  Medication Sig Dispense Refill  . acetaminophen (TYLENOL) 500 MG tablet Take by mouth as needed.     Marland Kitchen amLODipine (NORVASC) 5 MG tablet Take 1 tablet (5 mg total) by mouth daily. 90 tablet 2  . capecitabine (XELODA) 500 MG tablet Take 500 mg by mouth.    . dexamethasone (DECADRON) 4 MG tablet Take 4 mg by  mouth as needed.     . furosemide (LASIX) 20 MG tablet Take 1 tablet (20 mg) by mouth once daily as needed for swelling/ increased weight/ or shortness of breath 30 tablet 5  . gabapentin (NEURONTIN) 100 MG capsule Take 100 mg by mouth 2 (two) times daily.    Marland Kitchen HYDROcodone-acetaminophen (NORCO/VICODIN) 5-325 MG tablet Take by mouth.    . hydrocortisone (CORTEF) 10 MG tablet TAKE 1.5 TABS BY MOUTH EVERY MORNING THEN 1 TABLET IN THE EVENING  3  . levothyroxine (SYNTHROID, LEVOTHROID) 75 MCG tablet Take 75 mcg by mouth daily before breakfast.     . OLANZapine (ZYPREXA) 5 MG tablet Take 5 mg by mouth as needed.     . ondansetron (ZOFRAN) 8 MG tablet Take 1 tablet (8 mg) by mouth every 12 hours 30 minutes prior to each dose of oral chemotherapy to prevent nausea.    Marland Kitchen oseltamivir (TAMIFLU) 75 MG capsule Take 1 capsule (75 mg total) by mouth daily. 10 capsule 0  . pembrolizumab (KEYTRUDA) 100 MG/4ML SOLN Inject into the vein.    Marland Kitchen traMADol (ULTRAM) 50 MG tablet Take 1 tablet (50 mg total) by mouth 3 (three) times daily as needed. 60 tablet 1  . verapamil (CALAN) 40 MG tablet Take 1 tablet (40 mg total) by mouth as needed. 90 tablet 0  . verapamil (CALAN-SR) 240 MG CR tablet Take 1 tablet (240 mg total) by mouth at bedtime. 90 tablet 3  . zolpidem (AMBIEN CR) 6.25 MG CR tablet Take 6.25 mg by mouth as needed.     Current Facility-Administered Medications on File Prior to Visit  Medication Dose Route Frequency Provider Last Rate Last Dose  . 0.9 %  sodium chloride infusion  500 mL Intravenous Continuous Ladene Artist, MD         Family Hx: The patient's family history includes Clotting disorder in his father; Hypertension in his father and mother; Pancreatic cancer in his father. There is no history of Colon cancer, Esophageal cancer, Prostate cancer, Rectal cancer, or Stomach cancer.  ROS:   Please see the history of present illness.    ROS    Labs/Other Tests and Data Reviewed:    Recent  Labs: No results found for requested labs within last 8760 hours.   Recent Lipid Panel Lab Results  Component Value Date/Time   CHOL 191 11/06/2011 04:27 AM   TRIG 107 11/06/2011 04:27 AM   HDL 41 11/06/2011 04:27 AM   LDLCALC 129 (H) 11/06/2011 04:27 AM    Wt Readings from Last 3 Encounters:  09/20/17 262 lb 8 oz (119.1 kg)  12/22/16 232 lb 3.2 oz (105.3 kg)  11/04/16 241 lb 12.8 oz (109.7 kg)     Exam:    Vital Signs: Vital signs may also be detailed in the HPI There were no vitals taken for this visit.  Wt Readings from Last 3 Encounters:  09/20/17 262 lb 8 oz (119.1 kg)  12/22/16 232 lb 3.2  oz (105.3 kg)  11/04/16 241 lb 12.8 oz (109.7 kg)   Temp Readings from Last 3 Encounters:  12/22/16 97.8 F (36.6 C) (Oral)  11/04/16 99 F (37.2 C) (Oral)  11/02/16 99.3 F (37.4 C) (Oral)   BP Readings from Last 3 Encounters:  09/20/17 (!) 120/98  12/22/16 120/66  11/04/16 128/79   Pulse Readings from Last 3 Encounters:  09/20/17 61  12/22/16 83  11/04/16 96     Well nourished, well developed male in no acute distress. Constitutional:  oriented to person, place, and time. No distress.  Head: Normocephalic and atraumatic.  Eyes:  no discharge. No scleral icterus.  Neck: Normal range of motion. Neck supple.  Pulmonary/Chest: No audible wheezing, no distress, appears comfortable Musculoskeletal: Normal range of motion.  no  tenderness or deformity.  Neurological:   Coordination normal. Full exam not performed Skin:  No rash Psychiatric:  normal mood and affect. behavior is normal. Thought content normal.    ASSESSMENT & PLAN:    No diagnosis found.   COVID-19 Education: The signs and symptoms of COVID-19 were discussed with the patient and how to seek care for testing (follow up with PCP or arrange E-visit).  The importance of social distancing was discussed today.  Patient Risk:   After full review of this patients clinical status, I feel that they are at  least moderate risk at this time.  Time:   Today, I have spent 25 minutes with the patient with telehealth technology discussing the cardiac and medical problems/diagnoses detailed above   10 min spent reviewing the chart prior to patient visit today   Medication Adjustments/Labs and Tests Ordered: Current medicines are reviewed at length with the patient today.  Concerns regarding medicines are outlined above.   Tests Ordered: No tests ordered   Medication Changes: No changes made   Disposition: Follow-up in 6 months   Signed, Ida Rogue, MD  09/19/2018 3:32 PM    [place] [facility] [address], [place], [place] [address]

## 2018-09-20 ENCOUNTER — Telehealth: Payer: BLUE CROSS/BLUE SHIELD | Admitting: Cardiovascular Disease

## 2018-09-20 ENCOUNTER — Other Ambulatory Visit: Payer: Self-pay

## 2018-09-20 NOTE — Telephone Encounter (Signed)
Called patient  No answer  VM is full

## 2018-09-20 NOTE — Telephone Encounter (Signed)
Dr. Rockey Hayes- the patient appears to not want an e-visit prior to his lipid panel being done. In light of COVID- do you want [place] to tell him to wait a little longer on the lab and push out his follow up appt until after they are done?   Thoughts??

## 2018-09-21 NOTE — Telephone Encounter (Signed)
Are the lab orders in for this patient to go to [facility]?

## 2018-09-21 NOTE — Telephone Encounter (Signed)
Spoke with patient and reviewed lab order entered for him to have done at [facility] of the hospital. He mentioned that he did have labs recently drawn and will also go back in 2 weeks as well to get them done at [facility]. He is going to check with them to see if they could add it on to their labs and if not then he will go to [facility] and then call us once he is ready to schedule follow up appointment. He was very appreciative for the call with no further questions at this time.

## 2018-09-21 NOTE — Addendum Note (Signed)
Addended by: Valora Corporal on: 09/21/2018 02:00 PM   Modules accepted: Orders

## 2018-10-09 ENCOUNTER — Telehealth: Payer: Self-pay | Admitting: Cardiovascular Disease

## 2018-10-09 NOTE — Telephone Encounter (Signed)
Patient wanted to let us know he had his lipid panel drawn at [facility] and should be available for DR. Gollan's review.

## 2018-10-10 NOTE — Telephone Encounter (Signed)
This was recently updated in [place]:  LIPID PROFILE Component Name 10/05/2018   [id number]  [id number]  [id number]  [id number]

## 2018-10-10 NOTE — Telephone Encounter (Signed)
I looked through all of the lab work there is no lipid panel There was order placed through our office to have it checked if he wants

## 2018-10-12 NOTE — Telephone Encounter (Signed)
Labs reviewed, looks ok

## 2018-10-13 NOTE — Telephone Encounter (Signed)
Pt aware of Dr Gwenyth Ober review .Jose Hayes

## 2018-11-29 ENCOUNTER — Encounter: Payer: Self-pay | Admitting: Gastroenterology

## 2018-11-30 ENCOUNTER — Other Ambulatory Visit: Payer: Self-pay

## 2018-11-30 MED ORDER — AMLODIPINE BESYLATE 5 MG PO TABS: 5.0000 mg | ORAL_TABLET | Freq: Every day | ORAL | 0 refills | Status: DC

## 2018-11-30 NOTE — Telephone Encounter (Signed)
Requested Prescriptions   Signed Prescriptions Disp Refills  . amLODipine (NORVASC) 5 MG tablet 30 tablet 0    Sig: Take 1 tablet (5 mg total) by mouth daily.    Authorizing Provider: Minna Merritts    Ordering User: Janan Ridge

## 2019-01-04 ENCOUNTER — Other Ambulatory Visit: Payer: Self-pay | Admitting: Cardiovascular Disease

## 2019-01-04 MED ORDER — AMLODIPINE BESYLATE 5 MG PO TABS: 5.0000 mg | ORAL_TABLET | Freq: Every day | ORAL | 0 refills | Status: DC

## 2019-01-04 NOTE — Telephone Encounter (Signed)
Requested Prescriptions   Signed Prescriptions Disp Refills  . amLODipine (NORVASC) 5 MG tablet 90 tablet 0    Sig: Take 1 tablet (5 mg total) by mouth daily.    Authorizing Provider: Minna Merritts    Ordering User: Raelene Bott, Leesa Leifheit L   Please advise on when Dr. Rockey Situ would like to have patient F/U. May F/U was canceled since he was not having any symptoms. Thank you!

## 2019-01-04 NOTE — Telephone Encounter (Signed)
Refill already sent in today. I do not see where he had an e-visit with Dr. Rockey Situ in May. He will need to call to schedule.  There was a comment added to his RX that was sent in to the pharmacy as well that he needs to call to schedule.

## 2019-01-04 NOTE — Telephone Encounter (Signed)
°*  STAT* If patient is at the pharmacy, call can be transferred to refill team.   1. Which medications need to be refilled? (please list name of each medication and dose if known) amlodipine 5 MG 1 daily   2. Which pharmacy/location (including street and city if local pharmacy) is medication to be sent to? [facility] [address]   3. Do they need a 30 day or 90 day supply? 90 day   Patient refuses to schedule an appointment, patient down to 1 left.  States that he spoke virtually with Dr Rockey Situ in May and that he wanted him to have labs.  Patient had labs and was informed through results call that labs were good, patient feels this was his appointment.  Please advise.

## 2019-01-04 NOTE — Addendum Note (Signed)
Addended by: Raelene Bott, Margeaux Swantek L on: 01/04/2019 02:29 PM   Modules accepted: Orders

## 2019-01-08 ENCOUNTER — Encounter: Payer: Self-pay | Admitting: Family Medicine

## 2019-01-09 ENCOUNTER — Ambulatory Visit (INDEPENDENT_AMBULATORY_CARE_PROVIDER_SITE_OTHER): Payer: BC Managed Care – PPO | Admitting: Family Medicine

## 2019-01-09 ENCOUNTER — Other Ambulatory Visit: Payer: Self-pay

## 2019-01-09 ENCOUNTER — Encounter: Payer: Self-pay | Admitting: Family Medicine

## 2019-01-09 DIAGNOSIS — R05 Cough: Secondary | ICD-10-CM | POA: Diagnosis not present

## 2019-01-09 DIAGNOSIS — R0989 Other specified symptoms and signs involving the circulatory and respiratory systems: Secondary | ICD-10-CM | POA: Diagnosis not present

## 2019-01-09 DIAGNOSIS — Z20828 Contact with and (suspected) exposure to other viral communicable diseases: Secondary | ICD-10-CM

## 2019-01-09 DIAGNOSIS — R059 Cough, unspecified: Secondary | ICD-10-CM

## 2019-01-09 DIAGNOSIS — Z20822 Contact with and (suspected) exposure to covid-19: Secondary | ICD-10-CM

## 2019-01-09 MED ORDER — ALBUTEROL SULFATE HFA 108 (90 BASE) MCG/ACT IN AERS: 2.0000 | INHALATION_SPRAY | Freq: Four times a day (QID) | RESPIRATORY_TRACT | 0 refills | Status: DC | PRN

## 2019-01-09 NOTE — Progress Notes (Signed)
Patient ID: Jose Hayes, male   DOB: 1960/08/11, 58 y.o.   MRN: [id number]    Virtual Visit via video Note  This visit type was conducted due to national recommendations for restrictions regarding the COVID-19 pandemic (e.g. social distancing).  This format is felt to be most appropriate for this patient at this time.  All issues noted in this document were discussed and addressed.  No physical exam was performed (except for noted visual exam findings with Video Visits).   I connected with Jose Hayes today at  1:40 PM EDT by a video enabled telemedicine application and verified that I am speaking with the correct person using two identifiers. Location patient: home Location provider: work or home office Persons participating in the virtual visit: patient, provider  I discussed the limitations, risks, security and privacy concerns of performing an evaluation and management service by telephone and the availability of in person appointments. I also discussed with the patient that there may be a patient responsible charge related to this service. The patient expressed understanding and agreed to proceed.  HPI:  Patient and I connected via video due to complaint of cough, chest congestion and some upper back pain.  Patient states he has had some cough and congestion with upper back pain off and on for the past week or so.  Patient is currently completing a cancer/chemotherapy research drug treatment program through [facility], and was advised by the medical staff there to reach out to his PCP in regards to his symptoms of cough and chest congestion.  Patient states he does have adrenal insufficiency related to side effects from the chemotherapy drug, he is on chronic prednisone and he does have a sick day protocol of prednisone increase whenever he develops any sort of respiratory/sick symptoms.  States that since increasing the prednisone, cough and chest congestion seem  improved.  Does cough up some tan phlegm at times but usually is more of a dry cough.  Denies feeling like he cannot catch his breath or hearing audible wheezing.  Denies fever or chills.  Has chronic fatigue related to his chemotherapy treatments, energy level seems to be at his baseline.   ROS: See pertinent positives and negatives per HPI.  Past Medical History:  Diagnosis Date   Allergy    Esophageal cancer ([place]) 10/21/2016   GE junction   GERD (gastroesophageal reflux disease)    Hyperglycemia    Hyperlipidemia    Hypertension    SVT (supraventricular tachycardia) ([place])    2013   Tobacco abuse     Past Surgical History:  Procedure Laterality Date   COLONOSCOPY     LAPAROSCOPIC CHOLECYSTECTOMY  1991   SHOULDER SURGERY Right 1985, 1991, 2000   left x3   [place] teeth extraction     UPPER GASTROINTESTINAL ENDOSCOPY      Family History  Problem Relation Age of Onset   Hypertension Mother    Hypertension Father    Pancreatic cancer Father    Clotting disorder Father        renal cell ca   Colon cancer Neg Hx    Esophageal cancer Neg Hx    Prostate cancer Neg Hx    Rectal cancer Neg Hx    Stomach cancer Neg Hx     Social History   Tobacco Use   Smoking status: Former Smoker    Packs/day: 0.50    Years: 25.00    Pack years: 12.50    Types: Cigarettes  Smokeless tobacco: Never Used   Tobacco comment: trying gum to quit  Substance Use Topics   Alcohol use: Yes    Comment: occasionally    Current Outpatient Medications:    acetaminophen (TYLENOL) 500 MG tablet, Take by mouth as needed. , Disp: , Rfl:    amLODipine (NORVASC) 5 MG tablet, Take 1 tablet (5 mg total) by mouth daily., Disp: 90 tablet, Rfl: 0   capecitabine (XELODA) 500 MG tablet, Take 500 mg by mouth., Disp: , Rfl:    dexamethasone (DECADRON) 4 MG tablet, Take 4 mg by mouth as needed. , Disp: , Rfl:    furosemide (LASIX) 20 MG tablet, Take 1 tablet (20 mg) by mouth  once daily as needed for swelling/ increased weight/ or shortness of breath, Disp: 30 tablet, Rfl: 5   gabapentin (NEURONTIN) 100 MG capsule, Take 100 mg by mouth 2 (two) times daily., Disp: , Rfl:    HYDROcodone-acetaminophen (NORCO/VICODIN) 5-325 MG tablet, Take by mouth., Disp: , Rfl:    hydrocortisone (CORTEF) 10 MG tablet, TAKE 1.5 TABS BY MOUTH EVERY MORNING THEN 1 TABLET IN THE EVENING, Disp: , Rfl: 3   OLANZapine (ZYPREXA) 5 MG tablet, Take 5 mg by mouth as needed. , Disp: , Rfl:    ondansetron (ZOFRAN) 8 MG tablet, Take 1 tablet (8 mg) by mouth every 12 hours 30 minutes prior to each dose of oral chemotherapy to prevent nausea., Disp: , Rfl:    oseltamivir (TAMIFLU) 75 MG capsule, Take 1 capsule (75 mg total) by mouth daily., Disp: 10 capsule, Rfl: 0   pembrolizumab (KEYTRUDA) 100 MG/4ML SOLN, Inject into the vein., Disp: , Rfl:    traMADol (ULTRAM) 50 MG tablet, Take 1 tablet (50 mg total) by mouth 3 (three) times daily as needed., Disp: 60 tablet, Rfl: 1   verapamil (CALAN) 40 MG tablet, Take 1 tablet (40 mg total) by mouth as needed., Disp: 90 tablet, Rfl: 0   verapamil (CALAN-SR) 240 MG CR tablet, Take 1 tablet (240 mg total) by mouth at bedtime., Disp: 90 tablet, Rfl: 3   levothyroxine (SYNTHROID, LEVOTHROID) 75 MCG tablet, Take 75 mcg by mouth daily before breakfast. , Disp: , Rfl:    zolpidem (AMBIEN CR) 6.25 MG CR tablet, Take 6.25 mg by mouth as needed., Disp: , Rfl:   Current Facility-Administered Medications:    0.9 %  sodium chloride infusion, 500 mL, Intravenous, Continuous, Ladene Artist, MD  EXAM:  GENERAL: alert, oriented, appears well and in no acute distress  HEENT: atraumatic, conjunttiva clear, no obvious abnormalities on inspection of external nose and ears  NECK: normal movements of the head and neck  LUNGS: on inspection no signs of respiratory distress, breathing rate appears normal, no obvious gross SOB, gasping or wheezing. Coughs a few  times while we are on video call, is not harsh cough.   CV: no obvious cyanosis  MS: moves all visible extremities without noticeable abnormality  PSYCH/NEURO: pleasant and cooperative, no obvious depression or anxiety, speech and thought processing grossly intact  ASSESSMENT AND PLAN:  Discussed the following assessment and plan:  Cough/chest congestion-patient will continue increased dose of steroid per his sick day protocol prescribed by Doctors who treat him for his cancer/manage his chemotherapy drugs.  We will get patient set up for COVID-19 testing, advised taking go to either [facility] drive-through testing center or can reach back out to the staff at [facility] to be set up for testing there.  Advised that the testing  at [facility] is a send out and results will take anywhere from 2 to 7 days to come back.  Patient plans to reach out to staff at [facility] to see if they would prefer he do a rapid testing for facility or use our testing facility.  Patient will be diligent with his handwashing, get plenty of rest.  I will prescribe albuterol inhaler to have on hand if needed in times of shortness of breath or wheezing.  Advised he can use Mucinex tablets to help break up cough/chest congestion.  Patient does feel that the prednisone has already done a good job of improving his symptoms.  He will call office right away if anything worsens or if new symptoms develop.   I discussed the assessment and treatment plan with the patient. The patient was provided an opportunity to ask questions and all were answered. The patient agreed with the plan and demonstrated an understanding of the instructions.   The patient was advised to call back or seek an in-person evaluation if the symptoms worsen or if the condition fails to improve as anticipated.   Jodelle Green, FNP

## 2019-01-11 LAB — NOVEL CORONAVIRUS, NAA: SARS-CoV-2, NAA: NOT DETECTED

## 2019-04-03 ENCOUNTER — Other Ambulatory Visit: Payer: Self-pay

## 2019-04-04 MED ORDER — AMLODIPINE BESYLATE 5 MG PO TABS: 5.0000 mg | ORAL_TABLET | Freq: Every day | ORAL | 0 refills | Status: DC

## 2019-04-04 NOTE — Telephone Encounter (Signed)
Scheduled

## 2019-04-04 NOTE — Telephone Encounter (Signed)
Spoke with patient and advised that he would need to make appointment with provider either virtual or in-person for any future refills. Advised that I would send in 30 days and then have scheduling call to set up appointment. He verbalized understanding with no further questions at this time.

## 2019-04-08 NOTE — Progress Notes (Signed)
Cardiology Office Note  Date:  04/09/2019   ID:  Jose Hayes, DOB [birth year], MRN [id number]  PCP:  Jose Haven, MD   Chief Complaint  Patient presents with  . Other    12 month follow up. Patient denies chest pain and SOB at this time. Meds reviewed verbally with patient.     HPI:  Jose Hayes is a very pleasant 58 year-old gentleman with  obesity,  smoking history who smokes one half pack per day [facility] 11/05/2011 with SVT Rhythm broke to normal sinus rhythm in the emergency room. Adenosine may have worked Stomach cancer, completed chemotherapy, developed adrenal and thyroid issues He presents for routine followup of his arrhythmia/SVT   stomach cancer Completed chemotherapy through [facility] clinical trial Developed Adrenal problem and thyrod issue  Doing well in follow up Finished treatment, in remission Days with fatigue On prednisone 6 mg daily,   Denies any tachycardia Retired from  Engineer, technical sales at [employer]. On long term disability No SOB, some with "big hills"  Ruptured biceps, Non surgical  EKG personally reviewed by myself on todays visit Shows normal sinus rhythm rate 77 bpm no significant ST or T-wave changes   other past medical history  Echocardiogram was essentially normal with normal ejection fraction, normal biventricular systolic pressures.   PMH:   has a past medical history of Allergy, Esophageal cancer ([place]) (10/21/2016), GERD (gastroesophageal reflux disease), Hyperglycemia, Hyperlipidemia, Hypertension, SVT (supraventricular tachycardia) ([place]), and Tobacco abuse.  PSH:    Past Surgical History:  Procedure Laterality Date  . COLONOSCOPY    . LAPAROSCOPIC CHOLECYSTECTOMY  1991  . SHOULDER SURGERY Right 1985, 1991, 2000   left x3  . swidom teeth extraction    . UPPER GASTROINTESTINAL ENDOSCOPY      Current Outpatient Medications  Medication Sig Dispense Refill  . acetaminophen (TYLENOL) 500 MG tablet Take by mouth as needed.     Marland Kitchen  albuterol (VENTOLIN HFA) 108 (90 Base) MCG/ACT inhaler Inhale 2 puffs into the lungs every 6 (six) hours as needed for wheezing or shortness of breath. 18 g 0  . ALPRAZolam (XANAX) 1 MG tablet Take 1 mg by mouth 3 (three) times daily as needed for anxiety.    Marland Kitchen amLODipine (NORVASC) 5 MG tablet Take 1 tablet (5 mg total) by mouth daily. 30 tablet 0  . dexamethasone (DECADRON) 4 MG tablet Take 4 mg by mouth as needed.     . furosemide (LASIX) 20 MG tablet Take 1 tablet (20 mg) by mouth once daily as needed for swelling/ increased weight/ or shortness of breath 30 tablet 5  . HYDROcodone-acetaminophen (NORCO/VICODIN) 5-325 MG tablet Take by mouth.    . ondansetron (ZOFRAN) 8 MG tablet Take 1 tablet (8 mg) by mouth every 12 hours 30 minutes prior to each dose of oral chemotherapy to prevent nausea.    . predniSONE (DELTASONE) 1 MG tablet Take 1 mg by mouth daily.    . predniSONE (DELTASONE) 5 MG tablet Take 5 mg by mouth daily.    . traMADol (ULTRAM) 50 MG tablet Take 1 tablet (50 mg total) by mouth 3 (three) times daily as needed. 60 tablet 1  . verapamil (CALAN) 40 MG tablet Take 1 tablet (40 mg total) by mouth as needed. 90 tablet 0  . levothyroxine (SYNTHROID, LEVOTHROID) 75 MCG tablet Take 75 mcg by mouth daily before breakfast.     . zolpidem (AMBIEN CR) 6.25 MG CR tablet Take 6.25 mg by mouth as needed.  Current Facility-Administered Medications  Medication Dose Route Frequency Provider Last Rate Last Dose  . 0.9 %  sodium chloride infusion  500 mL Intravenous Continuous Ladene Artist, MD         Allergies:   Patient has no known allergies.   Social History:  The patient  reports that he has quit smoking. His smoking use included cigarettes. He has a 12.50 pack-year smoking history. He has never used smokeless tobacco. He reports current alcohol use. He reports that he does not use drugs.   Family History:   family history includes Clotting disorder in his father; Hypertension in his  father and mother; Pancreatic cancer in his father.    Review of Systems: Review of Systems  Constitutional: Negative.   HENT: Negative.   Respiratory: Negative.   Cardiovascular: Negative.   Gastrointestinal: Negative.   Musculoskeletal: Negative.   Neurological: Negative.   Psychiatric/Behavioral: Negative.   All other systems reviewed and are negative.   PHYSICAL EXAM: VS:  BP 128/78 (BP Location: Left Arm, Patient Position: Sitting, Cuff Size: Normal)   Pulse 77   Ht 5\' 8"  (1.727 m)   Wt 249 lb 12 oz (113.3 kg)   BMI 37.97 kg/m  , BMI Body mass index is 37.97 kg/m. Constitutional:  oriented to person, place, and time. No distress.  HENT:  Head: Grossly normal Eyes:  no discharge. No scleral icterus.  Neck: No JVD, no carotid bruits  Cardiovascular: Regular rate and rhythm, no murmurs appreciated Pulmonary/Chest: Clear to auscultation bilaterally, no wheezes or rails Abdominal: Soft.  no distension.  no tenderness.  Musculoskeletal: Normal range of motion Neurological:  normal muscle tone. Coordination normal. No atrophy Skin: Skin warm and dry Psychiatric: normal affect, pleasant   Recent Labs: No results found for requested labs within last 8760 hours.    Lipid Panel Lab Results  Component Value Date   CHOL 191 11/06/2011   HDL 41 11/06/2011   LDLCALC 129 (H) 11/06/2011   TRIG 107 11/06/2011      Wt Readings from Last 3 Encounters:  04/09/19 249 lb 12 oz (113.3 kg)  09/20/17 262 lb 8 oz (119.1 kg)  12/22/16 232 lb 3.2 oz (105.3 kg)      ASSESSMENT AND PLAN:  SVT (supraventricular tachycardia) (HCC) Denies having any recent tachycardia episodes No medication changes made  Mixed hyperlipidemia CT pulled up today from carotids to the iliac arteries with minimal if any atherosclerosis noted He does not want cholesterol medication at this time  Stomach cancer Treated at [facility], clinical trial Completed his treatment, now in remission  Essential  hypertension No medication changes made, numbers stable  Smoking We have encouraged him to continue to work on weaning his cigarettes and smoking cessation. He will continue to work on this and does not want any assistance with chantix.     Total encounter time more than 25 minutes  Greater than 50% was spent in counseling and coordination of care with the patient   Disposition:   F/U  12 months   No orders of the defined types were placed in this encounter.    Signed, Esmond Plants, M.D., Ph.D. 04/09/2019  [place], [place]

## 2019-04-09 ENCOUNTER — Other Ambulatory Visit: Payer: Self-pay

## 2019-04-09 ENCOUNTER — Encounter: Payer: Self-pay | Admitting: Cardiovascular Disease

## 2019-04-09 ENCOUNTER — Ambulatory Visit (INDEPENDENT_AMBULATORY_CARE_PROVIDER_SITE_OTHER): Payer: BC Managed Care – PPO | Admitting: Cardiovascular Disease

## 2019-04-09 VITALS — BP 128/78 | HR 77 | Ht 68.0 in | Wt 249.8 lb

## 2019-04-09 DIAGNOSIS — I10 Essential (primary) hypertension: Secondary | ICD-10-CM | POA: Diagnosis not present

## 2019-04-09 DIAGNOSIS — I471 Supraventricular tachycardia: Secondary | ICD-10-CM

## 2019-04-09 DIAGNOSIS — E782 Mixed hyperlipidemia: Secondary | ICD-10-CM

## 2019-04-09 MED ORDER — AMLODIPINE BESYLATE 5 MG PO TABS: 5.0000 mg | ORAL_TABLET | Freq: Every day | ORAL | 3 refills | Status: DC

## 2019-04-09 NOTE — Patient Instructions (Signed)

## 2019-04-19 ENCOUNTER — Telehealth: Payer: Self-pay

## 2019-04-19 NOTE — Telephone Encounter (Signed)
Noted. If he develops symptoms he needs to let us know.

## 2019-04-19 NOTE — Telephone Encounter (Signed)
I called and spoke with the patient and he stated that his wife went  to [facility] last week and was exposed her symptoms started Saturday but when she got home she was immediately quarantined upstairs in the home. She was tested on Tuesday.  The patient stated  he just went into remission for Stomach Cancer and he went to be tested today but the line was 3 hours long.  He will try again later today or tomorrow but he is not having any symptoms and has not been around his wife since she returned.  I gave him other sited for him to come and get tested as well. Jenia Klepper,cma

## 2019-04-19 NOTE — Telephone Encounter (Signed)
Please find out when her symptoms started and when she was tested. Please find out if the patient has any symptoms. If he has symptoms he can go to be tested today or tomorrow at the drive through testing at [facility]. He would need to wear a mask. If he does not have symptoms he will need to wait 5-7 days after his wifes symptoms start before he can be tested. He needs to strictly quarantine at home without leaving the home unless he needs to seek medical attention and they should not have any visitors. The quarantine will last at least until his result returns and may last longer if he has symptoms. He does not need an appointment when he goes for testing.

## 2019-04-19 NOTE — Telephone Encounter (Signed)
Copied from [place] [id number]. Topic: General - Call Back - No Documentation >> Apr 19, 2019  1:17 PM Erick Blinks wrote: Reason for CRM: Pt's wife has tested positive for covid. She would like for pt to receive orders/appt for Covid testing if possible. Please advise

## 2019-04-20 ENCOUNTER — Other Ambulatory Visit: Payer: Self-pay

## 2019-04-20 DIAGNOSIS — Z20822 Contact with and (suspected) exposure to covid-19: Secondary | ICD-10-CM

## 2019-04-22 LAB — NOVEL CORONAVIRUS, NAA: SARS-CoV-2, NAA: NOT DETECTED

## 2019-04-26 ENCOUNTER — Ambulatory Visit: Payer: BC Managed Care – PPO | Attending: Internal Medicine

## 2019-04-26 ENCOUNTER — Other Ambulatory Visit: Payer: Self-pay

## 2019-04-26 DIAGNOSIS — Z20822 Contact with and (suspected) exposure to covid-19: Secondary | ICD-10-CM

## 2019-04-27 LAB — NOVEL CORONAVIRUS, NAA: SARS-CoV-2, NAA: NOT DETECTED

## 2019-05-14 ENCOUNTER — Ambulatory Visit: Payer: BC Managed Care – PPO | Admitting: Cardiovascular Disease

## 2019-05-30 ENCOUNTER — Ambulatory Visit: Payer: Medicare HMO | Attending: Internal Medicine

## 2019-05-30 DIAGNOSIS — Z20822 Contact with and (suspected) exposure to covid-19: Secondary | ICD-10-CM | POA: Diagnosis not present

## 2019-05-31 LAB — NOVEL CORONAVIRUS, NAA: SARS-CoV-2, NAA: NOT DETECTED

## 2019-06-14 ENCOUNTER — Ambulatory Visit: Payer: Medicare HMO | Attending: Internal Medicine

## 2019-06-14 DIAGNOSIS — Z20822 Contact with and (suspected) exposure to covid-19: Secondary | ICD-10-CM

## 2019-06-15 LAB — NOVEL CORONAVIRUS, NAA: SARS-CoV-2, NAA: NOT DETECTED

## 2019-06-20 DIAGNOSIS — E039 Hypothyroidism, unspecified: Secondary | ICD-10-CM | POA: Diagnosis not present

## 2019-06-22 DIAGNOSIS — E273 Drug-induced adrenocortical insufficiency: Secondary | ICD-10-CM | POA: Diagnosis not present

## 2019-06-22 DIAGNOSIS — E039 Hypothyroidism, unspecified: Secondary | ICD-10-CM | POA: Diagnosis not present

## 2019-06-22 DIAGNOSIS — E237 Disorder of pituitary gland, unspecified: Secondary | ICD-10-CM | POA: Diagnosis not present

## 2019-06-22 DIAGNOSIS — Z9289 Personal history of other medical treatment: Secondary | ICD-10-CM | POA: Diagnosis not present

## 2019-06-22 DIAGNOSIS — Z7952 Long term (current) use of systemic steroids: Secondary | ICD-10-CM | POA: Diagnosis not present

## 2019-06-25 DIAGNOSIS — E039 Hypothyroidism, unspecified: Secondary | ICD-10-CM | POA: Diagnosis not present

## 2019-06-25 DIAGNOSIS — E237 Disorder of pituitary gland, unspecified: Secondary | ICD-10-CM | POA: Diagnosis not present

## 2019-06-25 DIAGNOSIS — E273 Drug-induced adrenocortical insufficiency: Secondary | ICD-10-CM | POA: Diagnosis not present

## 2019-06-26 DIAGNOSIS — E039 Hypothyroidism, unspecified: Secondary | ICD-10-CM | POA: Diagnosis not present

## 2019-06-26 DIAGNOSIS — E237 Disorder of pituitary gland, unspecified: Secondary | ICD-10-CM | POA: Diagnosis not present

## 2019-07-12 DIAGNOSIS — M542 Cervicalgia: Secondary | ICD-10-CM | POA: Diagnosis not present

## 2019-07-12 DIAGNOSIS — C160 Malignant neoplasm of cardia: Secondary | ICD-10-CM | POA: Diagnosis not present

## 2019-07-13 ENCOUNTER — Telehealth (INDEPENDENT_AMBULATORY_CARE_PROVIDER_SITE_OTHER): Payer: Medicare HMO | Admitting: Family Medicine

## 2019-07-13 ENCOUNTER — Other Ambulatory Visit: Payer: Self-pay

## 2019-07-13 ENCOUNTER — Encounter: Payer: Self-pay | Admitting: Family Medicine

## 2019-07-13 DIAGNOSIS — J0100 Acute maxillary sinusitis, unspecified: Secondary | ICD-10-CM

## 2019-07-13 MED ORDER — AMOXICILLIN-POT CLAVULANATE 875-125 MG PO TABS: 1.0000 | ORAL_TABLET | Freq: Two times a day (BID) | ORAL | 0 refills | Status: DC

## 2019-07-13 NOTE — Progress Notes (Signed)
Virtual Visit via video note  This visit type was conducted due to national recommendations for restrictions regarding the COVID-19 pandemic (e.g. social distancing).  This format is felt to be most appropriate for this patient at this time.  All issues noted in this document were discussed and addressed.  No physical exam was performed (except for noted visual exam findings with Video Visits).   I connected with Jose Hayes today at  3:15 PM EST by a video enabled telemedicine application or telephone and verified that I am speaking with the correct person using two identifiers. Location patient: home Location provider: work  Persons participating in the virtual visit: patient, provider  I discussed the limitations, risks, security and privacy concerns of performing an evaluation and management service by telephone and the availability of in person appointments. I also discussed with the patient that there may be a patient responsible charge related to this service. The patient expressed understanding and agreed to proceed.  Reason for visit: same day  HPI: Sinus infection: Patient notes onset of mild sinus congestion and burning sensation in his sinuses about 1.5 weeks ago.  He notes it hurts more on the left maxillary sinus and the right maxillary sinus.  He does have some tooth discomfort.  No cough.  No postnasal drip.  No smell or taste disturbances.  No fevers.  No chills.  No COVID-19 exposure.  He has been using a sinus spray and a Nettie pot and not getting much out.  This feels similar to prior sinus infections.  He notes he and his wife are both being extremely careful regarding COVID-19 given that he has been immunosuppressed for treatment of esophageal cancer.   ROS: See pertinent positives and negatives per HPI.  Past Medical History:  Diagnosis Date  . Allergy   . Esophageal cancer ([place]) 10/21/2016   GE junction  . GERD (gastroesophageal reflux disease)   .  Hyperglycemia   . Hyperlipidemia   . Hypertension   . SVT (supraventricular tachycardia) ([place])    2013  . Tobacco abuse     Past Surgical History:  Procedure Laterality Date  . COLONOSCOPY    . LAPAROSCOPIC CHOLECYSTECTOMY  1991  . SHOULDER SURGERY Right 1985, 1991, 2000   left x3  . swidom teeth extraction    . UPPER GASTROINTESTINAL ENDOSCOPY      Family History  Problem Relation Age of Onset  . Hypertension Mother   . Hypertension Father   . Pancreatic cancer Father   . Clotting disorder Father        renal cell ca  . Colon cancer Neg Hx   . Esophageal cancer Neg Hx   . Prostate cancer Neg Hx   . Rectal cancer Neg Hx   . Stomach cancer Neg Hx     SOCIAL HX: Former smoker   Current Outpatient Medications:  .  acetaminophen (TYLENOL) 500 MG tablet, Take by mouth as needed. , Disp: , Rfl:  .  albuterol (VENTOLIN HFA) 108 (90 Base) MCG/ACT inhaler, Inhale 2 puffs into the lungs every 6 (six) hours as needed for wheezing or shortness of breath., Disp: 18 g, Rfl: 0 .  ALPRAZolam (XANAX) 1 MG tablet, Take 1 mg by mouth 3 (three) times daily as needed for anxiety., Disp: , Rfl:  .  amLODipine (NORVASC) 5 MG tablet, Take 1 tablet (5 mg total) by mouth daily., Disp: 90 tablet, Rfl: 3 .  dexamethasone (DECADRON) 4 MG tablet, Take 4 mg by mouth  as needed. , Disp: , Rfl:  .  furosemide (LASIX) 20 MG tablet, Take 1 tablet (20 mg) by mouth once daily as needed for swelling/ increased weight/ or shortness of breath, Disp: 30 tablet, Rfl: 5 .  HYDROcodone-acetaminophen (NORCO/VICODIN) 5-325 MG tablet, Take by mouth., Disp: , Rfl:  .  ondansetron (ZOFRAN) 8 MG tablet, Take 1 tablet (8 mg) by mouth every 12 hours 30 minutes prior to each dose of oral chemotherapy to prevent nausea., Disp: , Rfl:  .  predniSONE (DELTASONE) 1 MG tablet, Take 1 mg by mouth daily., Disp: , Rfl:  .  predniSONE (DELTASONE) 5 MG tablet, Take 5 mg by mouth daily., Disp: , Rfl:  .  traMADol (ULTRAM) 50 MG  tablet, Take 1 tablet (50 mg total) by mouth 3 (three) times daily as needed., Disp: 60 tablet, Rfl: 1 .  verapamil (CALAN) 40 MG tablet, Take 1 tablet (40 mg total) by mouth as needed., Disp: 90 tablet, Rfl: 0 .  amoxicillin-clavulanate (AUGMENTIN) 875-125 MG tablet, Take 1 tablet by mouth 2 (two) times daily., Disp: 14 tablet, Rfl: 0 .  levothyroxine (SYNTHROID, LEVOTHROID) 75 MCG tablet, Take 75 mcg by mouth daily before breakfast. , Disp: , Rfl:  .  zolpidem (AMBIEN CR) 6.25 MG CR tablet, Take 6.25 mg by mouth as needed., Disp: , Rfl:   Current Facility-Administered Medications:  .  0.9 %  sodium chloride infusion, 500 mL, Intravenous, Continuous, Fuller Plan, Pricilla Riffle, MD  EXAM:  VITALS per patient if applicable:  GENERAL: alert, oriented, appears well and in no acute distress  HEENT: atraumatic, conjunttiva clear, no obvious abnormalities on inspection of external nose and ears  NECK: normal movements of the head and neck  LUNGS: on inspection no signs of respiratory distress, breathing rate appears normal, no obvious gross SOB, gasping or wheezing  CV: no obvious cyanosis  MS: moves all visible extremities without noticeable abnormality  PSYCH/NEURO: pleasant and cooperative, no obvious depression or anxiety, speech and thought processing grossly intact  ASSESSMENT AND PLAN:  Discussed the following assessment and plan:  Sinusitis Symptoms are consistent with maxillary sinusitis.  Much less likely to be a viral illness or COVID-19.  Discussed treatment with Augmentin for sinusitis.  He will stay quarantined at home over the weekend to be on the safe side while he starts on the Augmentin and if not improving by early next week then we would consider COVID-19 testing and change of antibiotics.  If he is improving this is likely a sinus infection thus he would need no COVID-19 testing.  He will monitor his symptoms and let us know if he is not improving.   No orders of the defined  types were placed in this encounter.   Meds ordered this encounter  Medications  . amoxicillin-clavulanate (AUGMENTIN) 875-125 MG tablet    Sig: Take 1 tablet by mouth 2 (two) times daily.    Dispense:  14 tablet    Refill:  0     I discussed the assessment and treatment plan with the patient. The patient was provided an opportunity to ask questions and all were answered. The patient agreed with the plan and demonstrated an understanding of the instructions.   The patient was advised to call back or seek an in-person evaluation if the symptoms worsen or if the condition fails to improve as anticipated.    Tommi Rumps, MD

## 2019-07-15 DIAGNOSIS — J329 Chronic sinusitis, unspecified: Secondary | ICD-10-CM | POA: Insufficient documentation

## 2019-07-15 NOTE — Assessment & Plan Note (Signed)
Symptoms are consistent with maxillary sinusitis.  Much less likely to be a viral illness or COVID-19.  Discussed treatment with Augmentin for sinusitis.  He will stay quarantined at home over the weekend to be on the safe side while he starts on the Augmentin and if not improving by early next week then we would consider COVID-19 testing and change of antibiotics.  If he is improving this is likely a sinus infection thus he would need no COVID-19 testing.  He will monitor his symptoms and let us know if he is not improving.

## 2019-07-17 ENCOUNTER — Encounter: Payer: Self-pay | Admitting: Family Medicine

## 2019-07-17 DIAGNOSIS — R0981 Nasal congestion: Secondary | ICD-10-CM

## 2019-07-18 MED ORDER — DOXYCYCLINE HYCLATE 100 MG PO TABS: 100.0000 mg | ORAL_TABLET | Freq: Two times a day (BID) | ORAL | 0 refills | Status: DC

## 2019-07-19 ENCOUNTER — Ambulatory Visit: Payer: Medicare HMO | Attending: Internal Medicine

## 2019-07-19 DIAGNOSIS — Z20822 Contact with and (suspected) exposure to covid-19: Secondary | ICD-10-CM | POA: Diagnosis not present

## 2019-07-20 LAB — NOVEL CORONAVIRUS, NAA: SARS-CoV-2, NAA: NOT DETECTED

## 2019-07-24 DIAGNOSIS — R918 Other nonspecific abnormal finding of lung field: Secondary | ICD-10-CM | POA: Diagnosis not present

## 2019-07-24 DIAGNOSIS — C159 Malignant neoplasm of esophagus, unspecified: Secondary | ICD-10-CM | POA: Diagnosis not present

## 2019-07-24 DIAGNOSIS — M542 Cervicalgia: Secondary | ICD-10-CM | POA: Diagnosis not present

## 2019-07-24 DIAGNOSIS — C160 Malignant neoplasm of cardia: Secondary | ICD-10-CM | POA: Diagnosis not present

## 2019-07-24 MED ORDER — MOXIFLOXACIN HCL 400 MG PO TABS: 400.0000 mg | ORAL_TABLET | Freq: Every day | ORAL | 0 refills | Status: DC

## 2019-07-24 NOTE — Addendum Note (Signed)
Addended by: Leone Haven on: 07/24/2019 04:57 PM   Modules accepted: Orders

## 2019-07-26 DIAGNOSIS — Z08 Encounter for follow-up examination after completed treatment for malignant neoplasm: Secondary | ICD-10-CM | POA: Diagnosis not present

## 2019-07-26 DIAGNOSIS — R519 Headache, unspecified: Secondary | ICD-10-CM | POA: Diagnosis not present

## 2019-07-26 DIAGNOSIS — C160 Malignant neoplasm of cardia: Secondary | ICD-10-CM | POA: Diagnosis not present

## 2019-07-26 DIAGNOSIS — Z87891 Personal history of nicotine dependence: Secondary | ICD-10-CM | POA: Diagnosis not present

## 2019-07-26 DIAGNOSIS — Z8501 Personal history of malignant neoplasm of esophagus: Secondary | ICD-10-CM | POA: Diagnosis not present

## 2019-07-27 MED ORDER — FLUTICASONE PROPIONATE 50 MCG/ACT NA SUSP: 2.0000 | Freq: Every day | NASAL | 6 refills | Status: DC

## 2019-07-27 NOTE — Addendum Note (Signed)
Addended by: Leone Haven on: 07/27/2019 05:11 PM   Modules accepted: Orders

## 2019-08-07 DIAGNOSIS — J342 Deviated nasal septum: Secondary | ICD-10-CM | POA: Insufficient documentation

## 2019-08-07 DIAGNOSIS — Z720 Tobacco use: Secondary | ICD-10-CM | POA: Diagnosis not present

## 2019-08-07 DIAGNOSIS — Z8509 Personal history of malignant neoplasm of other digestive organs: Secondary | ICD-10-CM | POA: Diagnosis not present

## 2019-08-07 DIAGNOSIS — J329 Chronic sinusitis, unspecified: Secondary | ICD-10-CM | POA: Diagnosis not present

## 2019-08-07 DIAGNOSIS — C160 Malignant neoplasm of cardia: Secondary | ICD-10-CM | POA: Diagnosis not present

## 2019-08-07 DIAGNOSIS — Z8709 Personal history of other diseases of the respiratory system: Secondary | ICD-10-CM | POA: Diagnosis not present

## 2019-08-07 DIAGNOSIS — J3489 Other specified disorders of nose and nasal sinuses: Secondary | ICD-10-CM | POA: Diagnosis not present

## 2019-08-07 DIAGNOSIS — Z7951 Long term (current) use of inhaled steroids: Secondary | ICD-10-CM | POA: Diagnosis not present

## 2019-08-07 DIAGNOSIS — J343 Hypertrophy of nasal turbinates: Secondary | ICD-10-CM | POA: Diagnosis not present

## 2019-08-10 DIAGNOSIS — C787 Secondary malignant neoplasm of liver and intrahepatic bile duct: Secondary | ICD-10-CM | POA: Diagnosis not present

## 2019-08-10 DIAGNOSIS — C160 Malignant neoplasm of cardia: Secondary | ICD-10-CM | POA: Diagnosis not present

## 2019-08-10 DIAGNOSIS — M542 Cervicalgia: Secondary | ICD-10-CM | POA: Diagnosis not present

## 2019-08-10 DIAGNOSIS — E2740 Unspecified adrenocortical insufficiency: Secondary | ICD-10-CM | POA: Diagnosis not present

## 2019-08-10 DIAGNOSIS — R519 Headache, unspecified: Secondary | ICD-10-CM | POA: Diagnosis not present

## 2019-08-10 DIAGNOSIS — Z79891 Long term (current) use of opiate analgesic: Secondary | ICD-10-CM | POA: Diagnosis not present

## 2019-08-10 DIAGNOSIS — E273 Drug-induced adrenocortical insufficiency: Secondary | ICD-10-CM | POA: Diagnosis not present

## 2019-08-10 DIAGNOSIS — Z87891 Personal history of nicotine dependence: Secondary | ICD-10-CM | POA: Diagnosis not present

## 2019-08-14 DIAGNOSIS — E291 Testicular hypofunction: Secondary | ICD-10-CM | POA: Diagnosis not present

## 2019-08-16 DIAGNOSIS — E273 Drug-induced adrenocortical insufficiency: Secondary | ICD-10-CM | POA: Diagnosis not present

## 2019-08-16 DIAGNOSIS — E039 Hypothyroidism, unspecified: Secondary | ICD-10-CM | POA: Diagnosis not present

## 2019-08-16 DIAGNOSIS — G4733 Obstructive sleep apnea (adult) (pediatric): Secondary | ICD-10-CM | POA: Diagnosis not present

## 2019-08-16 DIAGNOSIS — E291 Testicular hypofunction: Secondary | ICD-10-CM | POA: Diagnosis not present

## 2019-10-03 DIAGNOSIS — M7542 Impingement syndrome of left shoulder: Secondary | ICD-10-CM | POA: Diagnosis not present

## 2019-10-03 DIAGNOSIS — G8929 Other chronic pain: Secondary | ICD-10-CM | POA: Diagnosis not present

## 2019-10-03 DIAGNOSIS — M7522 Bicipital tendinitis, left shoulder: Secondary | ICD-10-CM | POA: Diagnosis not present

## 2019-10-03 DIAGNOSIS — M25512 Pain in left shoulder: Secondary | ICD-10-CM | POA: Diagnosis not present

## 2019-10-03 DIAGNOSIS — E669 Obesity, unspecified: Secondary | ICD-10-CM | POA: Diagnosis not present

## 2019-10-29 DIAGNOSIS — E291 Testicular hypofunction: Secondary | ICD-10-CM | POA: Diagnosis not present

## 2019-10-29 DIAGNOSIS — T451X5A Adverse effect of antineoplastic and immunosuppressive drugs, initial encounter: Secondary | ICD-10-CM | POA: Diagnosis not present

## 2019-10-29 DIAGNOSIS — K30 Functional dyspepsia: Secondary | ICD-10-CM | POA: Diagnosis not present

## 2019-10-29 DIAGNOSIS — R5383 Other fatigue: Secondary | ICD-10-CM | POA: Diagnosis not present

## 2019-10-29 DIAGNOSIS — C159 Malignant neoplasm of esophagus, unspecified: Secondary | ICD-10-CM | POA: Diagnosis not present

## 2019-10-29 DIAGNOSIS — C160 Malignant neoplasm of cardia: Secondary | ICD-10-CM | POA: Diagnosis not present

## 2019-10-29 DIAGNOSIS — E273 Drug-induced adrenocortical insufficiency: Secondary | ICD-10-CM | POA: Diagnosis not present

## 2019-10-29 DIAGNOSIS — R69 Illness, unspecified: Secondary | ICD-10-CM | POA: Diagnosis not present

## 2019-10-29 DIAGNOSIS — E039 Hypothyroidism, unspecified: Secondary | ICD-10-CM | POA: Diagnosis not present

## 2019-10-29 DIAGNOSIS — R52 Pain, unspecified: Secondary | ICD-10-CM | POA: Diagnosis not present

## 2019-10-29 DIAGNOSIS — Z87891 Personal history of nicotine dependence: Secondary | ICD-10-CM | POA: Diagnosis not present

## 2019-10-29 DIAGNOSIS — R918 Other nonspecific abnormal finding of lung field: Secondary | ICD-10-CM | POA: Diagnosis not present

## 2019-11-02 DIAGNOSIS — E291 Testicular hypofunction: Secondary | ICD-10-CM | POA: Diagnosis not present

## 2019-11-05 DIAGNOSIS — I10 Essential (primary) hypertension: Secondary | ICD-10-CM | POA: Diagnosis not present

## 2019-11-05 DIAGNOSIS — Z85028 Personal history of other malignant neoplasm of stomach: Secondary | ICD-10-CM | POA: Diagnosis not present

## 2019-11-05 DIAGNOSIS — E236 Other disorders of pituitary gland: Secondary | ICD-10-CM | POA: Diagnosis not present

## 2019-11-05 DIAGNOSIS — Z8589 Personal history of malignant neoplasm of other organs and systems: Secondary | ICD-10-CM | POA: Diagnosis not present

## 2019-11-05 DIAGNOSIS — Z809 Family history of malignant neoplasm, unspecified: Secondary | ICD-10-CM | POA: Diagnosis not present

## 2019-11-05 DIAGNOSIS — E291 Testicular hypofunction: Secondary | ICD-10-CM | POA: Diagnosis not present

## 2019-11-05 DIAGNOSIS — Z7952 Long term (current) use of systemic steroids: Secondary | ICD-10-CM | POA: Diagnosis not present

## 2019-11-05 DIAGNOSIS — G4733 Obstructive sleep apnea (adult) (pediatric): Secondary | ICD-10-CM | POA: Diagnosis not present

## 2019-11-05 DIAGNOSIS — K219 Gastro-esophageal reflux disease without esophagitis: Secondary | ICD-10-CM | POA: Diagnosis not present

## 2019-11-06 DIAGNOSIS — J342 Deviated nasal septum: Secondary | ICD-10-CM | POA: Diagnosis not present

## 2019-11-06 DIAGNOSIS — J324 Chronic pansinusitis: Secondary | ICD-10-CM | POA: Diagnosis not present

## 2019-11-06 DIAGNOSIS — R42 Dizziness and giddiness: Secondary | ICD-10-CM | POA: Diagnosis not present

## 2019-11-15 DIAGNOSIS — E291 Testicular hypofunction: Secondary | ICD-10-CM | POA: Diagnosis not present

## 2019-11-15 DIAGNOSIS — E039 Hypothyroidism, unspecified: Secondary | ICD-10-CM | POA: Diagnosis not present

## 2019-11-15 DIAGNOSIS — R69 Illness, unspecified: Secondary | ICD-10-CM | POA: Diagnosis not present

## 2019-11-15 DIAGNOSIS — E2740 Unspecified adrenocortical insufficiency: Secondary | ICD-10-CM | POA: Diagnosis not present

## 2019-11-15 DIAGNOSIS — Z9289 Personal history of other medical treatment: Secondary | ICD-10-CM | POA: Diagnosis not present

## 2019-11-15 DIAGNOSIS — G4733 Obstructive sleep apnea (adult) (pediatric): Secondary | ICD-10-CM | POA: Diagnosis not present

## 2019-11-16 DIAGNOSIS — Z20822 Contact with and (suspected) exposure to covid-19: Secondary | ICD-10-CM | POA: Diagnosis not present

## 2019-11-16 DIAGNOSIS — Z03818 Encounter for observation for suspected exposure to other biological agents ruled out: Secondary | ICD-10-CM | POA: Diagnosis not present

## 2019-11-20 DIAGNOSIS — R69 Illness, unspecified: Secondary | ICD-10-CM | POA: Diagnosis not present

## 2019-11-21 DIAGNOSIS — G4733 Obstructive sleep apnea (adult) (pediatric): Secondary | ICD-10-CM | POA: Diagnosis not present

## 2019-11-21 DIAGNOSIS — G4730 Sleep apnea, unspecified: Secondary | ICD-10-CM | POA: Diagnosis not present

## 2019-12-12 DIAGNOSIS — R42 Dizziness and giddiness: Secondary | ICD-10-CM | POA: Diagnosis not present

## 2019-12-12 DIAGNOSIS — R110 Nausea: Secondary | ICD-10-CM | POA: Diagnosis not present

## 2019-12-12 DIAGNOSIS — T50905A Adverse effect of unspecified drugs, medicaments and biological substances, initial encounter: Secondary | ICD-10-CM | POA: Diagnosis not present

## 2019-12-12 DIAGNOSIS — E273 Drug-induced adrenocortical insufficiency: Secondary | ICD-10-CM | POA: Diagnosis not present

## 2019-12-12 DIAGNOSIS — R930 Abnormal findings on diagnostic imaging of skull and head, not elsewhere classified: Secondary | ICD-10-CM | POA: Diagnosis not present

## 2019-12-12 DIAGNOSIS — C160 Malignant neoplasm of cardia: Secondary | ICD-10-CM | POA: Diagnosis not present

## 2019-12-12 DIAGNOSIS — E237 Disorder of pituitary gland, unspecified: Secondary | ICD-10-CM | POA: Diagnosis not present

## 2019-12-21 DIAGNOSIS — C160 Malignant neoplasm of cardia: Secondary | ICD-10-CM | POA: Diagnosis not present

## 2019-12-21 DIAGNOSIS — H9190 Unspecified hearing loss, unspecified ear: Secondary | ICD-10-CM | POA: Diagnosis not present

## 2019-12-21 DIAGNOSIS — E039 Hypothyroidism, unspecified: Secondary | ICD-10-CM | POA: Diagnosis not present

## 2019-12-21 DIAGNOSIS — D497 Neoplasm of unspecified behavior of endocrine glands and other parts of nervous system: Secondary | ICD-10-CM | POA: Diagnosis not present

## 2019-12-21 DIAGNOSIS — R9082 White matter disease, unspecified: Secondary | ICD-10-CM | POA: Diagnosis not present

## 2019-12-21 DIAGNOSIS — E230 Hypopituitarism: Secondary | ICD-10-CM | POA: Diagnosis not present

## 2019-12-25 DIAGNOSIS — H933X3 Disorders of bilateral acoustic nerves: Secondary | ICD-10-CM | POA: Diagnosis not present

## 2019-12-25 DIAGNOSIS — H933X9 Disorders of unspecified acoustic nerve: Secondary | ICD-10-CM | POA: Diagnosis not present

## 2019-12-26 DIAGNOSIS — G4733 Obstructive sleep apnea (adult) (pediatric): Secondary | ICD-10-CM | POA: Diagnosis not present

## 2019-12-26 DIAGNOSIS — G4730 Sleep apnea, unspecified: Secondary | ICD-10-CM | POA: Diagnosis not present

## 2019-12-31 DIAGNOSIS — Z87891 Personal history of nicotine dependence: Secondary | ICD-10-CM | POA: Diagnosis not present

## 2019-12-31 DIAGNOSIS — C160 Malignant neoplasm of cardia: Secondary | ICD-10-CM | POA: Diagnosis not present

## 2019-12-31 DIAGNOSIS — H9193 Unspecified hearing loss, bilateral: Secondary | ICD-10-CM | POA: Diagnosis not present

## 2019-12-31 DIAGNOSIS — C787 Secondary malignant neoplasm of liver and intrahepatic bile duct: Secondary | ICD-10-CM | POA: Diagnosis not present

## 2019-12-31 DIAGNOSIS — R42 Dizziness and giddiness: Secondary | ICD-10-CM | POA: Diagnosis not present

## 2019-12-31 DIAGNOSIS — R110 Nausea: Secondary | ICD-10-CM | POA: Diagnosis not present

## 2019-12-31 DIAGNOSIS — R509 Fever, unspecified: Secondary | ICD-10-CM | POA: Diagnosis not present

## 2019-12-31 DIAGNOSIS — H9313 Tinnitus, bilateral: Secondary | ICD-10-CM | POA: Diagnosis not present

## 2020-01-01 DIAGNOSIS — D333 Benign neoplasm of cranial nerves: Secondary | ICD-10-CM | POA: Diagnosis not present

## 2020-02-05 DIAGNOSIS — R69 Illness, unspecified: Secondary | ICD-10-CM | POA: Diagnosis not present

## 2020-02-22 DIAGNOSIS — R519 Headache, unspecified: Secondary | ICD-10-CM | POA: Diagnosis not present

## 2020-02-22 DIAGNOSIS — D497 Neoplasm of unspecified behavior of endocrine glands and other parts of nervous system: Secondary | ICD-10-CM | POA: Diagnosis not present

## 2020-02-22 DIAGNOSIS — G939 Disorder of brain, unspecified: Secondary | ICD-10-CM | POA: Diagnosis not present

## 2020-02-28 ENCOUNTER — Encounter: Payer: Self-pay | Admitting: Family Medicine

## 2020-02-28 NOTE — Telephone Encounter (Signed)
I called patient to find out more information. I asked if the area was hot to the touch. He stated that it was not. There are no tears in the skin, no oozing of the skin or drainage. He said that the discolored area goes form his hip down to cover his whole right thigh. He said that are first the area had an orange tint & now has turned to a brown color. 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 When he tried to wash it off it was still there. He has an MRI w/ contrast the day before it appeared & sent pics to physicians at [facility] who did not think it was an allergic reaction to contrast. It also does not appear to be hives bc it has not disappeared. It does not bother patient it is just strange & isn't going away. He will send pics which I will forward to you.

## 2020-02-29 ENCOUNTER — Other Ambulatory Visit: Payer: Self-pay

## 2020-02-29 ENCOUNTER — Encounter: Payer: Self-pay | Admitting: Emergency Medicine

## 2020-02-29 ENCOUNTER — Ambulatory Visit
Admission: EM | Admit: 2020-02-29 | Discharge: 2020-02-29 | Disposition: A | Discharge status: Home / Self Care | Payer: Medicare HMO

## 2020-02-29 DIAGNOSIS — R21 Rash and other nonspecific skin eruption: Secondary | ICD-10-CM

## 2020-02-29 NOTE — Discharge Instructions (Signed)
You may apply cortisone cream to the area twice a day  You may moisturize the area with aquaphor as needed  I do not see any signs of infection at this point and am suspecting a chemical burn

## 2020-02-29 NOTE — ED Triage Notes (Signed)
Patient c/o Rash on RT Hip x 1 week.   Patient had brain MRI on Friday night of last week w/ contrast. Patient stated he wonders if contrast caused a reaction.   Patient denies pain.

## 2020-02-29 NOTE — ED Provider Notes (Signed)
Jose Hayes   [id number] 02/29/20 Arrival Time: 1222  CC: RASH  SUBJECTIVE:  Jose Hayes is a 59 y.o. male who presents with a skin complaint that began 6 days ago. Reports that the rash is darker than his surrounding skin and that the area is beginning to peel on the R hip. Reports that he had an MRI with contrast 7 days ago and suspected that this may have caused the rash. Also reports that he was working in sweat pants that were exposed to bleach. Denies precipitating event or trauma. Has not attempted OTC treatment.  There are no aggravating or alleviating factors. Denies similar symptoms in the past.  Denies fever, chills, nausea, vomiting, swelling, discharge, oral lesions, SOB, chest pain, abdominal pain, changes in bowel or bladder function.    ROS: As per HPI.  All other pertinent ROS negative.     Past Medical History:  Diagnosis Date  . Allergy   . Esophageal cancer ([place]) 10/21/2016   GE junction  . GERD (gastroesophageal reflux disease)   . Hyperglycemia   . Hyperlipidemia   . Hypertension   . SVT (supraventricular tachycardia) ([place])    2013  . Tobacco abuse    Past Surgical History:  Procedure Laterality Date  . COLONOSCOPY    . LAPAROSCOPIC CHOLECYSTECTOMY  1991  . SHOULDER SURGERY Right 1985, 1991, 2000   left x3  . swidom teeth extraction    . UPPER GASTROINTESTINAL ENDOSCOPY     No Known Allergies Current Facility-Administered Medications on File Prior to Encounter  Medication Dose Route Frequency Provider Last Rate Last Admin  . 0.9 %  sodium chloride infusion  500 mL Intravenous Continuous Ladene Artist, MD       Current Outpatient Medications on File Prior to Encounter  Medication Sig Dispense Refill  . ALPRAZolam (XANAX) 1 MG tablet Take 1 mg by mouth 3 (three) times daily as needed for anxiety.    . hydrocortisone (CORTEF) 10 MG tablet Take 10 mg by mouth daily.    Marland Kitchen levothyroxine (SYNTHROID) 88 MCG tablet Take 88 mcg by mouth  daily before breakfast.    . acetaminophen (TYLENOL) 500 MG tablet Take by mouth as needed.     Marland Kitchen albuterol (VENTOLIN HFA) 108 (90 Base) MCG/ACT inhaler Inhale 2 puffs into the lungs every 6 (six) hours as needed for wheezing or shortness of breath. 18 g 0  . amLODipine (NORVASC) 5 MG tablet Take 1 tablet (5 mg total) by mouth daily. 90 tablet 3  . dexamethasone (DECADRON) 4 MG tablet Take 4 mg by mouth as needed.     . doxycycline (VIBRA-TABS) 100 MG tablet Take 1 tablet (100 mg total) by mouth 2 (two) times daily. 14 tablet 0  . fluticasone (FLONASE) 50 MCG/ACT nasal spray Place 2 sprays into both nostrils daily. 16 g 6  . furosemide (LASIX) 20 MG tablet Take 1 tablet (20 mg) by mouth once daily as needed for swelling/ increased weight/ or shortness of breath 30 tablet 5  . HYDROcodone-acetaminophen (NORCO/VICODIN) 5-325 MG tablet Take by mouth.    . levothyroxine (SYNTHROID, LEVOTHROID) 75 MCG tablet Take 75 mcg by mouth daily before breakfast.     . moxifloxacin (AVELOX) 400 MG tablet Take 1 tablet (400 mg total) by mouth daily. 5 tablet 0  . ondansetron (ZOFRAN) 8 MG tablet Take 1 tablet (8 mg) by mouth every 12 hours 30 minutes prior to each dose of oral chemotherapy to prevent nausea.    Marland Kitchen  predniSONE (DELTASONE) 1 MG tablet Take 1 mg by mouth daily.    . predniSONE (DELTASONE) 5 MG tablet Take 5 mg by mouth daily.    . traMADol (ULTRAM) 50 MG tablet Take 1 tablet (50 mg total) by mouth 3 (three) times daily as needed. 60 tablet 1  . verapamil (CALAN) 40 MG tablet Take 1 tablet (40 mg total) by mouth as needed. 90 tablet 0  . zolpidem (AMBIEN CR) 6.25 MG CR tablet Take 6.25 mg by mouth as needed.     Social History   Socioeconomic History  . Marital status: Married    Spouse name: Not on file  . Number of children: Not on file  . Years of education: Not on file  . Highest education level: Not on file  Occupational History  . Not on file  Tobacco Use  . Smoking status: Former Smoker     Packs/day: 0.50    Years: 25.00    Pack years: 12.50    Types: Cigarettes  . Smokeless tobacco: Never Used  . Tobacco comment: trying gum to quit  Vaping Use  . Vaping Use: Some days  Substance and Sexual Activity  . Alcohol use: Yes    Comment: occasionally  . Drug use: No  . Sexual activity: Not on file  Other Topics Concern  . Not on file  Social History Narrative  . Not on file   Social Determinants of Health   Financial Resource Strain:   . Difficulty of Paying Living Expenses: Not on file  Food Insecurity:   . Worried About Charity fundraiser in the Last Year: Not on file  . Ran Out of Food in the Last Year: Not on file  Transportation Needs:   . Lack of Transportation (Medical): Not on file  . Lack of Transportation (Non-Medical): Not on file  Physical Activity:   . Days of Exercise per Week: Not on file  . Minutes of Exercise per Session: Not on file  Stress:   . Feeling of Stress : Not on file  Social Connections:   . Frequency of Communication with Friends and Family: Not on file  . Frequency of Social Gatherings with Friends and Family: Not on file  . Attends Religious Services: Not on file  . Active Member of Clubs or Organizations: Not on file  . Attends Archivist Meetings: Not on file  . Marital Status: Not on file  Intimate Partner Violence:   . Fear of Current or Ex-Partner: Not on file  . Emotionally Abused: Not on file  . Physically Abused: Not on file  . Sexually Abused: Not on file   Family History  Problem Relation Age of Onset  . Hypertension Mother   . Hypertension Father   . Pancreatic cancer Father   . Clotting disorder Father        renal cell ca  . Colon cancer Neg Hx   . Esophageal cancer Neg Hx   . Prostate cancer Neg Hx   . Rectal cancer Neg Hx   . Stomach cancer Neg Hx     OBJECTIVE: Vitals:   02/29/20 1352 02/29/20 1357  BP:  (!) 154/100  Pulse:  62  Resp:  20  Temp:  98.7 F (37.1 C)  TempSrc:  Oral    SpO2:  95%  Weight: 240 lb (108.9 kg)   Height: 5\' 8"  (1.727 m)     General appearance: alert; no distress Head: NCAT Lungs: clear to auscultation  bilaterally Heart: regular rate and rhythm.  Radial pulse 2+ bilaterally Extremities: no edema Skin: warm and dry; about 10 x 10 cm area of irregularly shaped darkened skin to the R hip, with area of outer layer of skin peeling. New skin under peeling skin is pink with no tenderness, drainage, heat to the area.  Psychological: alert and cooperative; normal mood and affect  ASSESSMENT & PLAN:  1. Rash and nonspecific skin eruption    Suspect chemical burn May use hydrocortisone cream as needed May use aquaphor as needed No indications of infection today Avoid hot showers/ baths Moisturize skin daily  Follow up with PCP if symptoms persists Return or go to the ER if you have any new or worsening symptoms such as fever, chills, nausea, vomiting, redness, swelling, discharge, if symptoms do not improve with medications  Reviewed expectations re: course of current medical issues. Questions answered. Outlined signs and symptoms indicating need for more acute intervention. Patient verbalized understanding. After Visit Summary given.   Faustino Congress, NP 02/29/20 1418

## 2020-03-11 DIAGNOSIS — G4733 Obstructive sleep apnea (adult) (pediatric): Secondary | ICD-10-CM | POA: Diagnosis not present

## 2020-03-11 DIAGNOSIS — R69 Illness, unspecified: Secondary | ICD-10-CM | POA: Diagnosis not present

## 2020-03-11 DIAGNOSIS — Z8501 Personal history of malignant neoplasm of esophagus: Secondary | ICD-10-CM | POA: Diagnosis not present

## 2020-03-11 DIAGNOSIS — Z85028 Personal history of other malignant neoplasm of stomach: Secondary | ICD-10-CM | POA: Diagnosis not present

## 2020-03-24 DIAGNOSIS — Z8509 Personal history of malignant neoplasm of other digestive organs: Secondary | ICD-10-CM | POA: Diagnosis not present

## 2020-03-24 DIAGNOSIS — R42 Dizziness and giddiness: Secondary | ICD-10-CM | POA: Diagnosis not present

## 2020-03-24 DIAGNOSIS — Z87891 Personal history of nicotine dependence: Secondary | ICD-10-CM | POA: Diagnosis not present

## 2020-03-24 DIAGNOSIS — C160 Malignant neoplasm of cardia: Secondary | ICD-10-CM | POA: Diagnosis not present

## 2020-03-24 DIAGNOSIS — Z23 Encounter for immunization: Secondary | ICD-10-CM | POA: Diagnosis not present

## 2020-03-24 DIAGNOSIS — R531 Weakness: Secondary | ICD-10-CM | POA: Diagnosis not present

## 2020-03-24 DIAGNOSIS — Z08 Encounter for follow-up examination after completed treatment for malignant neoplasm: Secondary | ICD-10-CM | POA: Diagnosis not present

## 2020-03-24 DIAGNOSIS — R5383 Other fatigue: Secondary | ICD-10-CM | POA: Diagnosis not present

## 2020-03-24 DIAGNOSIS — H9313 Tinnitus, bilateral: Secondary | ICD-10-CM | POA: Diagnosis not present

## 2020-03-24 DIAGNOSIS — E236 Other disorders of pituitary gland: Secondary | ICD-10-CM | POA: Diagnosis not present

## 2020-03-24 DIAGNOSIS — R69 Illness, unspecified: Secondary | ICD-10-CM | POA: Diagnosis not present

## 2020-03-24 DIAGNOSIS — R918 Other nonspecific abnormal finding of lung field: Secondary | ICD-10-CM | POA: Diagnosis not present

## 2020-03-24 DIAGNOSIS — E230 Hypopituitarism: Secondary | ICD-10-CM | POA: Diagnosis not present

## 2020-03-26 NOTE — Progress Notes (Signed)
Cardiology Office Note    Date:  03/27/2020   ID:  Jose Hayes, DOB 09-03-1960, MRN [id number]  PCP:  Jose Haven, MD  Cardiologist:  Jose Rogue, MD  Electrophysiologist:  None   Chief Complaint: Follow up  History of Present Illness:   Jose Hayes is a 59 y.o. male with history of SVT, gastroesophageal cancer status post chemotherapy complicated by lymphocytic hypophysitis, HTN, HLD, obesity, ongoing tobacco use, and GERD who presents for follow-up of his SVT.  He was seen in the ED in 10/2011 with SVT with rhythm converting to sinus rhythm while in the ED with possible adenosine administration.  Echo in 11/2011 with normal LVSF, diastolic dysfunction, mild concentric LVH, normal RVSP, and no significant valvular abnormalities.  He was most recently seen in the office in 03/2019 and was doing well from a cardiac perspective.  Over the past year he has continued to follow closely with his [facility] oncology team.  In the setting of his lymphocytic hypophysitis he is now on thyroid replacement and steroid replacement therapy.  With this he describes his health as being a "roller coaster."  He has developed a constellation of symptoms including vertigo, dizziness, profound fatigue and weakness as well as low-grade fevers that are episodic of uncertain etiology.  He has undergone further imaging per his [facility] team without any evidence of definitive metastatic disease.  She does indicate follow-up imaging has revealed punctate enhancing foci within the bilateral auditory canals of uncertain etiology favored to be nonmetastatic disease and possibly acoustic neuroma.  He will be following up with neurology.  In the setting of all of the above he continues to deal with significant stress and is followed by counseling and psychiatry.  During these episodes of vertigo/dizziness/fatigue he denies any chest pain, dyspnea, or palpitations.  No presyncope or syncope.  There is some associated  nausea without emesis with his dizziness.  Since he was last seen he has been taken off amlodipine due to low BP.  However, more recently his BP has been trending up and has been as high as 119 systolic.  He has not needed any as needed Lasix or verapamil.  Over the years since his diagnosis of SVT in 2013 he has reported intermittent similar palpitations though nothing sustained and no symptoms associated with his above dizziness.  He does note some sock indentation at the end of the day if he has been up on his feet which typically improves when laying supine overnight.    Labs independently reviewed: 03/2020 - Hgb 15.1, PLT 206, potassium 3.9, BUN 21, serum creatinine 0.9, albumin 4.2, AST/ALT normal 11/2019 - TSH normal 06/2019 - A1c 5.8 09/2018 - TC 182, TG 62, HDL 61, LDL 109  Past Medical History:  Diagnosis Date  . Allergy   . Esophageal cancer ([place]) 10/21/2016   GE junction  . GERD (gastroesophageal reflux disease)   . Hyperglycemia   . Hyperlipidemia   . Hypertension   . SVT (supraventricular tachycardia) ([place])    2013  . Tobacco abuse     Past Surgical History:  Procedure Laterality Date  . COLONOSCOPY    . LAPAROSCOPIC CHOLECYSTECTOMY  1991  . SHOULDER SURGERY Right 1985, 1991, 2000   left x3  . swidom teeth extraction    . UPPER GASTROINTESTINAL ENDOSCOPY      Current Medications: Current Meds  Medication Sig  . ALPRAZolam (XANAX) 1 MG tablet Take 1 mg by mouth 3 (three) times daily as  needed for anxiety.  Marland Kitchen amLODipine (NORVASC) 5 MG tablet Take 1 tablet (5 mg total) by mouth daily.  Marland Kitchen escitalopram (LEXAPRO) 10 MG tablet Take 10 mg by mouth daily.  . fluticasone (FLONASE) 50 MCG/ACT nasal spray Place 2 sprays into both nostrils daily.  . furosemide (LASIX) 20 MG tablet Take 1 tablet (20 mg) by mouth once daily as needed for swelling/ increased weight/ or shortness of breath  . HYDROcodone-acetaminophen (NORCO/VICODIN) 5-325 MG tablet Take by mouth.  .  hydrocortisone (CORTEF) 10 MG tablet Take 10 mg by mouth daily.  Marland Kitchen levothyroxine (SYNTHROID) 88 MCG tablet Take 88 mcg by mouth daily before breakfast.  . LORazepam (ATIVAN) 1 MG tablet Take 1 mg by mouth 3 (three) times daily.  Marland Kitchen testosterone cypionate (DEPOTESTOSTERONE CYPIONATE) 200 MG/ML injection Inject into the muscle.   Current Facility-Administered Medications for the 03/27/20 encounter (Office Visit) with Jose Mu, PA-C  Medication  . 0.9 %  sodium chloride infusion    Allergies:   Patient has no known allergies.   Social History   Socioeconomic History  . Marital status: Married    Spouse name: Not on file  . Number of children: Not on file  . Years of education: Not on file  . Highest education level: Not on file  Occupational History  . Not on file  Tobacco Use  . Smoking status: Former Smoker    Packs/day: 0.50    Years: 25.00    Pack years: 12.50    Types: Cigarettes  . Smokeless tobacco: Never Used  . Tobacco comment: trying gum to quit  Vaping Use  . Vaping Use: Some days  Substance and Sexual Activity  . Alcohol use: Yes    Comment: occasionally  . Drug use: No  . Sexual activity: Not on file  Other Topics Concern  . Not on file  Social History Narrative  . Not on file   Social Determinants of Health   Financial Resource Strain:   . Difficulty of Paying Living Expenses: Not on file  Food Insecurity:   . Worried About Charity fundraiser in the Last Year: Not on file  . Ran Out of Food in the Last Year: Not on file  Transportation Needs:   . Lack of Transportation (Medical): Not on file  . Lack of Transportation (Non-Medical): Not on file  Physical Activity:   . Days of Exercise per Week: Not on file  . Minutes of Exercise per Session: Not on file  Stress:   . Feeling of Stress : Not on file  Social Connections:   . Frequency of Communication with Friends and Family: Not on file  . Frequency of Social Gatherings with Friends and Family:  Not on file  . Attends Religious Services: Not on file  . Active Member of Clubs or Organizations: Not on file  . Attends Archivist Meetings: Not on file  . Marital Status: Not on file     Family History:  The patient's family history includes Clotting disorder in his father; Hypertension in his father and mother; Pancreatic cancer in his father. There is no history of Colon cancer, Esophageal cancer, Prostate cancer, Rectal cancer, or Stomach cancer.  ROS:   Review of Systems  Constitutional: Positive for fever and malaise/fatigue. Negative for chills, diaphoresis and weight loss.  HENT: Negative for congestion.   Eyes: Negative for discharge and redness.  Respiratory: Negative for cough, sputum production, shortness of breath and wheezing.   Cardiovascular:  Positive for leg swelling. Negative for chest pain, palpitations, orthopnea, claudication and PND.  Gastrointestinal: Negative for abdominal pain, heartburn, nausea and vomiting.  Musculoskeletal: Negative for falls and myalgias.  Skin: Negative for rash.  Neurological: Positive for dizziness and weakness. Negative for tingling, tremors, sensory change, speech change, focal weakness and loss of consciousness.  Endo/Heme/Allergies: Does not bruise/bleed easily.  Psychiatric/Behavioral: Negative for substance abuse. The patient is nervous/anxious.   All other systems reviewed and are negative.    EKGs/Labs/Other Studies Reviewed:    Studies reviewed were summarized above. The additional studies were reviewed today:  2D echo 11/2011: EF greater than 10%, diastolic dysfunction, mild concentric LVH, normal RV systolic function, normal RVSP, no significant valvular abnormalities.  EKG:  EKG is ordered today.  The EKG ordered today demonstrates NSR, 68 bpm, incomplete RBBB, no acute ST-T changes, no significant changes when compared to prior tracing  Recent Labs: No results found for requested labs within last 8760 hours.   Recent Lipid Panel    Component Value Date/Time   CHOL 191 11/06/2011 0427   TRIG 107 11/06/2011 0427   HDL 41 11/06/2011 0427   VLDL 21 11/06/2011 0427   LDLCALC 129 (H) 11/06/2011 0427    PHYSICAL EXAM:    VS:  BP (!) 140/92 (BP Location: Left Arm, Patient Position: Sitting, Cuff Size: Large)   Pulse 68   Ht 5\' 8"  (1.727 m)   Wt 259 lb (117.5 kg)   SpO2 95%   BMI 39.38 kg/m   BMI: Body mass index is 39.38 kg/m.  Physical Exam Constitutional:      Appearance: He is well-developed.  HENT:     Head: Normocephalic and atraumatic.  Eyes:     General:        Right eye: No discharge.        Left eye: No discharge.  Neck:     Vascular: No JVD.  Cardiovascular:     Rate and Rhythm: Normal rate and regular rhythm.     Pulses: No midsystolic click and no opening snap.          Posterior tibial pulses are 2+ on the right side and 2+ on the left side.     Heart sounds: Normal heart sounds, S1 normal and S2 normal. Heart sounds not distant. No murmur heard.  No friction rub.  Pulmonary:     Effort: Pulmonary effort is normal. No respiratory distress.     Breath sounds: Normal breath sounds. No decreased breath sounds, wheezing or rales.  Chest:     Chest wall: No tenderness.  Abdominal:     General: There is no distension.     Palpations: Abdomen is soft.     Tenderness: There is no abdominal tenderness.  Musculoskeletal:     Cervical back: Normal range of motion.     Right lower leg: Edema present.     Left lower leg: Edema present.     Comments: Trace bilateral pretibial edema.  Skin:    General: Skin is warm and dry.     Nails: There is no clubbing.  Neurological:     Mental Status: He is alert and oriented to person, place, and time.  Psychiatric:        Speech: Speech normal.        Behavior: Behavior normal.        Thought Content: Thought content normal.        Judgment: Judgment normal.     Wt Readings  from Last 3 Encounters:  03/27/20 259 lb (117.5 kg)   02/29/20 240 lb (108.9 kg)  07/13/19 240 lb (108.9 kg)     ASSESSMENT & PLAN:   1. SVT: Quiescent.  Not currently requiring any rate controlling scheduled medication.  Continue as needed short acting verapamil.  2. HTN: Blood pressure is mildly elevated at triage with a reading of 140/92.  His elevated BP readings are likely in the setting of ongoing stress surrounding his health.  Resume amlodipine 5 mg daily with close monitoring for soft BP.  3. HLD: LDL 109 from 09/2018.  Prior noninvasive imaging has demonstrated minimal if any atherosclerotic disease.  In this setting he has remained off statin therapy with recommendation for heart healthy diet.  This was not discussed at today's visit.  4. Vertigo/dizziness/fatigue/low-grade fevers: Uncertain etiology.  He has been referred to neurology by his oncology team at [facility].  With regards to his dizziness we will place a Zio patch to evaluate for arrhythmia, prolonged pause, or high grade AV block.  5. Gastroesophageal carcinoma/auditory canal lesions: Management per his oncology team.  Disposition: F/u with Dr. Rockey Situ or an APP in 2 to 3 months.   Medication Adjustments/Labs and Tests Ordered: Current medicines are reviewed at length with the patient today.  Concerns regarding medicines are outlined above. Medication changes, Labs and Tests ordered today are summarized above and listed in the Patient Instructions accessible in Encounters.   Signed, Christell Faith, PA-C 03/27/2020 10:51 AM     [facility] [address] [place] [place], [place] [address] [phone]

## 2020-03-27 ENCOUNTER — Other Ambulatory Visit: Payer: Self-pay

## 2020-03-27 ENCOUNTER — Ambulatory Visit: Payer: Medicare HMO | Admitting: Physician Assistant

## 2020-03-27 ENCOUNTER — Ambulatory Visit (INDEPENDENT_AMBULATORY_CARE_PROVIDER_SITE_OTHER): Payer: Medicare HMO

## 2020-03-27 ENCOUNTER — Encounter: Payer: Self-pay | Admitting: Physician Assistant

## 2020-03-27 VITALS — BP 140/92 | HR 68 | Ht 68.0 in | Wt 259.0 lb

## 2020-03-27 DIAGNOSIS — R002 Palpitations: Secondary | ICD-10-CM | POA: Diagnosis not present

## 2020-03-27 DIAGNOSIS — R509 Fever, unspecified: Secondary | ICD-10-CM

## 2020-03-27 DIAGNOSIS — R42 Dizziness and giddiness: Secondary | ICD-10-CM

## 2020-03-27 DIAGNOSIS — I10 Essential (primary) hypertension: Secondary | ICD-10-CM | POA: Diagnosis not present

## 2020-03-27 DIAGNOSIS — R5383 Other fatigue: Secondary | ICD-10-CM

## 2020-03-27 DIAGNOSIS — E782 Mixed hyperlipidemia: Secondary | ICD-10-CM | POA: Diagnosis not present

## 2020-03-27 DIAGNOSIS — I471 Supraventricular tachycardia, unspecified: Secondary | ICD-10-CM

## 2020-03-27 MED ORDER — AMLODIPINE BESYLATE 5 MG PO TABS
5.0000 mg | ORAL_TABLET | Freq: Every day | ORAL | 1 refills | Status: DC
Start: 2020-03-27 — End: 2020-12-26

## 2020-03-27 NOTE — Patient Instructions (Signed)
Medication Instructions:  Your physician has recommended you make the following change in your medication:  1- RESUME Amlodipine 5 mg by mouth once a day.  *If you need a refill on your cardiac medications before your next appointment, please call your pharmacy*  Lab Work: none If you have labs (blood work) drawn today and your tests are completely normal, you will receive your results only by: Marland Kitchen MyChart Message (if you have MyChart) OR . A paper copy in the mail If you have any lab test that is abnormal or we need to change your treatment, we will call you to review the results.  Testing/Procedures:  ZIO MONITOR FOR 14 DAYS Your physician has recommended that you wear a Zio monitor. This monitor is a medical device that records the heart's electrical activity. Doctors most often use these monitors to diagnose arrhythmias. Arrhythmias are problems with the speed or rhythm of the heartbeat. The monitor is a small device applied to your chest. You can wear one while you do your normal daily activities. While wearing this monitor if you have any symptoms to push the button and record what you felt. Once you have worn this monitor for the period of time provider prescribed (Usually 14 days), you will return the monitor device in the postage paid box. Once it is returned they will download the data collected and provide [place] with a report which the provider will then review and we will call you with those results. Important tips:  1. Avoid showering during the first 24 hours of wearing the monitor. 2. Avoid excessive sweating to help maximize wear time. 3. Do not submerge the device, no hot tubs, and no swimming pools. 4. Keep any lotions or oils away from the patch. 5. After 24 hours you may shower with the patch on. Take brief showers with your back facing the shower head.  6. Do not remove patch once it has been placed because that will interrupt data and decrease adhesive wear time. 7. Push the  button when you have any symptoms and write down what you were feeling. 8. Once you have completed wearing your monitor, remove and place into box which has postage paid and place in your outgoing mailbox.  9. If for some reason you have misplaced your box then call our office and we can provide another box and/or mail it off for you.   Follow-Up: At [facility], you and your health needs are our priority.  As part of our continuing mission to provide you with exceptional heart care, we have created designated Provider Care Teams.  These Care Teams include your primary Cardiologist (physician) and Advanced Practice Providers (APPs -  Physician Assistants and Nurse Practitioners) who all work together to provide you with the care you need, when you need it.  We recommend signing up for the patient portal called "MyChart".  Sign up information is provided on this After Visit Summary.  MyChart is used to connect with patients for Virtual Visits (Telemedicine).  Patients are able to view lab/test results, encounter notes, upcoming appointments, etc.  Non-urgent messages can be sent to your provider as well.   To learn more about what you can do with MyChart, go to [email or web address].    Your next appointment:   2-3 month(s)  The format for your next appointment:   In Person  Provider:   You may see Ida Rogue, MD or one of the following Advanced Practice Providers on your designated Care Team:  Murray Hodgkins, NP  Christell Faith, PA-C  Marrianne Mood, PA-C  Cadence Kathlen Mody, PA-C  Laurann Montana, NP

## 2020-04-17 DIAGNOSIS — G4452 New daily persistent headache (NDPH): Secondary | ICD-10-CM | POA: Diagnosis not present

## 2020-04-17 DIAGNOSIS — R69 Illness, unspecified: Secondary | ICD-10-CM | POA: Diagnosis not present

## 2020-04-18 DIAGNOSIS — R002 Palpitations: Secondary | ICD-10-CM | POA: Diagnosis not present

## 2020-05-02 ENCOUNTER — Other Ambulatory Visit: Payer: Self-pay

## 2020-05-02 ENCOUNTER — Ambulatory Visit: Payer: Self-pay

## 2020-05-02 ENCOUNTER — Ambulatory Visit
Admission: EM | Admit: 2020-05-02 | Discharge: 2020-05-02 | Disposition: A | Discharge status: Home / Self Care | Payer: Medicare HMO | Attending: Emergency Medicine | Admitting: Emergency Medicine

## 2020-05-02 ENCOUNTER — Encounter: Payer: Self-pay | Admitting: Emergency Medicine

## 2020-05-02 DIAGNOSIS — J069 Acute upper respiratory infection, unspecified: Secondary | ICD-10-CM | POA: Insufficient documentation

## 2020-05-02 DIAGNOSIS — Z0189 Encounter for other specified special examinations: Secondary | ICD-10-CM | POA: Diagnosis not present

## 2020-05-02 DIAGNOSIS — Z7689 Persons encountering health services in other specified circumstances: Secondary | ICD-10-CM | POA: Diagnosis not present

## 2020-05-02 DIAGNOSIS — J029 Acute pharyngitis, unspecified: Secondary | ICD-10-CM | POA: Diagnosis not present

## 2020-05-02 LAB — POCT RAPID STREP A (OFFICE): Rapid Strep A Screen: NEGATIVE

## 2020-05-02 MED ORDER — BENZONATATE 100 MG PO CAPS: 100.0000 mg | ORAL_CAPSULE | Freq: Three times a day (TID) | ORAL | 0 refills | Status: DC

## 2020-05-02 MED ORDER — AZITHROMYCIN 250 MG PO TABS: ORAL_TABLET | ORAL | 0 refills | Status: AC

## 2020-05-02 NOTE — Discharge Instructions (Addendum)
Push fluids to ensure adequate hydration and keep secretions thin.  Tylenol and/or ibuprofen as needed for pain or fevers.  Continue with your daily nasal spray.  Over the counter medications such as mucinex to help with symptoms.  Negative for strep throat today. Covid and flu testing are in process. We will call you if this is positive. If negative you will not hear from [place]. Unfortunately this may not result until next week, I do recommend isolating from others, particularly due to your fever.  Complete course of antibiotics.  Tessalon as needed for cough.  Please return for any worsening of symptoms.

## 2020-05-02 NOTE — ED Triage Notes (Signed)
Patient in office today c/o Congestion greenish mucus, fever(100.8), cough   WLN:[id number], Tylenol  Denies:diarrhea, nausea

## 2020-05-02 NOTE — ED Provider Notes (Signed)
Jose Hayes    CSN: [id number] Arrival date & time: 05/02/20  4665      History   Chief Complaint Chief Complaint  Patient presents with  . Head congestion  . Cough  . Fever    HPI Jose Hayes is a 59 y.o. male.   Jose Hayes presents with complaints of cough, congestion, right ear pain, sore throat, and temp up to 102. Symptoms started 3 days ago. He feels he has been worsening. No shortness of breath . Cough triggered emesis last night, no further nausea or vomiting. No diarrhea. No history of asthma or copd, previous smoker. History of tinnitus, this is currently worse than usual for him. He does use flonase daily. History of gastroesophageal cancer.     ROS per HPI, negative if not otherwise mentioned.      Past Medical History:  Diagnosis Date  . Allergy   . Esophageal cancer ([facility]) 10/21/2016   GE junction  . GERD (gastroesophageal reflux disease)   . Hyperglycemia   . Hyperlipidemia   . Hypertension   . SVT (supraventricular tachycardia) ([facility])    2013  . Tobacco abuse     Patient Active Problem List   Diagnosis Date Noted  . Sinusitis 07/15/2019  . Gastroesophageal cancer ([facility]) 11/02/2016  . Kidney stones 10/13/2016  . Restless leg syndrome 09/04/2015  . Hyperlipidemia 12/01/2011  . SVT (supraventricular tachycardia) ([facility]) 12/01/2011  . Obesity 12/01/2011  . Hypertension 12/01/2011    Past Surgical History:  Procedure Laterality Date  . COLONOSCOPY    . LAPAROSCOPIC CHOLECYSTECTOMY  1991  . SHOULDER SURGERY Right 1985, 1991, 2000   left x3  . swidom teeth extraction    . UPPER GASTROINTESTINAL ENDOSCOPY         Home Medications    Prior to Admission medications   Medication Sig Start Date End Date Taking? Authorizing Provider  ALPRAZolam Prudy Feeler) 1 MG tablet Take 1 mg by mouth 3 (three) times daily as needed for anxiety.    [provider]  amLODipine (NORVASC) 5 MG tablet Take 1 tablet (5 mg total) by  mouth daily. 03/27/20   Dunn, Raymon Mutton, PA-C  azithromycin (ZITHROMAX) 250 MG tablet Take 2 tablets (500 mg total) by mouth daily for 1 day, THEN 1 tablet (250 mg total) daily for 4 days. 05/02/20 05/07/20  Georgetta Haber, NP  benzonatate (TESSALON) 100 MG capsule Take 1 capsule (100 mg total) by mouth every 8 (eight) hours. 05/02/20   Georgetta Haber, NP  escitalopram (LEXAPRO) 10 MG tablet Take 10 mg by mouth daily. 02/05/20   [provider]  fluticasone (FLONASE) 50 MCG/ACT nasal spray Place 2 sprays into both nostrils daily. 07/27/19   Glori Luis, MD  furosemide (LASIX) 20 MG tablet Take 1 tablet (20 mg) by mouth once daily as needed for swelling/ increased weight/ or shortness of breath 09/20/17   Antonieta Iba, MD  HYDROcodone-acetaminophen (NORCO/VICODIN) 5-325 MG tablet Take by mouth.    [provider]  hydrocortisone (CORTEF) 10 MG tablet Take 10 mg by mouth daily.    [provider]  levothyroxine (SYNTHROID) 88 MCG tablet Take 88 mcg by mouth daily before breakfast.    [provider]  levothyroxine (SYNTHROID, LEVOTHROID) 75 MCG tablet Take 75 mcg by mouth daily before breakfast.  09/09/17 09/09/18  [provider]  LORazepam (ATIVAN) 1 MG tablet Take 1 mg by mouth 3 (three) times daily. 02/05/20   [provider]  testosterone cypionate (DEPOTESTOSTERONE CYPIONATE) 200 MG/ML injection Inject into the muscle. 03/26/20 04/25/20  [provider]  zolpidem (AMBIEN CR) 6.25 MG CR tablet Take 6.25 mg by mouth as needed. 12/10/16 09/20/17  [provider]    Family History Family History  Problem Relation Age of Onset  . Hypertension Mother   . Hypertension Father   . Pancreatic cancer Father   . Clotting disorder Father        renal cell ca  . Colon cancer Neg Hx   . Esophageal cancer Neg Hx   . Prostate cancer Neg Hx   . Rectal cancer Neg Hx   . Stomach cancer Neg Hx     Social History Social History    Tobacco Use  . Smoking status: Former Smoker    Packs/day: 0.50    Years: 25.00    Pack years: 12.50    Types: Cigarettes  . Smokeless tobacco: Never Used  . Tobacco comment: trying gum to quit  Vaping Use  . Vaping Use: Some days  Substance Use Topics  . Alcohol use: Yes    Comment: occasionally  . Drug use: No     Allergies   Patient has no known allergies.   Review of Systems Review of Systems   Physical Exam Triage Vital Signs ED Triage Vitals  Enc Vitals Group     BP 05/02/20 0901 137/78     Pulse Rate 05/02/20 0901 89     Resp 05/02/20 0901 18     Temp 05/02/20 0901 99.3 F (37.4 C)     Temp Source 05/02/20 0901 Oral     SpO2 05/02/20 0901 96 %     Weight 05/02/20 0902 245 lb (111.1 kg)     Height 05/02/20 0902 5\' 9"  (1.753 m)     Head Circumference --      Peak Flow --      Pain Score 05/02/20 0902 9     Pain Loc --      Pain Edu? --      Excl. in [place]? --    No data found.  Updated Vital Signs BP 137/78 (BP Location: Left Arm)   Pulse 89   Temp 99.3 F (37.4 C) (Oral)   Resp 18   Ht 5\' 9"  (1.753 m)   Wt 245 lb (111.1 kg)   SpO2 96%   BMI 36.18 kg/m   Visual Acuity Right Eye Distance:   Left Eye Distance:   Bilateral Distance:    Right Eye Near:   Left Eye Near:    Bilateral Near:     Physical Exam Constitutional:      Appearance: He is well-developed.  HENT:     Right Ear: Ear canal normal. Tympanic membrane is injected.     Left Ear: Ear canal normal. Tympanic membrane is scarred.     Nose: Congestion and rhinorrhea present.     Mouth/Throat:     Mouth: Mucous membranes are moist.  Cardiovascular:     Rate and Rhythm: Normal rate and regular rhythm.  Pulmonary:     Effort: Pulmonary effort is normal.     Breath sounds: Normal breath sounds.  Skin:    General: Skin is warm and dry.  Neurological:     Mental Status: He is alert and oriented to person, place, and time.      UC Treatments / Results  Labs (all labs  ordered are listed, but only abnormal results are displayed) Labs  Reviewed  COVID-19, FLU A+B NAA  CULTURE, GROUP A STREP [facility])  POCT RAPID STREP A (OFFICE)    EKG   Radiology No results found.  Procedures Procedures (including critical care time)  Medications Ordered in UC Medications - No data to display  Initial Impression / Assessment and Plan / UC Course  I have reviewed the triage vital signs and the nursing notes.  Pertinent labs & imaging results that were available during my care of the patient were reviewed by me and considered in my medical decision making (see chart for details).     Worsening of uri symptoms with newer onset of fever. Viral panel pending for covid and influenza. Azithromycin provided empirically. Isolation discussed and recommended. Return precautions provided. Patient verbalized understanding and agreeable to plan. Ambulatory out of clinic without difficulty.    Final Clinical Impressions(s) / UC Diagnoses   Final diagnoses:  Patient request for diagnostic testing  Sorethroat  Upper respiratory tract infection, unspecified type     Discharge Instructions     Push fluids to ensure adequate hydration and keep secretions thin.  Tylenol and/or ibuprofen as needed for pain or fevers.  Continue with your daily nasal spray.  Over the counter medications such as mucinex to help with symptoms.  Negative for strep throat today. Covid and flu testing are in process. We will call you if this is positive. If negative you will not hear from [place]. Unfortunately this may not result until next week, I do recommend isolating from others, particularly due to your fever.  Complete course of antibiotics.  Tessalon as needed for cough.  Please return for any worsening of symptoms.    ED Prescriptions    Medication Sig Dispense Auth. Provider   azithromycin (ZITHROMAX) 250 MG tablet Take 2 tablets (500 mg total) by mouth daily for 1 day, THEN 1 tablet (250 mg  total) daily for 4 days. 6 tablet Augusto Gamble B, NP   benzonatate (TESSALON) 100 MG capsule Take 1 capsule (100 mg total) by mouth every 8 (eight) hours. 21 capsule Zigmund Gottron, NP     PDMP not reviewed this encounter.   Zigmund Gottron, NP 05/02/20 [phone]

## 2020-05-05 LAB — CULTURE, GROUP A STREP (THRC)

## 2020-05-05 LAB — COVID-19, FLU A+B NAA
Influenza A, NAA: NOT DETECTED
Influenza B, NAA: NOT DETECTED
SARS-CoV-2, NAA: NOT DETECTED

## 2020-05-06 ENCOUNTER — Telehealth: Payer: Self-pay | Admitting: *Deleted

## 2020-05-06 NOTE — Telephone Encounter (Signed)
-----   Message from Creig Hines, NP sent at 05/04/2020  5:06 PM EST ----- 1 run of SVT - fast HR stemming from top chambers, which was very brief.  He did trigger the device during this event.  He had 10 other triggered events that were not associated w/ any significant arrhythmia.  Overall reassuring.

## 2020-05-06 NOTE — Telephone Encounter (Signed)
Spoke with patient and reviewed results of his monitor. He verbalized understanding and reports that during that time he had a good 2 weeks so no symptoms of significance during that time. Reviewed that Dr. Mariah Milling can review at his upcoming appointment as well and determine plan from that. He verbalized understanding of our conversation with no further questions at this time.

## 2020-05-06 NOTE — Telephone Encounter (Signed)
Patient is returning your call.  

## 2020-05-06 NOTE — Telephone Encounter (Signed)
Left voicemail message to call back for results.  

## 2020-05-13 DIAGNOSIS — R69 Illness, unspecified: Secondary | ICD-10-CM | POA: Diagnosis not present

## 2020-05-22 ENCOUNTER — Ambulatory Visit (INDEPENDENT_AMBULATORY_CARE_PROVIDER_SITE_OTHER): Payer: Medicare HMO

## 2020-05-22 VITALS — Ht 69.0 in | Wt 245.0 lb

## 2020-05-22 DIAGNOSIS — Z0000 Encounter for general adult medical examination without abnormal findings: Secondary | ICD-10-CM

## 2020-05-22 NOTE — Progress Notes (Signed)
Subjective:   Jose Hayes is a 60 y.o. male who presents for an Initial Medicare Annual Wellness Visit.  Review of Systems    No ROS.  Medicare Wellness Virtual Visit.   Cardiac Risk Factors include: advanced age (>37men, >57 women);male gender;smoking/ tobacco exposure     Objective:    Today's Vitals   05/22/20 1107  Weight: 245 lb (111.1 kg)  Height: 5\' 9"  (1.753 m)   Body mass index is 36.18 kg/m.  Advanced Directives 05/22/2020 11/02/2016 10/27/2016 02/10/2015 12/07/2013 11/23/2013  Does Patient Have a Medical Advance Directive? Yes No No No Patient does not have advance directive Patient does not have advance directive  Does patient want to make changes to medical advance directive? No - Patient declined - - - - -  Would patient like information on creating a medical advance directive? - - - No - patient declined information - -    Current Medications (verified) Outpatient Encounter Medications as of 05/22/2020  Medication Sig  . ALPRAZolam (XANAX) 1 MG tablet Take 1 mg by mouth 3 (three) times daily as needed for anxiety.  Marland Kitchen amLODipine (NORVASC) 5 MG tablet Take 1 tablet (5 mg total) by mouth daily.  . benzonatate (TESSALON) 100 MG capsule Take 1 capsule (100 mg total) by mouth every 8 (eight) hours.  Marland Kitchen escitalopram (LEXAPRO) 10 MG tablet Take 10 mg by mouth daily.  . fluticasone (FLONASE) 50 MCG/ACT nasal spray Place 2 sprays into both nostrils daily.  . furosemide (LASIX) 20 MG tablet Take 1 tablet (20 mg) by mouth once daily as needed for swelling/ increased weight/ or shortness of breath  . HYDROcodone-acetaminophen (NORCO/VICODIN) 5-325 MG tablet Take by mouth.  . hydrocortisone (CORTEF) 10 MG tablet Take 10 mg by mouth daily.  Marland Kitchen levothyroxine (SYNTHROID) 88 MCG tablet Take 88 mcg by mouth daily before breakfast.  . levothyroxine (SYNTHROID, LEVOTHROID) 75 MCG tablet Take 75 mcg by mouth daily before breakfast.   . LORazepam (ATIVAN) 1 MG tablet Take 1 mg by  mouth 3 (three) times daily.  Marland Kitchen testosterone cypionate (DEPOTESTOSTERONE CYPIONATE) 200 MG/ML injection Inject into the muscle.  . zolpidem (AMBIEN CR) 6.25 MG CR tablet Take 6.25 mg by mouth as needed.   Facility-Administered Encounter Medications as of 05/22/2020  Medication  . 0.9 %  sodium chloride infusion    Allergies (verified) Patient has no known allergies.   History: Past Medical History:  Diagnosis Date  . Allergy   . Esophageal cancer ([place]) 10/21/2016   GE junction  . GERD (gastroesophageal reflux disease)   . Hyperglycemia   . Hyperlipidemia   . Hypertension   . SVT (supraventricular tachycardia) ([place])    2013  . Tobacco abuse    Past Surgical History:  Procedure Laterality Date  . COLONOSCOPY    . LAPAROSCOPIC CHOLECYSTECTOMY  1991  . SHOULDER SURGERY Right 1985, 1991, 2000   left x3  . [place] teeth extraction    . UPPER GASTROINTESTINAL ENDOSCOPY     Family History  Problem Relation Age of Onset  . Hypertension Mother   . Hypertension Father   . Pancreatic cancer Father   . Clotting disorder Father        renal cell ca  . Colon cancer Neg Hx   . Esophageal cancer Neg Hx   . Prostate cancer Neg Hx   . Rectal cancer Neg Hx   . Stomach cancer Neg Hx    Social History   Socioeconomic History  . Marital  status: Married    Spouse name: Not on file  . Number of children: Not on file  . Years of education: Not on file  . Highest education level: Not on file  Occupational History  . Not on file  Tobacco Use  . Smoking status: Former Smoker    Packs/day: 0.50    Years: 25.00    Pack years: 12.50    Types: Cigarettes  . Smokeless tobacco: Never Used  . Tobacco comment: trying gum to quit  Vaping Use  . Vaping Use: Some days  Substance and Sexual Activity  . Alcohol use: Yes    Comment: occasionally  . Drug use: No  . Sexual activity: Not on file  Other Topics Concern  . Not on file  Social History Narrative  . Not on file   Social  Determinants of Health   Financial Resource Strain: Low Risk   . Difficulty of Paying Living Expenses: Not very hard  Food Insecurity: No Food Insecurity  . Worried About Programme researcher, broadcasting/film/video in the Last Year: Never true  . Ran Out of Food in the Last Year: Never true  Transportation Needs: No Transportation Needs  . Lack of Transportation (Medical): No  . Lack of Transportation (Non-Medical): No  Physical Activity: Not on file  Stress: No Stress Concern Present  . Feeling of Stress : Only a little  Social Connections: Unknown  . Frequency of Communication with Friends and Family: Not on file  . Frequency of Social Gatherings with Friends and Family: Not on file  . Attends Religious Services: Not on file  . Active Member of Clubs or Organizations: Not on file  . Attends Banker Meetings: Not on file  . Marital Status: Married    Tobacco Counseling Counseling given: Not Answered Comment: trying gum to quit   Clinical Intake:  Pre-visit preparation completed: Yes        Diabetes: No  How often do you need to have someone help you when you read instructions, pamphlets, or other written materials from your doctor or pharmacy?: 1 - Never    Interpreter Needed?: No      Activities of Daily Living In your present state of health, do you have any difficulty performing the following activities: 05/22/2020  Hearing? N  Vision? N  Difficulty concentrating or making decisions? N  Walking or climbing stairs? N  Dressing or bathing? N  Doing errands, shopping? N  Preparing Food and eating ? N  Using the Toilet? N  In the past six months, have you accidently leaked urine? N  Do you have problems with loss of bowel control? N  Managing your Medications? N  Managing your Finances? N  Housekeeping or managing your Housekeeping? N  Some recent data might be hidden    Patient Care Team: Glori Luis, MD as PCP - General (Family Medicine) Mariah Milling, [name] Pizza, MD as PCP - Cardiology (Cardiology) Antonieta Iba, MD as Consulting Physician (Cardiology)  Indicate any recent Medical Services you may have received from other than [facility] providers in the past year (date may be approximate).     Assessment:   This is a routine wellness examination for Yordi.  I connected with Shadow today by telephone and verified that I am speaking with the correct person using two identifiers. Location patient: home Location provider: work Persons participating in the virtual visit: patient, Engineer, civil (consulting).    I discussed the limitations, risks, security and privacy concerns of  performing an evaluation and management service by telephone and the availability of in person appointments. The patient expressed understanding and verbally consented to this telephonic visit.    Interactive audio and video telecommunications were attempted between this provider and patient, however failed, due to patient having technical difficulties OR patient did not have access to video capability.  We continued and completed visit with audio only.  Some vital signs may be absent or patient reported.   Hearing/Vision screen  Hearing Screening   125Hz  250Hz  500Hz  1000Hz  2000Hz  3000Hz  4000Hz  6000Hz  8000Hz   Right ear:           Left ear:           Comments: Followed by [facility] Hearing loss No hearing aids in use  Vision Screening Comments: Wears readers Visual acuity not assessed, virtual visit.  They have seen their ophthalmologist in the last 12 months.     Dietary issues and exercise activities discussed: Current Exercise Habits: Home exercise routine, Type of exercise: walking;calisthenics (Row machine), Time (Minutes): 10, Frequency (Times/Week): 7, Weekly Exercise (Minutes/Week): 70, Intensity: Mild  Regular diet Good water intake  Goals    . Follow up with pcp as needed      Depression Screen PHQ 2/9 Scores 05/22/2020 07/13/2019 01/09/2019 12/22/2016  PHQ - 2 Score -  0 0 0  PHQ- 9 Score - - 0 -  Exception Documentation Other- indicate reason in comment box - - -    Fall Risk Fall Risk  05/22/2020 07/13/2019 11/02/2016  Falls in the past year? 0 0 No  Number falls in past yr: 0 0 -  Injury with Fall? 0 - -  Follow up Falls evaluation completed Falls evaluation completed -    FALL RISK PREVENTION PERTAINING TO THE HOME: Handrails in use when climbing stairs? Yes Home free of loose throw rugs in walkways, pet beds, electrical cords, etc? Yes  Adequate lighting in your home to reduce risk of falls? Yes   ASSISTIVE DEVICES UTILIZED TO PREVENT FALLS: Life alert? Yes  Use of a cane, walker or w/c? No     TIMED UP AND GO: Was the test performed? No . Virtual visit.   Cognitive Function:  Patient is alert and oriented x3.  MMSE/6CIT deferred. Normal by direct communication/observation.      Immunizations Immunization History  Administered Date(s) Administered  . Influenza,inj,Quad PF,6+ Mos 02/03/2017, 02/13/2018, 01/11/2019  . PFIZER SARS-COV-2 Vaccination 08/06/2019, 08/27/2019    TDAP status: Due, Education has been provided regarding the importance of this vaccine. Advised may receive this vaccine at local pharmacy or Health Dept. Aware to provide a copy of the vaccination record if obtained from local pharmacy or Health Dept. Verbalized acceptance and understanding.  Deferred.   Health Maintenance Health Maintenance  Topic Date Due  . HIV Screening  Never done  . TETANUS/TDAP  Never done  . COLONOSCOPY (Pts 45-73yrs Insurance coverage will need to be confirmed)  12/08/2018  . COVID-19 Vaccine (3 - Pfizer risk 4-dose series) 09/24/2019  . INFLUENZA VACCINE  12/09/2019  . Hepatitis C Screening  05/22/2021 (Originally [birth year])   Colorectal cancer screening: Type of screening: Colonoscopy. Completed 12/07/13. Repeat every 5 years. Scans completed every 4 months.   Hepatitis C Screening: deferred.   Vision Screening: Recommended annual  ophthalmology exams for early detection of glaucoma and other disorders of the eye. Is the patient up to date with their annual eye exam?  Yes   Dental Screening: Recommended annual dental  exams for proper oral hygiene.  Community Resource Referral / Chronic Care Management: CRR required this visit?  No   CCM required this visit?  No      Plan:   Keep all routine maintenance appointments.   I have personally reviewed and noted the following in the patient's chart:   . Medical and social history . Use of alcohol, tobacco or illicit drugs  . Current medications and supplements . Functional ability and status . Nutritional status . Physical activity . Advanced directives . List of other physicians . Hospitalizations, surgeries, and ER visits in previous 12 months . Vitals . Screenings to include cognitive, depression, and falls . Referrals and appointments  In addition, I have reviewed and discussed with patient certain preventive protocols, quality metrics, and best practice recommendations. A written personalized care plan for preventive services as well as general preventive health recommendations were provided to patient via mychart.     Ashok Pall, LPN   08/03/7122

## 2020-05-22 NOTE — Patient Instructions (Addendum)
Jose Hayes , Thank you for taking time to come for your Medicare Wellness Visit. I appreciate your ongoing commitment to your health goals. Please review the following plan we discussed and let me know if I can assist you in the future.   These are the goals we discussed: Goals    . Follow up with pcp as needed       This is a list of the screening recommended for you and due dates:  Health Maintenance  Topic Date Due  . HIV Screening  Never done  . Tetanus Vaccine  Never done  . Colon Cancer Screening  12/08/2018  . COVID-19 Vaccine (3 - Pfizer risk 4-dose series) 09/24/2019  . Flu Shot  12/09/2019  .  Hepatitis C: One time screening is recommended by Center for Disease Control  (CDC) for  adults born from [birth year] through 1965.   05/22/2021*  *Topic was postponed. The date shown is not the original due date.    Immunizations Immunization History  Administered Date(s) Administered  . Influenza,inj,Quad PF,6+ Mos 02/03/2017, 02/13/2018, 01/11/2019  . PFIZER SARS-COV-2 Vaccination 08/06/2019, 08/27/2019   Advanced directives: End of life planning; Advance aging; Advanced directives discussed.  Copy of current HCPOA/Living Will requested.    Conditions/risks identified: none new  Follow up in one year for your annual wellness visit   Preventive Care 40-64 Years, Male Preventive care refers to lifestyle choices and visits with your health care provider that can promote health and wellness. What does preventive care include?  A yearly physical exam. This is also called an annual well check.  Dental exams once or twice a year.  Routine eye exams. Ask your health care provider how often you should have your eyes checked.  Personal lifestyle choices, including:  Daily care of your teeth and gums.  Regular physical activity.  Eating a healthy diet.  Avoiding tobacco and drug use.  Limiting alcohol use.  Practicing safe sex.  Taking low-dose aspirin every day starting  at age 22. What happens during an annual well check? The services and screenings done by your health care provider during your annual well check will depend on your age, overall health, lifestyle risk factors, and family history of disease. Counseling  Your health care provider may ask you questions about your:  Alcohol use.  Tobacco use.  Drug use.  Emotional well-being.  Home and relationship well-being.  Sexual activity.  Eating habits.  Work and work Statistician. Screening  You may have the following tests or measurements:  Height, weight, and BMI.  Blood pressure.  Lipid and cholesterol levels. These may be checked every 5 years, or more frequently if you are over 42 years old.  Skin check.  Lung cancer screening. You may have this screening every year starting at age 36 if you have a 30-pack-year history of smoking and currently smoke or have quit within the past 15 years.  Fecal occult blood test (FOBT) of the stool. You may have this test every year starting at age 46.  Flexible sigmoidoscopy or colonoscopy. You may have a sigmoidoscopy every 5 years or a colonoscopy every 10 years starting at age 43.  Prostate cancer screening. Recommendations will vary depending on your family history and other risks.  Hepatitis C blood test.  Hepatitis B blood test.  Sexually transmitted disease (STD) testing.  Diabetes screening. This is done by checking your blood sugar (glucose) after you have not eaten for a while (fasting). You may have this done  every 1-3 years. Discuss your test results, treatment options, and if necessary, the need for more tests with your health care provider. Vaccines  Your health care provider may recommend certain vaccines, such as:  Influenza vaccine. This is recommended every year.  Tetanus, diphtheria, and acellular pertussis (Tdap, Td) vaccine. You may need a Td booster every 10 years.  Zoster vaccine. You may need this after age  55.  Pneumococcal 13-valent conjugate (PCV13) vaccine. You may need this if you have certain conditions and have not been vaccinated.  Pneumococcal polysaccharide (PPSV23) vaccine. You may need one or two doses if you smoke cigarettes or if you have certain conditions. Talk to your health care provider about which screenings and vaccines you need and how often you need them. This information is not intended to replace advice given to you by your health care provider. Make sure you discuss any questions you have with your health care provider. Document Released: 05/23/2015 Document Revised: 01/14/2016 Document Reviewed: 02/25/2015 Elsevier Interactive Patient Education  2017 [place] Prevention in the Home Falls can cause injuries. They can happen to people of all ages. There are many things you can do to make your home safe and to help prevent falls. What can I do on the outside of my home?  Regularly fix the edges of walkways and driveways and fix any cracks.  Remove anything that might make you trip as you walk through a door, such as a raised step or threshold.  Trim any bushes or trees on the path to your home.  Use bright outdoor lighting.  Clear any walking paths of anything that might make someone trip, such as rocks or tools.  Regularly check to see if handrails are loose or broken. Make sure that both sides of any steps have handrails.  Any raised decks and porches should have guardrails on the edges.  Have any leaves, snow, or ice cleared regularly.  Use sand or salt on walking paths during winter.  Clean up any spills in your garage right away. This includes oil or grease spills. What can I do in the bathroom?  Use night lights.  Install grab bars by the toilet and in the tub and shower. Do not use towel bars as grab bars.  Use non-skid mats or decals in the tub or shower.  If you need to sit down in the shower, use a plastic, non-slip stool.  Keep  the floor dry. Clean up any water that spills on the floor as soon as it happens.  Remove soap buildup in the tub or shower regularly.  Attach bath mats securely with double-sided non-slip rug tape.  Do not have throw rugs and other things on the floor that can make you trip. What can I do in the bedroom?  Use night lights.  Make sure that you have a light by your bed that is easy to reach.  Do not use any sheets or blankets that are too big for your bed. They should not hang down onto the floor.  Have a firm chair that has side arms. You can use this for support while you get dressed.  Do not have throw rugs and other things on the floor that can make you trip. What can I do in the kitchen?  Clean up any spills right away.  Avoid walking on wet floors.  Keep items that you use a lot in easy-to-reach places.  If you need to reach something above you,  use a strong step stool that has a grab bar.  Keep electrical cords out of the way.  Do not use floor polish or wax that makes floors slippery. If you must use wax, use non-skid floor wax.  Do not have throw rugs and other things on the floor that can make you trip. What can I do with my stairs?  Do not leave any items on the stairs.  Make sure that there are handrails on both sides of the stairs and use them. Fix handrails that are broken or loose. Make sure that handrails are as long as the stairways.  Check any carpeting to make sure that it is firmly attached to the stairs. Fix any carpet that is loose or worn.  Avoid having throw rugs at the top or bottom of the stairs. If you do have throw rugs, attach them to the floor with carpet tape.  Make sure that you have a light switch at the top of the stairs and the bottom of the stairs. If you do not have them, ask someone to add them for you. What else can I do to help prevent falls?  Wear shoes that:  Do not have high heels.  Have rubber bottoms.  Are comfortable and  fit you well.  Are closed at the toe. Do not wear sandals.  If you use a stepladder:  Make sure that it is fully opened. Do not climb a closed stepladder.  Make sure that both sides of the stepladder are locked into place.  Ask someone to hold it for you, if possible.  Clearly mark and make sure that you can see:  Any grab bars or handrails.  First and last steps.  Where the edge of each step is.  Use tools that help you move around (mobility aids) if they are needed. These include:  Canes.  Walkers.  Scooters.  Crutches.  Turn on the lights when you go into a dark area. Replace any light bulbs as soon as they burn out.  Set up your furniture so you have a clear path. Avoid moving your furniture around.  If any of your floors are uneven, fix them.  If there are any pets around you, be aware of where they are.  Review your medicines with your doctor. Some medicines can make you feel dizzy. This can increase your chance of falling. Ask your doctor what other things that you can do to help prevent falls. This information is not intended to replace advice given to you by your health care provider. Make sure you discuss any questions you have with your health care provider. Document Released: 02/20/2009 Document Revised: 10/02/2015 Document Reviewed: 05/31/2014 Elsevier Interactive Patient Education  2017 [employer].

## 2020-05-23 ENCOUNTER — Telehealth: Payer: Self-pay | Admitting: Family Medicine

## 2020-05-23 DIAGNOSIS — U071 COVID-19: Secondary | ICD-10-CM

## 2020-05-23 NOTE — Telephone Encounter (Signed)
I called and spoke with the patient and he woould like the infusion, I also explained the quarantine guidelines and he understood.   Annalysa Mohammad,cma

## 2020-05-23 NOTE — Telephone Encounter (Signed)
Referral placed.

## 2020-05-23 NOTE — Telephone Encounter (Signed)
Pt tested positive for Covid yesterday morning  He is having a low grade fever and head congestion wanted to know what he needs to take

## 2020-05-23 NOTE — Addendum Note (Signed)
Addended by: Caryl Bis Nakya Weyand G on: 05/23/2020 04:50 PM   Modules accepted: Orders

## 2020-05-23 NOTE — Telephone Encounter (Signed)
He can take Tylenol for the headache and fever.  He could also try adding Zyrtec or Allegra and continuing use of Flonase for the congestion.  Given his medical history we could refer him for the monoclonal antibody infusion if he is willing.  If he develops any shortness of breath, chest pain, or fevers of 103 degrees or higher he needs to be evaluated immediately in person.  He needs to remain quarantined at home for at least 5 days from symptom onset and if his symptoms have resolved after 5 days he can come off of quarantine and wear a mask when he is around anybody else though if he is continuing to have symptoms at day five he should remain quarantined for at least 10 days and then could come off of quarantine once his symptoms resolve.

## 2020-05-23 NOTE — Telephone Encounter (Signed)
Pt tested positive for Covid yesterday morning  He is having a low grade fever and head congestion wanted to know what he needs to take Corina Stacy,cma

## 2020-05-24 ENCOUNTER — Other Ambulatory Visit: Payer: Self-pay

## 2020-05-24 ENCOUNTER — Other Ambulatory Visit: Payer: Medicare HMO

## 2020-05-24 DIAGNOSIS — Z20822 Contact with and (suspected) exposure to covid-19: Secondary | ICD-10-CM

## 2020-05-26 ENCOUNTER — Ambulatory Visit: Payer: Medicare HMO | Admitting: Family Medicine

## 2020-05-27 LAB — NOVEL CORONAVIRUS, NAA: SARS-CoV-2, NAA: DETECTED — AB

## 2020-05-28 NOTE — Telephone Encounter (Signed)
Pt states that he never heard about getting an infusion. He states that his symptoms are worsening and he feels like his cold is moving to his chest. I put him in for VV on Friday with Dr Caryl Bis but he wants to know any suggestions before appt. Please advise

## 2020-05-28 NOTE — Telephone Encounter (Addendum)
Patient has had symptoms since Friday, but feels like its not improving and feels the drainage is moving to chest. Denies shortness of breath just  A lot of nasal congestion he would like to know any medications he can use prior to visit? With PCP on Friday,

## 2020-05-28 NOTE — Telephone Encounter (Signed)
He can use Mucinex, Flonase, and Zyrtec for symptoms.  If he develops shortness of breath, chest pain, or fever of 103 F or higher he needs to be seen in person.  If he has progressive symptoms prior to our virtual visit on Friday he should go to urgent care.  He likely did not meet criteria for the antibody infusion and that likely why he never heard anything.

## 2020-05-28 NOTE — Telephone Encounter (Signed)
Pt states that he never heard about getting an infusion. He states that his symptoms are worsening and he feels like his cold is moving to his chest. I put him in for VV on Friday with Dr Caryl Bis but he wants to know any suggestions before appt. Please advise.  Keala Drum,cma

## 2020-05-28 NOTE — Telephone Encounter (Signed)
Patient will follow PCP advice if symptoms worsen to the degree specified he will go to UC.

## 2020-05-30 ENCOUNTER — Other Ambulatory Visit: Payer: Self-pay

## 2020-05-30 ENCOUNTER — Encounter: Payer: Self-pay | Admitting: Internal Medicine

## 2020-05-30 ENCOUNTER — Ambulatory Visit
Admission: RE | Admit: 2020-05-30 | Discharge: 2020-05-30 | Disposition: A | Discharge status: Home / Self Care | Payer: Medicare HMO | Source: Ambulatory Visit | Attending: Internal Medicine | Admitting: Internal Medicine

## 2020-05-30 ENCOUNTER — Telehealth (INDEPENDENT_AMBULATORY_CARE_PROVIDER_SITE_OTHER): Payer: Medicare HMO | Admitting: Internal Medicine

## 2020-05-30 ENCOUNTER — Telehealth: Payer: Self-pay | Admitting: Internal Medicine

## 2020-05-30 ENCOUNTER — Telehealth: Payer: Medicare HMO | Admitting: Family Medicine

## 2020-05-30 VITALS — HR 67 | Ht 69.0 in | Wt 235.0 lb

## 2020-05-30 DIAGNOSIS — U071 COVID-19: Secondary | ICD-10-CM | POA: Diagnosis not present

## 2020-05-30 DIAGNOSIS — F39 Unspecified mood [affective] disorder: Secondary | ICD-10-CM | POA: Insufficient documentation

## 2020-05-30 DIAGNOSIS — J40 Bronchitis, not specified as acute or chronic: Secondary | ICD-10-CM | POA: Diagnosis not present

## 2020-05-30 DIAGNOSIS — F419 Anxiety disorder, unspecified: Secondary | ICD-10-CM | POA: Insufficient documentation

## 2020-05-30 DIAGNOSIS — J029 Acute pharyngitis, unspecified: Secondary | ICD-10-CM

## 2020-05-30 DIAGNOSIS — R69 Illness, unspecified: Secondary | ICD-10-CM | POA: Diagnosis not present

## 2020-05-30 DIAGNOSIS — E2740 Unspecified adrenocortical insufficiency: Secondary | ICD-10-CM | POA: Insufficient documentation

## 2020-05-30 DIAGNOSIS — E039 Hypothyroidism, unspecified: Secondary | ICD-10-CM | POA: Diagnosis not present

## 2020-05-30 DIAGNOSIS — E7800 Pure hypercholesterolemia, unspecified: Secondary | ICD-10-CM

## 2020-05-30 DIAGNOSIS — R509 Fever, unspecified: Secondary | ICD-10-CM | POA: Diagnosis not present

## 2020-05-30 DIAGNOSIS — R059 Cough, unspecified: Secondary | ICD-10-CM | POA: Diagnosis not present

## 2020-05-30 MED ORDER — METHYLPREDNISOLONE 4 MG PO TBPK: ORAL_TABLET | ORAL | 0 refills | Status: DC

## 2020-05-30 MED ORDER — AZITHROMYCIN 250 MG PO TABS: ORAL_TABLET | ORAL | 0 refills | Status: DC

## 2020-05-30 MED ORDER — HYDROCOD POLST-CPM POLST ER 10-8 MG/5ML PO SUER: 5.0000 mL | Freq: Every evening | ORAL | 0 refills | Status: DC | PRN

## 2020-05-30 MED ORDER — ALBUTEROL SULFATE HFA 108 (90 BASE) MCG/ACT IN AERS: 2.0000 | INHALATION_SPRAY | Freq: Four times a day (QID) | RESPIRATORY_TRACT | 0 refills | Status: DC | PRN

## 2020-05-30 NOTE — Telephone Encounter (Signed)
Will they call him and let him know what time and everything?  Elois Averitt,cma

## 2020-05-30 NOTE — Patient Instructions (Signed)
There is no medication other than over the counter meds:  Mucinex dm green label for cough.  Vitamin C 1000 mg daily.  Vitamin D3 4000 Iu (units) daily.  Zinc 100 mg daily.  Quercetin 250-500 mg 2 times per day   Elderberry  Oil of oregano  cepacol or chloroseptic spray  Warm tea with honey and lemon  Hydration  Try to eat though you dont feel like it   Tylenol or Advil  Nasal saline  Flonase  Runny nose/sneezing over the counter allergy pill zyrtec, xyzal, allegra or claritin Monitor pulse oximeter, buy from [place] if oxygen is less than 90 please go to the hospital.        Are you feeling really sick? Shortness of breath, cough, chest pain?, dizziness? Confusion   If so let me know  If worsening, go to hospital or [facility] clinic Urgent care for further treatment

## 2020-05-30 NOTE — Progress Notes (Addendum)
Virtual Visit via Video Note  I connected with Jose Hayes   on 05/30/20 at  8:30 AM EST by a video enabled telemedicine application and verified that I am speaking with the correct person using two identifiers.  Location patient: home, [place] Location provider:work or home office Persons participating in the virtual visit: patient, provider pts wife   I discussed the limitations of evaluation and management by telemedicine and the availability of in person appointments. The patient expressed understanding and agreed to proceed.   HPI: covid 61 + 1/14 home test and 1/15 pcr with h/o adrenal insufficiency, esophageal cancer presents with h/a congestion, sinus congestion (last night improved) sore throat, cough, chest congestion, fever low grade 99 to <100, sob, body aches, he takes hydrocortisone daily orally and is on the sick day protocol with [facility] currently for AI. Tried Mucinex zyrtec and flonase and helped last night with sinus sx's but still having sore throat 6/10 and chest congestion cough.  Infusion line contacted 2 x already and no return call to patient  He is about 50% better out of 100% still not feeling well  He has tried prednisone in the past with increased anxiety but done medrol dose pk in the past and tolerated    ROS: See pertinent positives and negatives per HPI.  Past Medical History:  Diagnosis Date   Allergy    Esophageal cancer ([place]) 10/21/2016   GE junction   GERD (gastroesophageal reflux disease)    Hyperglycemia    Hyperlipidemia    Hypertension    SVT (supraventricular tachycardia) ([place])    2013   Tobacco abuse     Past Surgical History:  Procedure Laterality Date   COLONOSCOPY     LAPAROSCOPIC CHOLECYSTECTOMY  1991   SHOULDER SURGERY Right 1985, 1991, 2000   left x3   [place] teeth extraction     UPPER GASTROINTESTINAL ENDOSCOPY       Current Outpatient Medications:    albuterol (VENTOLIN HFA) 108 (90 Base) MCG/ACT  inhaler, Inhale 2 puffs into the lungs every 6 (six) hours as needed for wheezing or shortness of breath., Disp: 18 g, Rfl: 0   ALPRAZolam (XANAX) 1 MG tablet, Take 1 mg by mouth 3 (three) times daily as needed for anxiety., Disp: , Rfl:    amLODipine (NORVASC) 5 MG tablet, Take 1 tablet (5 mg total) by mouth daily., Disp: 90 tablet, Rfl: 1   azithromycin (ZITHROMAX) 250 MG tablet, 2 pills day 1 and 1 pill day 2-5 with food, Disp: 6 tablet, Rfl: 0   chlorpheniramine-HYDROcodone (TUSSIONEX PENNKINETIC ER) 10-8 MG/5ML SUER, Take 5 mLs by mouth at bedtime as needed., Disp: 115 mL, Rfl: 0   escitalopram (LEXAPRO) 10 MG tablet, Take 10 mg by mouth daily., Disp: , Rfl:    fluticasone (FLONASE) 50 MCG/ACT nasal spray, Place 2 sprays into both nostrils daily., Disp: 16 g, Rfl: 6   HYDROcodone-acetaminophen (NORCO/VICODIN) 5-325 MG tablet, Take by mouth., Disp: , Rfl:    hydrocortisone (CORTEF) 10 MG tablet, Take 40 mg by mouth daily., Disp: , Rfl:    levothyroxine (SYNTHROID) 88 MCG tablet, Take 88 mcg by mouth daily before breakfast., Disp: , Rfl:    testosterone cypionate (DEPOTESTOSTERONE CYPIONATE) 200 MG/ML injection, Inject into the muscle., Disp: , Rfl:   Current Facility-Administered Medications:    0.9 %  sodium chloride infusion, 500 mL, Intravenous, Continuous, Fuller Plan, Pricilla Riffle, MD  EXAMTonette Bihari per patient if applicable:  GENERAL: alert, oriented, appears well and  in no acute distress  HEENT: atraumatic, conjunttiva clear, no obvious abnormalities on inspection of external nose and ears  NECK: normal movements of the head and neck  LUNGS: on inspection no signs of respiratory distress, breathing rate appears normal, no obvious gross SOB, gasping or wheezing +cough on exam   CV: no obvious cyanosis  MS: moves all visible extremities without noticeable abnormality  PSYCH/NEURO: pleasant and cooperative, no obvious depression or anxiety, speech and thought processing  grossly intact  ASSESSMENT AND PLAN:  Discussed the following assessment and plan:  Bronchitis due to COVID-19 virus - Plan: DG Chest 2 View, chlorpheniramine-HYDROcodone (TUSSIONEX PENNKINETIC ER) 10-8 MG/5ML SUER, azithromycin (ZITHROMAX) 250 MG tablet, albuterol (VENTOLIN HFA) 108 (90 Base) MCG/ACT inhaler, Ambulatory referral for Covid Treatment  Sore throat - Plan: Ambulatory referral for Covid Treatment  Fever, unspecified fever cause - Plan: Ambulatory referral for Covid Treatment  Called [facility] Dr. Sydell Axon to disc medrol dose pk with pt being on sick day protocol hydrocortisone [phone] Spoke with Dr. Beryle Lathe  Rx Medrol 4 mg Use as directed once you stop go back to hydrocortisone 30/10=sick protocol if you are still feeling sick and once well reduce hydrocortisone to 15/5. [facility] will try to get the infusion as well  Placed stat referral for [facility] as well  R/o pneumonia with CXR  Contacted infusion line 3x order in  There is no medication other than over the counter meds:  Mucinex dm green label for cough.  Vitamin C 1000 mg daily.  Vitamin D3 4000 Iu (units) daily.  Zinc 100 mg daily.  Quercetin 250-500 mg 2 times per day   Elderberry  Oil of oregano  cepacol or chloroseptic spray  Warm tea with honey and lemon  Hydration  Try to eat though you dont feel like it   Tylenol or Advil  Nasal saline  Flonase     Monitor pulse oximeter, buy from [place] if oxygen is less than 90 please go to the hospital.        Are you feeling really sick? Shortness of breath, cough, chest pain?, dizziness? Confusion   If so let me know  If worsening, go to hospital or [facility] clinic Urgent care for further treatment    Tumors inner ear canal monitoring ? Mets vs acoustic neuroma IMPRESSION:  1. Unchanged punctate enhancing foci in the bilateral internal auditory  canals, left greater than right, which remain suspicious for metastatic  disease. Vestibular  schwannomas could have a similar appearance but are  felt to be less likely given bilaterality and clinical history.  2. No acute intracranial process.   f/u Dr. Karmen Stabs h/o   -we discussed possible serious and likely etiologies, options for evaluation and workup, limitations of telemedicine visit vs in person visit, treatment, treatment risks and precautions.   I discussed the assessment and treatment plan with the patient. The patient was provided an opportunity to ask questions and all were answered. The patient agreed with the plan and demonstrated an understanding of the instructions.    Time spent 30 min Delorise Jackson, MD

## 2020-05-30 NOTE — Telephone Encounter (Signed)
Yes been on the phone with his [facility] today and they will coordinate informed pt as well of this

## 2020-05-30 NOTE — Progress Notes (Signed)
Tested positive last Friday morning from home. Did PCR test Saturday that was positive as well.  Symptoms include headache, congestion, sore throat, cough, and chest discomfort. Patient also having slight fever, SOB, and body aches.  Patient on thyroid, steroid, and hormone replacement therapy for adrenal insufficiency due to cancer treatment.   Patient also has two tumors in the ear area.

## 2020-05-30 NOTE — Telephone Encounter (Signed)
Just received work Dr.Afreen [employer] [facility] whom I spoke with today and whom I did residency with at [facility] pts endocrinologist was able to coordinate infusion for patient with [facility] 05/31/20

## 2020-05-30 NOTE — Addendum Note (Signed)
Addended by: Orland Mustard on: 05/30/2020 11:24 AM   Modules accepted: Orders

## 2020-05-31 DIAGNOSIS — U071 COVID-19: Secondary | ICD-10-CM | POA: Diagnosis not present

## 2020-06-06 DIAGNOSIS — E2749 Other adrenocortical insufficiency: Secondary | ICD-10-CM | POA: Diagnosis not present

## 2020-06-06 DIAGNOSIS — E236 Other disorders of pituitary gland: Secondary | ICD-10-CM | POA: Diagnosis not present

## 2020-06-06 DIAGNOSIS — E039 Hypothyroidism, unspecified: Secondary | ICD-10-CM | POA: Diagnosis not present

## 2020-06-06 DIAGNOSIS — E273 Drug-induced adrenocortical insufficiency: Secondary | ICD-10-CM | POA: Diagnosis not present

## 2020-06-06 DIAGNOSIS — E038 Other specified hypothyroidism: Secondary | ICD-10-CM | POA: Diagnosis not present

## 2020-06-09 ENCOUNTER — Other Ambulatory Visit: Payer: Self-pay

## 2020-06-09 NOTE — Progress Notes (Signed)
Cardiology Office Note  Date:  06/10/2020   ID:  Jose Hayes, DOB 1961-04-29, MRN [id number]  PCP:  Leone Haven, MD   Chief Complaint  Patient presents with  . Other    2-3 month f/u zio no complaints today. Meds reviewed verbally with pt.    HPI:  Jose Hayes is a very pleasant 60 year-old gentleman with  obesity,  smoking history, quit  [facility] 11/05/2011 with SVT Rhythm broke to normal sinus rhythm in the emergency room. Adenosine may have worked Stomach cancer, completed chemotherapy, developed adrenal and thyroid issues He presents for routine followup of his arrhythmia/SVT  Completed stomach cancer therapy, 2 years "knocked out pituitary" On supplement therapy, adrenal, thyroid, testerosterone Completed chemotherapy through [facility] clinical trial  Fevers, fatigue daily last year H/A, tinnitus, tumors followed by MRI Possible acoustic neuroma vs mets  Retired from  Engineer, technical sales at [employer]. On long term disability  Medical fatigue, depleting cortisol Tried to treat with steroids Seeing therapist, celexa/xanax  Covid Jan 2022, "got through it" Ab infusion  Event monitor: NSR, 1 run SVT Triggered events (11) , one associated with short run of SVT, other triggers not associated with significant arrhythmia.  Declined repeat EKG today   other past medical history  Echocardiogram was essentially normal with normal ejection fraction, normal biventricular systolic pressures.   PMH:   has a past medical history of Allergy, COVID-19, Esophageal cancer ([place]) (10/21/2016), GERD (gastroesophageal reflux disease), Hyperglycemia, Hyperlipidemia, Hypertension, SVT (supraventricular tachycardia) ([place]), and Tobacco abuse.  PSH:    Past Surgical History:  Procedure Laterality Date  . COLONOSCOPY    . LAPAROSCOPIC CHOLECYSTECTOMY  1991  . SHOULDER SURGERY Right 1985, 1991, 2000   left x3  . [place] teeth extraction    . UPPER GASTROINTESTINAL ENDOSCOPY      Current  Outpatient Medications  Medication Sig Dispense Refill  . albuterol (VENTOLIN HFA) 108 (90 Base) MCG/ACT inhaler Inhale 2 puffs into the lungs every 6 (six) hours as needed for wheezing or shortness of breath. 18 g 0  . ALPRAZolam (XANAX) 1 MG tablet Take 1 mg by mouth 3 (three) times daily as needed for anxiety.    Marland Kitchen amLODipine (NORVASC) 5 MG tablet Take 1 tablet (5 mg total) by mouth daily. 90 tablet 1  . escitalopram (LEXAPRO) 10 MG tablet Take 15 mg by mouth daily.    . fluticasone (FLONASE) 50 MCG/ACT nasal spray Place 2 sprays into both nostrils daily. 16 g 6  . HYDROcodone-acetaminophen (NORCO/VICODIN) 5-325 MG tablet Take by mouth.    . hydrocortisone (CORTEF) 10 MG tablet Take by mouth daily. Takes 1.5 tablet am daily and 1 tablet pm qd.    . levothyroxine (SYNTHROID) 88 MCG tablet Take 88 mcg by mouth daily before breakfast.    . testosterone cypionate (DEPOTESTOSTERONE CYPIONATE) 200 MG/ML injection Inject into the muscle.     Current Facility-Administered Medications  Medication Dose Route Frequency Provider Last Rate Last Admin  . 0.9 %  sodium chloride infusion  500 mL Intravenous Continuous Ladene Artist, MD        Allergies:   Patient has no known allergies.   Social History:  The patient  reports that he has quit smoking. His smoking use included cigarettes. He has a 12.50 pack-year smoking history. He has never used smokeless tobacco. He reports previous alcohol use. He reports that he does not use drugs.   Family History:   family history includes Clotting disorder in his father; Hypertension  in his father and mother; Pancreatic cancer in his father.    Review of Systems: Review of Systems  Constitutional: Negative.   HENT: Negative.   Respiratory: Negative.   Cardiovascular: Negative.   Gastrointestinal: Negative.   Musculoskeletal: Negative.   Neurological: Negative.   Psychiatric/Behavioral: Negative.   All other systems reviewed and are  negative.   PHYSICAL EXAM: VS:  BP 140/84 (BP Location: Left Arm, Patient Position: Sitting, Cuff Size: Large)   Pulse 61   Ht 5\' 9"  (1.753 m)   Wt 247 lb 2 oz (112.1 kg)   SpO2 98%   BMI 36.49 kg/m  , BMI Body mass index is 36.49 kg/m. Constitutional:  oriented to person, place, and time. No distress.  HENT:  Head: Grossly normal Eyes:  no discharge. No scleral icterus.  Neck: No JVD, no carotid bruits  Cardiovascular: Regular rate and rhythm, no murmurs appreciated Pulmonary/Chest: Clear to auscultation bilaterally, no wheezes or rails Abdominal: Soft.  no distension.  no tenderness.  Musculoskeletal: Normal range of motion Neurological:  normal muscle tone. Coordination normal. No atrophy Skin: Skin warm and dry Psychiatric: normal affect, pleasant  Recent Labs: No results found for requested labs within last 8760 hours.    Lipid Panel Lab Results  Component Value Date   CHOL 191 11/06/2011   HDL 41 11/06/2011   LDLCALC 129 (H) 11/06/2011   TRIG 107 11/06/2011      Wt Readings from Last 3 Encounters:  06/10/20 247 lb 2 oz (112.1 kg)  05/30/20 235 lb (106.6 kg)  05/22/20 245 lb (111.1 kg)      ASSESSMENT AND PLAN:  SVT (supraventricular tachycardia) (HCC) Minimal episodes Verapamil as needed, renewed  Mixed hyperlipidemia  carotids to the iliac arteries with minimal if any atherosclerosis noted In the past, did not want want statin We have encouraged continued exercise, careful diet management in an effort to lose weight.  Stomach cancer Treated at [facility], clinical trial Completed his treatment, now in remission Discussed details  Essential hypertension Blood pressure is well controlled on today's visit. No changes made to the medications.  Smoking Quit years ago    Total encounter time more than 25 minutes  Greater than 50% was spent in counseling and coordination of care with the patient   No orders of the defined types were placed in this  encounter.    Signed, Jose Hayes, M.D., Ph.D. 06/10/2020  [place], [place]

## 2020-06-10 ENCOUNTER — Ambulatory Visit: Payer: Medicare HMO | Admitting: Cardiovascular Disease

## 2020-06-10 ENCOUNTER — Encounter: Payer: Self-pay | Admitting: Cardiovascular Disease

## 2020-06-10 VITALS — BP 140/84 | HR 61 | Ht 69.0 in | Wt 247.1 lb

## 2020-06-10 DIAGNOSIS — I10 Essential (primary) hypertension: Secondary | ICD-10-CM | POA: Diagnosis not present

## 2020-06-10 DIAGNOSIS — R002 Palpitations: Secondary | ICD-10-CM | POA: Diagnosis not present

## 2020-06-10 DIAGNOSIS — R42 Dizziness and giddiness: Secondary | ICD-10-CM | POA: Diagnosis not present

## 2020-06-10 DIAGNOSIS — E782 Mixed hyperlipidemia: Secondary | ICD-10-CM | POA: Diagnosis not present

## 2020-06-10 DIAGNOSIS — I471 Supraventricular tachycardia, unspecified: Secondary | ICD-10-CM

## 2020-06-10 MED ORDER — VERAPAMIL HCL 40 MG PO TABS
40.0000 mg | ORAL_TABLET | Freq: Three times a day (TID) | ORAL | 1 refills | Status: DC | PRN
Start: 2020-06-10 — End: 2022-05-04

## 2020-06-10 NOTE — Patient Instructions (Addendum)
Medication Instructions:  Verapamil 40 mg by mouth  Take up to 3 times a day   as needed for SVT (very fast heart rate)  Lab work: No new labs needed  Testing/Procedures: No new testing needed   Follow-Up: At [facility], you and your health needs are our priority.  As part of our continuing mission to provide you with exceptional heart care, we have created designated Provider Care Teams.  These Care Teams include your primary Cardiologist (physician) and Advanced Practice Providers (APPs -  Physician Assistants and Nurse Practitioners) who all work together to provide you with the care you need, when you need it.  . You will need a follow up appointment in 12 months  . Providers on your designated Care Team:   . Murray Hodgkins, NP . Christell Faith, PA-C . Marrianne Mood, PA-C  Any Other Special Instructions Will Be Listed Below (If Applicable).  COVID-19 Vaccine Information can be found at: ShippingScam.co.uk For questions related to vaccine distribution or appointments, please email [email or web address] or call [phone].

## 2020-06-11 ENCOUNTER — Other Ambulatory Visit: Payer: Self-pay

## 2020-06-11 ENCOUNTER — Ambulatory Visit (INDEPENDENT_AMBULATORY_CARE_PROVIDER_SITE_OTHER): Payer: Medicare HMO | Admitting: Family Medicine

## 2020-06-11 ENCOUNTER — Encounter: Payer: Self-pay | Admitting: Family Medicine

## 2020-06-11 DIAGNOSIS — E2740 Unspecified adrenocortical insufficiency: Secondary | ICD-10-CM | POA: Diagnosis not present

## 2020-06-11 DIAGNOSIS — E039 Hypothyroidism, unspecified: Secondary | ICD-10-CM | POA: Diagnosis not present

## 2020-06-11 DIAGNOSIS — Z1211 Encounter for screening for malignant neoplasm of colon: Secondary | ICD-10-CM | POA: Insufficient documentation

## 2020-06-11 DIAGNOSIS — F419 Anxiety disorder, unspecified: Secondary | ICD-10-CM

## 2020-06-11 DIAGNOSIS — C160 Malignant neoplasm of cardia: Secondary | ICD-10-CM

## 2020-06-11 DIAGNOSIS — R69 Illness, unspecified: Secondary | ICD-10-CM | POA: Diagnosis not present

## 2020-06-11 DIAGNOSIS — E782 Mixed hyperlipidemia: Secondary | ICD-10-CM | POA: Diagnosis not present

## 2020-06-11 DIAGNOSIS — I10 Essential (primary) hypertension: Secondary | ICD-10-CM | POA: Diagnosis not present

## 2020-06-11 LAB — LIPID PANEL
Cholesterol: 176 mg/dL (ref 0–200)
HDL: 55.9 mg/dL (ref >39.00)
LDL Cholesterol: 108 mg/dL — ABNORMAL HIGH (ref 0–99)
NonHDL: 120.32
Total CHOL/HDL Ratio: 3
Triglycerides: 61 mg/dL (ref 0.0–149.0)
VLDL: 12.2 mg/dL (ref 0.0–40.0)

## 2020-06-11 MED ORDER — TETANUS-DIPHTH-ACELL PERTUSSIS 5-2.5-18.5 LF-MCG/0.5 IM SUSP: 0.5000 mL | Freq: Once | INTRAMUSCULAR | 0 refills | Status: AC

## 2020-06-11 NOTE — Progress Notes (Signed)
Tommi Rumps, MD Phone: [phone]  Jose Hayes is a 60 y.o. male who presents today for f/u.  History of COVID-19: Patient notes he developed a head cold and then it progressed to his chest.  He ended up getting a monoclonal antibody infusion through [facility] and notes his symptoms improved within 24 hours of that.  He notes he is back to his baseline now.  Hypertension: Typically 120/80.  Taking amlodipine.  No chest pain, shortness of breath, or edema.  Adrenal insufficiency, hypothyroidism, hypogonadism: These are the results of treatment for his esophageal cancer.  He follows with endocrinology at [facility] every 4 months and they do follow lab work.  He is on replacement therapy with hydrocortisone, Synthroid, and testosterone.  He did have issues with persistent low-grade fevers, fatigue, and nausea that they now feel may have been related to these issues.  Acoustic neuroma: Patient with likely acoustic neuroma on imaging.  He is following with neurology and oncology at [facility].  Initially it was felt these may be metastatic disease from his esophageal cancer though they have subsequently determined that 1 is likely an acoustic neuroma and the other side was likely inflammatory.  Anxiety: Patient notes he developed anxiety with panic attacks related to his medical issues surrounding his prior cancer as well as his endocrinology issues that have developed.  He has been seeing a Social worker and a psychiatrist.  He is currently on Lexapro 15 mg once daily and notes that has been quite beneficial.  Social History   Tobacco Use  Smoking Status Former Smoker  . Packs/day: 0.50  . Years: 25.00  . Pack years: 12.50  . Types: Cigarettes  Smokeless Tobacco Never Used  Tobacco Comment   trying gum to quit    Current Outpatient Medications on File Prior to Visit  Medication Sig Dispense Refill  . albuterol (VENTOLIN HFA) 108 (90 Base) MCG/ACT inhaler Inhale 2 puffs into the lungs every 6  (six) hours as needed for wheezing or shortness of breath. 18 g 0  . ALPRAZolam (XANAX) 1 MG tablet Take 1 mg by mouth 3 (three) times daily as needed for anxiety.    Marland Kitchen amLODipine (NORVASC) 5 MG tablet Take 1 tablet (5 mg total) by mouth daily. 90 tablet 1  . escitalopram (LEXAPRO) 10 MG tablet Take 15 mg by mouth daily.    . fluticasone (FLONASE) 50 MCG/ACT nasal spray Place 2 sprays into both nostrils daily. 16 g 6  . HYDROcodone-acetaminophen (NORCO/VICODIN) 5-325 MG tablet Take by mouth.    . hydrocortisone (CORTEF) 10 MG tablet Take by mouth daily. Takes 1.5 tablet am daily and 1 tablet pm qd.    . levothyroxine (SYNTHROID) 88 MCG tablet Take 88 mcg by mouth daily before breakfast.    . verapamil (CALAN) 40 MG tablet Take 1 tablet (40 mg total) by mouth 3 (three) times daily as needed. 90 tablet 1  . testosterone cypionate (DEPOTESTOSTERONE CYPIONATE) 200 MG/ML injection Inject into the muscle.     Current Facility-Administered Medications on File Prior to Visit  Medication Dose Route Frequency Provider Last Rate Last Admin  . 0.9 %  sodium chloride infusion  500 mL Intravenous Continuous Ladene Artist, MD         ROS see history of present illness  Objective  Physical Exam Vitals:   06/11/20 1042  BP: 130/80  Pulse: (!) 51  Temp: 98.3 F (36.8 C)  SpO2: 99%    BP Readings from Last 3 Encounters:  06/11/20 130/80  06/10/20 140/84  05/02/20 137/78   Wt Readings from Last 3 Encounters:  06/11/20 244 lb 9.6 oz (110.9 kg)  06/10/20 247 lb 2 oz (112.1 kg)  05/30/20 235 lb (106.6 kg)    Physical Exam Constitutional:      General: He is not in acute distress.    Appearance: He is not diaphoretic.  Cardiovascular:     Rate and Rhythm: Normal rate and regular rhythm.     Heart sounds: Normal heart sounds.  Pulmonary:     Effort: Pulmonary effort is normal.     Breath sounds: Normal breath sounds.  Musculoskeletal:        General: No edema.     Right lower leg: No  edema.     Left lower leg: No edema.  Skin:    General: Skin is warm and dry.  Neurological:     Mental Status: He is alert.      Assessment/Plan: Please see individual problem list.  Problem List Items Addressed This Visit    Adrenal insufficiency [facility])    The patient will continue to follow with endocrinology.      Anxiety    Doing well on Lexapro 15 mg once daily.  He will continue this and continue to see his psychiatrist and counselor.      Colon cancer screening    Refer to GI for colonoscopy.      Relevant Orders   Ambulatory referral to Gastroenterology   Gastroesophageal cancer [facility])    Patient did quite well with treatment.  He will continue to follow with his oncologist.      Hyperlipidemia    Check lab work.      Relevant Orders   Lipid panel   Hypertension    Adequate control.  Continue amlodipine 5 mg once daily.      Hypothyroidism    He will continue treatment and follow-up with endocrinology.          Health Maintenance: The patient will get his tetanus vaccine from the pharmacy.     This visit occurred during the SARS-CoV-2 public health emergency.  Safety protocols were in place, including screening questions prior to the visit, additional usage of staff PPE, and extensive cleaning of exam room while observing appropriate contact time as indicated for disinfecting solutions.    Tommi Rumps, MD [place]

## 2020-06-11 NOTE — Assessment & Plan Note (Signed)
He will continue treatment and follow-up with endocrinology.

## 2020-06-11 NOTE — Assessment & Plan Note (Signed)
Adequate control.  Continue amlodipine 5 mg once daily.

## 2020-06-11 NOTE — Assessment & Plan Note (Signed)
Refer to GI for colonoscopy.

## 2020-06-11 NOTE — Assessment & Plan Note (Signed)
Doing well on Lexapro 15 mg once daily.  He will continue this and continue to see his psychiatrist and counselor.

## 2020-06-11 NOTE — Assessment & Plan Note (Signed)
Patient did quite well with treatment.  He will continue to follow with his oncologist.

## 2020-06-11 NOTE — Assessment & Plan Note (Signed)
The patient will continue to follow with endocrinology.

## 2020-06-11 NOTE — Patient Instructions (Signed)
Nice to see you. We will check lab work today for your cholesterol. Please continue to monitor your blood pressure. Please get your tetanus vaccine through the pharmacy. GI should contact you to schedule colonoscopy.

## 2020-06-11 NOTE — Assessment & Plan Note (Signed)
Check lab work. 

## 2020-06-11 NOTE — Assessment & Plan Note (Signed)
>>  ASSESSMENT AND PLAN FOR ANXIETY WRITTEN ON 06/11/2020 10:57 AM BY SONNENBERG, ERIC G, MD  Doing well on Lexapro  15 mg once daily.  He will continue this and continue to see his psychiatrist and counselor.

## 2020-06-13 DIAGNOSIS — E291 Testicular hypofunction: Secondary | ICD-10-CM | POA: Diagnosis not present

## 2020-06-13 DIAGNOSIS — E039 Hypothyroidism, unspecified: Secondary | ICD-10-CM | POA: Diagnosis not present

## 2020-06-14 ENCOUNTER — Other Ambulatory Visit: Payer: Self-pay | Admitting: Internal Medicine

## 2020-06-14 DIAGNOSIS — J40 Bronchitis, not specified as acute or chronic: Secondary | ICD-10-CM

## 2020-06-19 MED ORDER — ROSUVASTATIN CALCIUM 20 MG PO TABS: 20.0000 mg | ORAL_TABLET | Freq: Every day | ORAL | 3 refills | Status: DC

## 2020-06-19 NOTE — Telephone Encounter (Signed)
Crestor sent to pharmacy. Labs already ordered.

## 2020-07-08 DIAGNOSIS — F418 Other specified anxiety disorders: Secondary | ICD-10-CM | POA: Diagnosis not present

## 2020-07-08 DIAGNOSIS — R69 Illness, unspecified: Secondary | ICD-10-CM | POA: Diagnosis not present

## 2020-07-23 ENCOUNTER — Other Ambulatory Visit: Payer: Self-pay | Admitting: Internal Medicine

## 2020-07-23 ENCOUNTER — Encounter: Payer: Self-pay | Admitting: Internal Medicine

## 2020-07-23 DIAGNOSIS — I10 Essential (primary) hypertension: Secondary | ICD-10-CM

## 2020-07-23 DIAGNOSIS — E785 Hyperlipidemia, unspecified: Secondary | ICD-10-CM

## 2020-07-23 DIAGNOSIS — E039 Hypothyroidism, unspecified: Secondary | ICD-10-CM

## 2020-07-23 DIAGNOSIS — Z1389 Encounter for screening for other disorder: Secondary | ICD-10-CM

## 2020-07-23 DIAGNOSIS — Z125 Encounter for screening for malignant neoplasm of prostate: Secondary | ICD-10-CM

## 2020-07-24 DIAGNOSIS — D333 Benign neoplasm of cranial nerves: Secondary | ICD-10-CM | POA: Diagnosis not present

## 2020-07-24 DIAGNOSIS — R519 Headache, unspecified: Secondary | ICD-10-CM | POA: Diagnosis not present

## 2020-07-24 DIAGNOSIS — G629 Polyneuropathy, unspecified: Secondary | ICD-10-CM | POA: Diagnosis not present

## 2020-07-24 DIAGNOSIS — Z87891 Personal history of nicotine dependence: Secondary | ICD-10-CM | POA: Diagnosis not present

## 2020-07-24 DIAGNOSIS — G43909 Migraine, unspecified, not intractable, without status migrainosus: Secondary | ICD-10-CM | POA: Diagnosis not present

## 2020-07-24 DIAGNOSIS — H9313 Tinnitus, bilateral: Secondary | ICD-10-CM | POA: Diagnosis not present

## 2020-07-24 DIAGNOSIS — C159 Malignant neoplasm of esophagus, unspecified: Secondary | ICD-10-CM | POA: Diagnosis not present

## 2020-07-24 DIAGNOSIS — C160 Malignant neoplasm of cardia: Secondary | ICD-10-CM | POA: Diagnosis not present

## 2020-07-24 DIAGNOSIS — R918 Other nonspecific abnormal finding of lung field: Secondary | ICD-10-CM | POA: Diagnosis not present

## 2020-07-28 ENCOUNTER — Other Ambulatory Visit: Payer: Medicare HMO

## 2020-07-29 ENCOUNTER — Other Ambulatory Visit: Payer: Self-pay

## 2020-07-29 ENCOUNTER — Ambulatory Visit (AMBULATORY_SURGERY_CENTER): Payer: Medicare HMO

## 2020-07-29 ENCOUNTER — Telehealth: Payer: Self-pay

## 2020-07-29 VITALS — Ht 69.0 in | Wt 240.0 lb

## 2020-07-29 DIAGNOSIS — Z86010 Personal history of colonic polyps: Secondary | ICD-10-CM

## 2020-07-29 MED ORDER — PLENVU 140 G PO SOLR: 1.0000 | ORAL | 0 refills | Status: DC

## 2020-07-29 NOTE — Telephone Encounter (Signed)
Dr. Fuller Plan:  Pt has had chemo for esophageal CA 2018.  Pt states his problems making Cortisol.  Because of this patient is to take increased dosing of his steroid therapy under stress protocol per Endocrinologist instructions.  I wanted you to be aware in case it is a concern.

## 2020-07-29 NOTE — Telephone Encounter (Signed)
He should follow his endocrinologist's instructions for peri-procedure corticosteroid dosing. No concern as long as he does this.

## 2020-07-29 NOTE — Progress Notes (Signed)
Pt verified name, DOB, address and insurance during PV today.   Pt mailed instruction packet to included paper to complete and mail back to [facility] with addressed and stamped envelope, , copy of consent form to read and not return, and instructions. Plenvu coupon mailed in packet. PV completed over the phone. Pt encouraged to call with questions or issues.  No allergies to soy or egg Pt is not on blood thinners or diet pills Denies issues with sedation/intubation Denies atrial flutter/fib Denies constipation   Pt is aware of Covid safety and care partner requirements.   Pt was affected by chemo therapy in his cortisol production and is to take increased dosing of his steroid therapy under stress.  Endocrinologist advised pt to do so.

## 2020-07-29 NOTE — Telephone Encounter (Signed)
Pt is aware of Dr. Lynne Leader recommendations

## 2020-08-12 ENCOUNTER — Encounter: Payer: Self-pay | Admitting: Gastroenterology

## 2020-08-13 ENCOUNTER — Other Ambulatory Visit: Payer: Self-pay

## 2020-08-13 ENCOUNTER — Encounter: Payer: Self-pay | Admitting: Gastroenterology

## 2020-08-13 ENCOUNTER — Ambulatory Visit (AMBULATORY_SURGERY_CENTER): Payer: Medicare HMO | Admitting: Gastroenterology

## 2020-08-13 VITALS — BP 87/60 | HR 53 | Temp 98.3°F | Resp 12 | Ht 69.0 in | Wt 240.0 lb

## 2020-08-13 DIAGNOSIS — Z86010 Personal history of colonic polyps: Secondary | ICD-10-CM

## 2020-08-13 DIAGNOSIS — D124 Benign neoplasm of descending colon: Secondary | ICD-10-CM | POA: Diagnosis not present

## 2020-08-13 DIAGNOSIS — Z1211 Encounter for screening for malignant neoplasm of colon: Secondary | ICD-10-CM | POA: Diagnosis not present

## 2020-08-13 DIAGNOSIS — D122 Benign neoplasm of ascending colon: Secondary | ICD-10-CM | POA: Diagnosis not present

## 2020-08-13 DIAGNOSIS — D123 Benign neoplasm of transverse colon: Secondary | ICD-10-CM | POA: Diagnosis not present

## 2020-08-13 MED ORDER — SODIUM CHLORIDE 0.9 % IV SOLN: 500.0000 mL | Freq: Once | INTRAVENOUS | Status: DC

## 2020-08-13 NOTE — Op Note (Signed)
[place] Patient Name: Jose Hayes Procedure Date: 08/13/2020 2:39 PM MRN: [id number] Endoscopist: Ladene Artist , MD Age: 60 Referring MD:  Date of Birth: 05-12-1960 Gender: Male Account #: [id number] Procedure:                Colonoscopy Indications:              Surveillance: Personal history of adenomatous                            polyps on last colonoscopy > 5 years ago Medicines:                Monitored Anesthesia Care Procedure:                Pre-Anesthesia Assessment:                           - Prior to the procedure, a History and Physical                            was performed, and patient medications and                            allergies were reviewed. The patient's tolerance of                            previous anesthesia was also reviewed. The risks                            and benefits of the procedure and the sedation                            options and risks were discussed with the patient.                            All questions were answered, and informed consent                            was obtained. Prior Anticoagulants: The patient has                            taken no previous anticoagulant or antiplatelet                            agents. ASA Grade Assessment: II - A patient with                            mild systemic disease. After reviewing the risks                            and benefits, the patient was deemed in                            satisfactory condition to undergo the procedure.  After obtaining informed consent, the colonoscope                            was passed under direct vision. Throughout the                            procedure, the patient's blood pressure, pulse, and                            oxygen saturations were monitored continuously. The                            Olympus CF-HQ190 [id number]) Colonoscope was                            introduced through the anus  and advanced to the the                            cecum, identified by appendiceal orifice and                            ileocecal valve. The ileocecal valve, appendiceal                            orifice, and rectum were photographed. The quality                            of the bowel preparation was good. The colonoscopy                            was performed without difficulty. The patient                            tolerated the procedure well. Scope In: 2:43:48 PM Scope Out: 3:02:58 PM Scope Withdrawal Time: 0 hours 16 minutes 54 seconds  Total Procedure Duration: 0 hours 19 minutes 10 seconds  Findings:                 The perianal and digital rectal examinations were                            normal.                           Five sessile polyps were found in the descending                            colon (2), transverse colon (2) and ascending colon                            (1). The polyps were 4 to 7 mm in size. These                            polyps were removed with a cold snare. Resection  and retrieval were complete.                           A few small-mouthed diverticula were found in the                            left colon. There was no evidence of diverticular                            bleeding.                           The exam was otherwise without abnormality on                            direct and retroflexion views. Complications:            No immediate complications. Estimated blood loss:                            None. Estimated Blood Loss:     Estimated blood loss: none. Impression:               - Five 4 to 7 mm polyps in the descending colon, in                            the transverse colon and in the ascending colon,                            removed with a cold snare. Resected and retrieved.                           - Mild diverticulosis in the left colon.                           - The examination was  otherwise normal on direct                            and retroflexion views. Recommendation:           - Repeat colonoscopy after studies are complete for                            surveillance based on pathology results.                           - Patient has a contact number available for                            emergencies. The signs and symptoms of potential                            delayed complications were discussed with the                            patient. Return to normal  activities tomorrow.                            Written discharge instructions were provided to the                            patient.                           - High fiber diet.                           - Continue present medications.                           - Await pathology results. Ladene Artist, MD 08/13/2020 3:06:57 PM This report has been signed electronically.

## 2020-08-13 NOTE — Patient Instructions (Signed)
Information on polyps and diverticulosis given to you today.  Await pathology results.  Resume previous diet and medications, including a high fiber diet.  YOU HAD AN ENDOSCOPIC PROCEDURE TODAY AT [facility]:   Refer to the procedure report that was given to you for any specific questions about what was found during the examination.  If the procedure report does not answer your questions, please call your gastroenterologist to clarify.  If you requested that your care partner not be given the details of your procedure findings, then the procedure report has been included in a sealed envelope for you to review at your convenience later.  YOU SHOULD EXPECT: Some feelings of bloating in the abdomen. Passage of more gas than usual.  Walking can help get rid of the air that was put into your GI tract during the procedure and reduce the bloating. If you had a lower endoscopy (such as a colonoscopy or flexible sigmoidoscopy) you may notice spotting of blood in your stool or on the toilet paper. If you underwent a bowel prep for your procedure, you may not have a normal bowel movement for a few days.  Please Note:  You might notice some irritation and congestion in your nose or some drainage.  This is from the oxygen used during your procedure.  There is no need for concern and it should clear up in a day or so.  SYMPTOMS TO REPORT IMMEDIATELY:   Following lower endoscopy (colonoscopy or flexible sigmoidoscopy):  Excessive amounts of blood in the stool  Significant tenderness or worsening of abdominal pains  Swelling of the abdomen that is new, acute  Fever of 100F or higher   For urgent or emergent issues, a gastroenterologist can be reached at any hour by calling [phone]. Do not use MyChart messaging for urgent concerns.    DIET:  We do recommend a small meal at first, but then you may proceed to your regular diet.  Drink plenty of fluids but you should avoid alcoholic  beverages for 24 hours.  ACTIVITY:  You should plan to take it easy for the rest of today and you should NOT DRIVE or use heavy machinery until tomorrow (because of the sedation medicines used during the test).    FOLLOW UP: Our staff will call the number listed on your records 48-72 hours following your procedure to check on you and address any questions or concerns that you may have regarding the information given to you following your procedure. If we do not reach you, we will leave a message.  We will attempt to reach you two times.  During this call, we will ask if you have developed any symptoms of COVID 19. If you develop any symptoms (ie: fever, flu-like symptoms, shortness of breath, cough etc.) before then, please call [phone].  If you test positive for Covid 19 in the 2 weeks post procedure, please call and report this information to [place].    If any biopsies were taken you will be contacted by phone or by letter within the next 1-3 weeks.  Please call us at [phone] if you have not heard about the biopsies in 3 weeks.    SIGNATURES/CONFIDENTIALITY: You and/or your care partner have signed paperwork which will be entered into your electronic medical record.  These signatures attest to the fact that that the information above on your After Visit Summary has been reviewed and is understood.  Full responsibility of the confidentiality of this discharge  information lies with you and/or your care-partner. 

## 2020-08-13 NOTE — Progress Notes (Signed)
To pacu, VSS. Report to [email or web address] 

## 2020-08-13 NOTE — Progress Notes (Signed)
Called to room to assist during endoscopic procedure.  Patient ID and intended procedure confirmed with present staff. Received instructions for my participation in the procedure from the performing physician.  

## 2020-08-15 ENCOUNTER — Telehealth: Payer: Self-pay | Admitting: *Deleted

## 2020-08-15 NOTE — Telephone Encounter (Signed)
  Follow up Call-  Call back number 08/13/2020  Post procedure Call Back phone  # [phone]  Permission to leave phone message Yes  Some recent data might be hidden     Patient questions:  Do you have a fever, pain , or abdominal swelling? No. Pain Score  0 *  Have you tolerated food without any problems?yes  Have you been able to return to your normal activities? Yes.    Do you have any questions about your discharge instructions: Diet   No. Medications  No. Follow up visit  No.  Do you have questions or concerns about your Care? No.  Actions: * If pain score is 4 or above: 1. No action needed, pain <4.Have you developed a fever since your procedure? no  2.   Have you had an respiratory symptoms (SOB or cough) since your procedure? no  3.   Have you tested positive for COVID 19 since your procedure no  4.   Have you had any family members/close contacts diagnosed with the COVID 19 since your procedure?  no   If yes to any of these questions please route to Joylene John, RN and Joella Prince, RN

## 2020-08-16 DIAGNOSIS — L03312 Cellulitis of back [any part except buttock]: Secondary | ICD-10-CM | POA: Diagnosis not present

## 2020-08-16 DIAGNOSIS — S20462A Insect bite (nonvenomous) of left back wall of thorax, initial encounter: Secondary | ICD-10-CM | POA: Diagnosis not present

## 2020-08-16 DIAGNOSIS — W57XXXA Bitten or stung by nonvenomous insect and other nonvenomous arthropods, initial encounter: Secondary | ICD-10-CM | POA: Diagnosis not present

## 2020-08-19 ENCOUNTER — Encounter: Payer: Self-pay | Admitting: Family Medicine

## 2020-08-20 DIAGNOSIS — G4730 Sleep apnea, unspecified: Secondary | ICD-10-CM | POA: Diagnosis not present

## 2020-08-20 DIAGNOSIS — G4733 Obstructive sleep apnea (adult) (pediatric): Secondary | ICD-10-CM | POA: Diagnosis not present

## 2020-08-26 ENCOUNTER — Encounter: Payer: Self-pay | Admitting: Gastroenterology

## 2020-08-28 DIAGNOSIS — R69 Illness, unspecified: Secondary | ICD-10-CM | POA: Diagnosis not present

## 2020-09-09 DIAGNOSIS — S83001A Unspecified subluxation of right patella, initial encounter: Secondary | ICD-10-CM | POA: Diagnosis not present

## 2020-09-09 DIAGNOSIS — M25561 Pain in right knee: Secondary | ICD-10-CM | POA: Diagnosis not present

## 2020-09-19 DIAGNOSIS — G4733 Obstructive sleep apnea (adult) (pediatric): Secondary | ICD-10-CM | POA: Diagnosis not present

## 2020-09-19 DIAGNOSIS — G4730 Sleep apnea, unspecified: Secondary | ICD-10-CM | POA: Diagnosis not present

## 2020-09-23 DIAGNOSIS — R69 Illness, unspecified: Secondary | ICD-10-CM | POA: Diagnosis not present

## 2020-10-20 DIAGNOSIS — G4730 Sleep apnea, unspecified: Secondary | ICD-10-CM | POA: Diagnosis not present

## 2020-10-20 DIAGNOSIS — G4733 Obstructive sleep apnea (adult) (pediatric): Secondary | ICD-10-CM | POA: Diagnosis not present

## 2020-10-24 DIAGNOSIS — R519 Headache, unspecified: Secondary | ICD-10-CM | POA: Diagnosis not present

## 2020-10-24 DIAGNOSIS — H9313 Tinnitus, bilateral: Secondary | ICD-10-CM | POA: Diagnosis not present

## 2020-10-24 DIAGNOSIS — C160 Malignant neoplasm of cardia: Secondary | ICD-10-CM | POA: Diagnosis not present

## 2020-10-29 ENCOUNTER — Other Ambulatory Visit: Payer: Self-pay

## 2020-10-29 ENCOUNTER — Other Ambulatory Visit (INDEPENDENT_AMBULATORY_CARE_PROVIDER_SITE_OTHER): Payer: Medicare HMO

## 2020-10-29 DIAGNOSIS — Z1389 Encounter for screening for other disorder: Secondary | ICD-10-CM

## 2020-10-29 DIAGNOSIS — I10 Essential (primary) hypertension: Secondary | ICD-10-CM | POA: Diagnosis not present

## 2020-10-29 DIAGNOSIS — Z125 Encounter for screening for malignant neoplasm of prostate: Secondary | ICD-10-CM

## 2020-10-29 LAB — CBC WITH DIFFERENTIAL/PLATELET
Basophils Absolute: 0 10*3/uL (ref 0.0–0.1)
Basophils Relative: 1.1 % (ref 0.0–3.0)
Eosinophils Absolute: 0.2 10*3/uL (ref 0.0–0.7)
Eosinophils Relative: 4.8 % (ref 0.0–5.0)
HCT: 45.7 % (ref 39.0–52.0)
Hemoglobin: 15.2 g/dL (ref 13.0–17.0)
Lymphocytes Relative: 43.1 % (ref 12.0–46.0)
Lymphs Abs: 1.8 10*3/uL (ref 0.7–4.0)
MCHC: 33.3 g/dL (ref 30.0–36.0)
MCV: 87.5 fl (ref 78.0–100.0)
Monocytes Absolute: 0.6 10*3/uL (ref 0.1–1.0)
Monocytes Relative: 13.1 % — ABNORMAL HIGH (ref 3.0–12.0)
Neutro Abs: 1.6 10*3/uL (ref 1.4–7.7)
Neutrophils Relative %: 37.9 % — ABNORMAL LOW (ref 43.0–77.0)
Platelets: 212 10*3/uL (ref 150.0–400.0)
RBC: 5.22 Mil/uL (ref 4.22–5.81)
RDW: 14.5 % (ref 11.5–15.5)
WBC: 4.2 10*3/uL (ref 4.0–10.5)

## 2020-10-29 LAB — COMPREHENSIVE METABOLIC PANEL
ALT: 22 U/L (ref 0–53)
AST: 20 U/L (ref 0–37)
Albumin: 4.5 g/dL (ref 3.5–5.2)
Alkaline Phosphatase: 65 U/L (ref 39–117)
BUN: 17 mg/dL (ref 6–23)
CO2: 29 mEq/L (ref 19–32)
Calcium: 9.2 mg/dL (ref 8.4–10.5)
Chloride: 103 mEq/L (ref 96–112)
Creatinine, Ser: 1.02 mg/dL (ref 0.40–1.50)
GFR: 80.22 mL/min (ref >60.00)
Glucose, Bld: 90 mg/dL (ref 70–99)
Potassium: 4.7 mEq/L (ref 3.5–5.1)
Sodium: 139 mEq/L (ref 135–145)
Total Bilirubin: 0.9 mg/dL (ref 0.2–1.2)
Total Protein: 6.7 g/dL (ref 6.0–8.3)

## 2020-10-29 LAB — LIPID PANEL
Cholesterol: 194 mg/dL (ref 0–200)
HDL: 43.7 mg/dL (ref >39.00)
LDL Cholesterol: 134 mg/dL — ABNORMAL HIGH (ref 0–99)
NonHDL: 150.6
Total CHOL/HDL Ratio: 4
Triglycerides: 83 mg/dL (ref 0.0–149.0)
VLDL: 16.6 mg/dL (ref 0.0–40.0)

## 2020-10-29 LAB — PSA: PSA: 1.06 ng/mL (ref 0.10–4.00)

## 2020-10-30 ENCOUNTER — Telehealth: Payer: Self-pay

## 2020-10-30 DIAGNOSIS — R69 Illness, unspecified: Secondary | ICD-10-CM | POA: Diagnosis not present

## 2020-10-30 DIAGNOSIS — I10 Essential (primary) hypertension: Secondary | ICD-10-CM

## 2020-10-30 LAB — URINALYSIS, ROUTINE W REFLEX MICROSCOPIC
Bilirubin, UA: NEGATIVE
Glucose, UA: NEGATIVE
Ketones, UA: NEGATIVE
Leukocytes,UA: NEGATIVE
Nitrite, UA: NEGATIVE
Protein,UA: NEGATIVE
Specific Gravity, UA: 1.021 (ref 1.005–1.030)
Urobilinogen, Ur: 1 mg/dL (ref 0.2–1.0)
pH, UA: 5.5 (ref 5.0–7.5)

## 2020-10-30 LAB — MICROSCOPIC EXAMINATION
Bacteria, UA: NONE SEEN
Casts: NONE SEEN /lpf
Epithelial Cells (non renal): NONE SEEN /hpf (ref 0–10)
WBC, UA: NONE SEEN /hpf (ref 0–5)

## 2020-10-30 NOTE — Telephone Encounter (Signed)
Lab orders placed for [facility] for future labs

## 2020-11-11 ENCOUNTER — Encounter: Payer: Self-pay | Admitting: Family Medicine

## 2020-11-11 ENCOUNTER — Other Ambulatory Visit: Payer: Self-pay

## 2020-11-11 ENCOUNTER — Ambulatory Visit (INDEPENDENT_AMBULATORY_CARE_PROVIDER_SITE_OTHER): Payer: Medicare HMO | Admitting: Family Medicine

## 2020-11-11 DIAGNOSIS — R109 Unspecified abdominal pain: Secondary | ICD-10-CM | POA: Diagnosis not present

## 2020-11-11 NOTE — Patient Instructions (Addendum)
Likely abdominal wall strain. Limit lifting. Try using an abdominal belt/corset.   Use senokot or miralax and limit straining.    If constant or worsening pain, if fever, or if blood in stool, then contact us or get rechecked.   Take care.  Glad to see you.

## 2020-11-11 NOTE — Progress Notes (Signed)
This visit occurred during the SARS-CoV-2 public health emergency.  Safety protocols were in place, including screening questions prior to the visit, additional usage of staff PPE, and extensive cleaning of exam room while observing appropriate contact time as indicated for disinfecting solutions.  H/o GI cancer s/p resection.  Started having pain in LLQ, occ sharp, usually dull and achy.  Visible protrusion on L side per patient report  Noted about 4 days ago.  No FCVD; he still has some episodic nausea at baseline.  No blood in stool or urine.  He had constipation with BM this AM.  No black stools.  Had been recently doing some heavy lifting.  No pain at the time.  More aching with standing.  He has less pain laying down unless laying on L side.  No pain laying on his back.  Pain with twisting, coughing.   Prev colonoscopy with mild diverticulosis in the left colon.  Meds, vitals, and allergies reviewed.   ROS: Per HPI unless specifically indicated in ROS section   GEN: nad, alert and oriented HEENT: ncat NECK: supple w/o LA CV: rrr. PULM: ctab, no inc wob ABD: soft, +bs. LLQ mildly ttp w/o rebound.  No hernia felt.  Not ttp o/w.  He has very minimal slight asymmetry in the left lower quadrant but I do not see or feel an obvious discrete mass. EXT: no edema SKIN: no acute rash He has reproducible pain in the left lower quadrant with twisting.

## 2020-11-12 DIAGNOSIS — R109 Unspecified abdominal pain: Secondary | ICD-10-CM | POA: Insufficient documentation

## 2020-11-12 NOTE — Assessment & Plan Note (Signed)
Differential diagnosis discussed with patient.  Nontoxic.  Okay for outpatient follow-up  Likely abdominal wall strain.  I do not feel a mass or hernia.  He has been doing some recent heavy lifting and he has reproducible symptoms with position change.  He does not have fevers or any blood in his stool so diverticulitis is less likely.  We talked about options.  Given that he is not in distress and I do not suspect an ominous process, I think is reasonable to use an abdominal binder/weight belt, avoid heavy lifting, and observe for now.  If he is having worsening symptoms we can always proceed with imaging and labs but I expect this to be a benign self-limited abdominal wall process.  Discussed bowel regimen.  He agrees with plan.

## 2020-11-17 DIAGNOSIS — K5900 Constipation, unspecified: Secondary | ICD-10-CM | POA: Diagnosis not present

## 2020-11-17 DIAGNOSIS — Z87891 Personal history of nicotine dependence: Secondary | ICD-10-CM | POA: Diagnosis not present

## 2020-11-17 DIAGNOSIS — R918 Other nonspecific abnormal finding of lung field: Secondary | ICD-10-CM | POA: Diagnosis not present

## 2020-11-17 DIAGNOSIS — D333 Benign neoplasm of cranial nerves: Secondary | ICD-10-CM | POA: Diagnosis not present

## 2020-11-17 DIAGNOSIS — E039 Hypothyroidism, unspecified: Secondary | ICD-10-CM | POA: Diagnosis not present

## 2020-11-17 DIAGNOSIS — Z8501 Personal history of malignant neoplasm of esophagus: Secondary | ICD-10-CM | POA: Diagnosis not present

## 2020-11-17 DIAGNOSIS — C160 Malignant neoplasm of cardia: Secondary | ICD-10-CM | POA: Diagnosis not present

## 2020-11-17 DIAGNOSIS — Z08 Encounter for follow-up examination after completed treatment for malignant neoplasm: Secondary | ICD-10-CM | POA: Diagnosis not present

## 2020-11-17 DIAGNOSIS — T451X5D Adverse effect of antineoplastic and immunosuppressive drugs, subsequent encounter: Secondary | ICD-10-CM | POA: Diagnosis not present

## 2020-11-17 DIAGNOSIS — E273 Drug-induced adrenocortical insufficiency: Secondary | ICD-10-CM | POA: Diagnosis not present

## 2020-11-17 DIAGNOSIS — G629 Polyneuropathy, unspecified: Secondary | ICD-10-CM | POA: Diagnosis not present

## 2020-11-17 DIAGNOSIS — C159 Malignant neoplasm of esophagus, unspecified: Secondary | ICD-10-CM | POA: Diagnosis not present

## 2020-11-26 DIAGNOSIS — R69 Illness, unspecified: Secondary | ICD-10-CM | POA: Diagnosis not present

## 2020-12-04 DIAGNOSIS — E273 Drug-induced adrenocortical insufficiency: Secondary | ICD-10-CM | POA: Diagnosis not present

## 2020-12-04 DIAGNOSIS — Z9289 Personal history of other medical treatment: Secondary | ICD-10-CM | POA: Diagnosis not present

## 2020-12-04 DIAGNOSIS — E2740 Unspecified adrenocortical insufficiency: Secondary | ICD-10-CM | POA: Diagnosis not present

## 2020-12-04 DIAGNOSIS — E291 Testicular hypofunction: Secondary | ICD-10-CM | POA: Diagnosis not present

## 2020-12-04 DIAGNOSIS — T50905D Adverse effect of unspecified drugs, medicaments and biological substances, subsequent encounter: Secondary | ICD-10-CM | POA: Diagnosis not present

## 2020-12-04 DIAGNOSIS — E039 Hypothyroidism, unspecified: Secondary | ICD-10-CM | POA: Diagnosis not present

## 2020-12-04 DIAGNOSIS — Z5181 Encounter for therapeutic drug level monitoring: Secondary | ICD-10-CM | POA: Diagnosis not present

## 2020-12-09 ENCOUNTER — Ambulatory Visit: Payer: Medicare HMO | Admitting: Family Medicine

## 2020-12-19 ENCOUNTER — Ambulatory Visit (INDEPENDENT_AMBULATORY_CARE_PROVIDER_SITE_OTHER): Payer: Medicare HMO | Admitting: Family Medicine

## 2020-12-19 ENCOUNTER — Other Ambulatory Visit: Payer: Self-pay

## 2020-12-19 VITALS — BP 124/72 | HR 84 | Temp 97.9°F | Resp 16 | Ht 69.0 in | Wt 224.8 lb

## 2020-12-19 DIAGNOSIS — I10 Essential (primary) hypertension: Secondary | ICD-10-CM | POA: Diagnosis not present

## 2020-12-19 DIAGNOSIS — E2740 Unspecified adrenocortical insufficiency: Secondary | ICD-10-CM | POA: Diagnosis not present

## 2020-12-19 DIAGNOSIS — E785 Hyperlipidemia, unspecified: Secondary | ICD-10-CM | POA: Diagnosis not present

## 2020-12-19 DIAGNOSIS — F419 Anxiety disorder, unspecified: Secondary | ICD-10-CM

## 2020-12-19 DIAGNOSIS — R69 Illness, unspecified: Secondary | ICD-10-CM | POA: Diagnosis not present

## 2020-12-19 DIAGNOSIS — K4090 Unilateral inguinal hernia, without obstruction or gangrene, not specified as recurrent: Secondary | ICD-10-CM | POA: Insufficient documentation

## 2020-12-19 DIAGNOSIS — K4020 Bilateral inguinal hernia, without obstruction or gangrene, not specified as recurrent: Secondary | ICD-10-CM

## 2020-12-19 MED ORDER — TRAMADOL HCL 50 MG PO TABS: 50.0000 mg | ORAL_TABLET | Freq: Three times a day (TID) | ORAL | 0 refills | Status: AC | PRN

## 2020-12-19 MED ORDER — TETANUS-DIPHTHERIA TOXOIDS TD 5-2 LFU IM INJ: 0.5000 mL | INJECTION | Freq: Once | INTRAMUSCULAR | 0 refills | Status: AC

## 2020-12-19 NOTE — Assessment & Plan Note (Addendum)
Patient with apparent bilateral inguinal hernias.  Will refer to general surgery at [facility] for evaluation.  We will treat his pain with as needed use of tramadol.  Discussed trying to use this rarely given the issues with combination with his venlafaxine as well as his adrenal insufficiency.  He was counseled on risk of serotonin syndrome.  Discussed risk of drowsiness with this medication.  Advised of hernia return precautions.

## 2020-12-19 NOTE — Assessment & Plan Note (Signed)
Continue Crestor 20 mg once daily.  Check lipid panel.

## 2020-12-19 NOTE — Assessment & Plan Note (Signed)
Well-controlled.  He will continue amlodipine 5 mg once daily.

## 2020-12-19 NOTE — Progress Notes (Signed)
Tommi Rumps, MD Phone: [phone]  Jose Hayes is a 60 y.o. male who presents today for f/u.  HYPERTENSION Disease Monitoring Home BP Monitoring not checking Chest pain- no    Dyspnea- no Medications Compliance-  taking amlodipine.  Edema- no  Anxiety: Patient has been seeing a psychiatrist through the cancer center at [facility].  He is on venlafaxine.  Notes he rarely has to take Xanax.  He notes this is relatively well controlled.  Adrenal insufficiency: Patient has been seeing endocrinology at [facility] for this.  They have him on a pretty good hydrocortisone regimen that they are dialing in.  He is also on Synthroid and testosterone supplementation.  Inguinal hernia: Patient notes he had a pouch in his left lower quadrant.  The first provider he ended up seeing thought it was an abdominal wall strain though he had a CT scan that date that revealed a left inguinal hernia.  He notes it reduces most the time.  He has bowel movements most days.  He passes gas daily.  Does note some aching discomfort that Tylenol does not help with.  He was taking some hydrocodone for this with good benefit.  Hyperlipidemia: Patient notes he is back on Crestor consistently.  Due for recheck of labs.  Social History   Tobacco Use  Smoking Status Former   Packs/day: 0.50   Years: 25.00   Pack years: 12.50   Types: Cigarettes  Smokeless Tobacco Never  Tobacco Comments   trying gum to quit    Current Outpatient Medications on File Prior to Visit  Medication Sig Dispense Refill   albuterol (VENTOLIN HFA) 108 (90 Base) MCG/ACT inhaler TAKE 2 PUFFS BY MOUTH EVERY 6 HOURS AS NEEDED FOR WHEEZE OR SHORTNESS OF BREATH 18 g 1   ALPRAZolam (XANAX) 1 MG tablet Take 1 mg by mouth 3 (three) times daily as needed for anxiety.     amLODipine (NORVASC) 5 MG tablet Take 1 tablet (5 mg total) by mouth daily. 90 tablet 1   azelastine (ASTELIN) 0.1 % nasal spray Place into both nostrils 2 (two) times daily. Use in  each nostril as directed     escitalopram (LEXAPRO) 5 MG tablet Take by mouth.     fluticasone (FLONASE) 50 MCG/ACT nasal spray Place 2 sprays into both nostrils daily. 16 g 6   hydrocortisone (CORTEF) 10 MG tablet Take by mouth daily. Takes 1.5 tablet am daily and 1 tablet pm qd.     levothyroxine (SYNTHROID) 88 MCG tablet Take 88 mcg by mouth daily before breakfast.     rosuvastatin (CRESTOR) 20 MG tablet Take 1 tablet (20 mg total) by mouth daily. 90 tablet 3   testosterone cypionate (DEPOTESTOSTERONE CYPIONATE) 200 MG/ML injection Inject into the muscle.     venlafaxine XR (EFFEXOR-XR) 37.5 MG 24 hr capsule Take by mouth. Takes 3 capsules daily     verapamil (CALAN) 40 MG tablet Take 1 tablet (40 mg total) by mouth 3 (three) times daily as needed. 90 tablet 1   No current facility-administered medications on file prior to visit.     ROS see history of present illness  Objective  Physical Exam Vitals:   12/19/20 1445  BP: 124/72  Pulse: 84  Resp: 16  Temp: 97.9 F (36.6 C)  SpO2: 97%    BP Readings from Last 3 Encounters:  12/19/20 124/72  11/11/20 120/78  08/13/20 (!) 87/60   Wt Readings from Last 3 Encounters:  12/19/20 224 lb 12.8 oz (102 kg)  11/11/20 221 lb (100.2 kg)  08/13/20 240 lb (108.9 kg)    Physical Exam Constitutional:      General: He is not in acute distress.    Appearance: He is not diaphoretic.  Cardiovascular:     Rate and Rhythm: Normal rate and regular rhythm.     Heart sounds: Normal heart sounds.  Pulmonary:     Effort: Pulmonary effort is normal.     Breath sounds: Normal breath sounds.  Abdominal:     Comments: Left inguinal hernia palpated, possible right inguinal hernia palpated  Skin:    General: Skin is warm and dry.  Neurological:     Mental Status: He is alert.     Assessment/Plan: Please see individual problem list.  Problem List Items Addressed This Visit     Adrenal insufficiency ([place])    Seems to generally be  stable.  He will continue to see his endocrinologist.      Anxiety    Adequately controlled currently.  He will continue venlafaxine.      Hyperlipidemia - Primary    Continue Crestor 20 mg once daily.  Check lipid panel.      Relevant Orders   Direct LDL   Comp Met ([facility])   Hypertension    Well-controlled.  He will continue amlodipine 5 mg once daily.      Inguinal hernia    Patient with apparent bilateral inguinal hernias.  Will refer to general surgery at [facility] for evaluation.  We will treat his pain with as needed use of tramadol.  Discussed trying to use this rarely given the issues with combination with his venlafaxine as well as his adrenal insufficiency.  He was counseled on risk of serotonin syndrome.  Discussed risk of drowsiness with this medication.  Advised of hernia return precautions.      Relevant Medications   traMADol (ULTRAM) 50 MG tablet   Other Relevant Orders   Ambulatory referral to General Surgery     Health Maintenance: Tetanus vaccine to be done at the pharmacy.  He will get his Shingrix vaccine there as well.  He will reschedule for a Prevnar 20.  Return in about 2 weeks (around 01/02/2021) for Nurse visit for Prevnar 20, 6 months PCP.  This visit occurred during the SARS-CoV-2 public health emergency.  Safety protocols were in place, including screening questions prior to the visit, additional usage of staff PPE, and extensive cleaning of exam room while observing appropriate contact time as indicated for disinfecting solutions.    Tommi Rumps, MD [place]

## 2020-12-19 NOTE — Patient Instructions (Signed)
Nice to see you. I have referred you to general surgery at [facility].  They should contact you within a couple of weeks for your hernias.  Please try to limit your lifting to no more than 15 to 20 pounds.  If your hernia remains stuck out or you develop significant pain or lack of bowel movements or gas please seek medical attention.  You can take the tramadol for your discomfort.  This could make you drowsy so please be careful when you take it.  Please try to limit your use of this. Please get your tetanus and Shingrix vaccines at the pharmacy.

## 2020-12-19 NOTE — Assessment & Plan Note (Signed)
Adequately controlled currently.  He will continue venlafaxine.

## 2020-12-19 NOTE — Assessment & Plan Note (Signed)
>>  ASSESSMENT AND PLAN FOR ANXIETY WRITTEN ON 12/19/2020  4:13 PM BY MARIBETH, ERIC G, MD  Adequately controlled currently.  He will continue venlafaxine.

## 2020-12-19 NOTE — Assessment & Plan Note (Signed)
Seems to generally be stable.  He will continue to see his endocrinologist.

## 2020-12-20 LAB — COMPREHENSIVE METABOLIC PANEL
AG Ratio: 2 (calc) (ref 1.0–2.5)
ALT: 22 U/L (ref 9–46)
AST: 20 U/L (ref 10–35)
Albumin: 4.5 g/dL (ref 3.6–5.1)
Alkaline phosphatase (APISO): 65 U/L (ref 35–144)
BUN: 15 mg/dL (ref 7–25)
CO2: 29 mmol/L (ref 20–32)
Calcium: 9.2 mg/dL (ref 8.6–10.3)
Chloride: 105 mmol/L (ref 98–110)
Creat: 1.04 mg/dL (ref 0.70–1.35)
Globulin: 2.2 g/dL (calc) (ref 1.9–3.7)
Glucose, Bld: 102 mg/dL — ABNORMAL HIGH (ref 65–99)
Potassium: 4.1 mmol/L (ref 3.5–5.3)
Sodium: 142 mmol/L (ref 135–146)
Total Bilirubin: 0.8 mg/dL (ref 0.2–1.2)
Total Protein: 6.7 g/dL (ref 6.1–8.1)

## 2020-12-20 LAB — LDL CHOLESTEROL, DIRECT: Direct LDL: 65 mg/dL (ref <100)

## 2020-12-25 ENCOUNTER — Other Ambulatory Visit: Payer: Self-pay | Admitting: Physician Assistant

## 2021-01-12 ENCOUNTER — Encounter: Payer: Self-pay | Admitting: Family Medicine

## 2021-01-13 DIAGNOSIS — T451X5D Adverse effect of antineoplastic and immunosuppressive drugs, subsequent encounter: Secondary | ICD-10-CM | POA: Diagnosis not present

## 2021-01-13 DIAGNOSIS — E785 Hyperlipidemia, unspecified: Secondary | ICD-10-CM | POA: Diagnosis not present

## 2021-01-13 DIAGNOSIS — K4020 Bilateral inguinal hernia, without obstruction or gangrene, not specified as recurrent: Secondary | ICD-10-CM | POA: Diagnosis not present

## 2021-01-13 DIAGNOSIS — Z01818 Encounter for other preprocedural examination: Secondary | ICD-10-CM | POA: Diagnosis not present

## 2021-01-13 DIAGNOSIS — Z87891 Personal history of nicotine dependence: Secondary | ICD-10-CM | POA: Diagnosis not present

## 2021-01-13 DIAGNOSIS — W19XXXA Unspecified fall, initial encounter: Secondary | ICD-10-CM | POA: Diagnosis not present

## 2021-01-13 DIAGNOSIS — R0781 Pleurodynia: Secondary | ICD-10-CM | POA: Diagnosis not present

## 2021-01-13 DIAGNOSIS — I10 Essential (primary) hypertension: Secondary | ICD-10-CM | POA: Diagnosis not present

## 2021-01-13 DIAGNOSIS — D333 Benign neoplasm of cranial nerves: Secondary | ICD-10-CM | POA: Diagnosis not present

## 2021-01-13 DIAGNOSIS — E273 Drug-induced adrenocortical insufficiency: Secondary | ICD-10-CM | POA: Diagnosis not present

## 2021-01-13 DIAGNOSIS — C160 Malignant neoplasm of cardia: Secondary | ICD-10-CM | POA: Diagnosis not present

## 2021-01-13 DIAGNOSIS — F419 Anxiety disorder, unspecified: Secondary | ICD-10-CM | POA: Diagnosis not present

## 2021-01-13 DIAGNOSIS — G4733 Obstructive sleep apnea (adult) (pediatric): Secondary | ICD-10-CM | POA: Diagnosis not present

## 2021-01-13 DIAGNOSIS — Z9989 Dependence on other enabling machines and devices: Secondary | ICD-10-CM | POA: Diagnosis not present

## 2021-01-13 DIAGNOSIS — R69 Illness, unspecified: Secondary | ICD-10-CM | POA: Diagnosis not present

## 2021-01-14 DIAGNOSIS — K4020 Bilateral inguinal hernia, without obstruction or gangrene, not specified as recurrent: Secondary | ICD-10-CM | POA: Insufficient documentation

## 2021-01-20 DIAGNOSIS — H903 Sensorineural hearing loss, bilateral: Secondary | ICD-10-CM | POA: Diagnosis not present

## 2021-01-20 DIAGNOSIS — D333 Benign neoplasm of cranial nerves: Secondary | ICD-10-CM | POA: Diagnosis not present

## 2021-01-20 DIAGNOSIS — H9313 Tinnitus, bilateral: Secondary | ICD-10-CM | POA: Diagnosis not present

## 2021-01-21 DIAGNOSIS — F411 Generalized anxiety disorder: Secondary | ICD-10-CM | POA: Diagnosis not present

## 2021-01-21 DIAGNOSIS — R69 Illness, unspecified: Secondary | ICD-10-CM | POA: Diagnosis not present

## 2021-02-26 DIAGNOSIS — G4733 Obstructive sleep apnea (adult) (pediatric): Secondary | ICD-10-CM | POA: Diagnosis not present

## 2021-02-26 DIAGNOSIS — G4730 Sleep apnea, unspecified: Secondary | ICD-10-CM | POA: Diagnosis not present

## 2021-03-11 DIAGNOSIS — E669 Obesity, unspecified: Secondary | ICD-10-CM | POA: Diagnosis not present

## 2021-03-11 DIAGNOSIS — M222X1 Patellofemoral disorders, right knee: Secondary | ICD-10-CM | POA: Diagnosis not present

## 2021-03-16 ENCOUNTER — Telehealth: Payer: Self-pay | Admitting: Family Medicine

## 2021-03-16 MED ORDER — TETANUS-DIPHTH-ACELL PERTUSSIS 5-2.5-18.5 LF-MCG/0.5 IM SUSP: 0.5000 mL | Freq: Once | INTRAMUSCULAR | 0 refills | Status: AC

## 2021-03-16 NOTE — Telephone Encounter (Signed)
Sent to pharmacy 

## 2021-03-16 NOTE — Telephone Encounter (Signed)
Patient needs a Tdap order placed to [facility] on [address].

## 2021-03-16 NOTE — Telephone Encounter (Signed)
Patient needs a Tdap order placed to [facility] on [address]. Benecio Kluger,cma

## 2021-03-17 DIAGNOSIS — Z125 Encounter for screening for malignant neoplasm of prostate: Secondary | ICD-10-CM | POA: Diagnosis not present

## 2021-03-17 DIAGNOSIS — K219 Gastro-esophageal reflux disease without esophagitis: Secondary | ICD-10-CM | POA: Diagnosis not present

## 2021-03-17 DIAGNOSIS — C160 Malignant neoplasm of cardia: Secondary | ICD-10-CM | POA: Diagnosis not present

## 2021-03-17 DIAGNOSIS — E291 Testicular hypofunction: Secondary | ICD-10-CM | POA: Diagnosis not present

## 2021-03-17 DIAGNOSIS — E039 Hypothyroidism, unspecified: Secondary | ICD-10-CM | POA: Diagnosis not present

## 2021-03-17 DIAGNOSIS — E2740 Unspecified adrenocortical insufficiency: Secondary | ICD-10-CM | POA: Diagnosis not present

## 2021-03-18 ENCOUNTER — Other Ambulatory Visit: Payer: Self-pay

## 2021-03-18 ENCOUNTER — Emergency Department: Payer: Medicare HMO

## 2021-03-18 ENCOUNTER — Emergency Department
Admission: EM | Admit: 2021-03-18 | Discharge: 2021-03-18 | Disposition: A | Discharge status: Home / Self Care | Payer: Medicare HMO | Attending: Emergency Medicine | Admitting: Emergency Medicine

## 2021-03-18 DIAGNOSIS — R42 Dizziness and giddiness: Secondary | ICD-10-CM | POA: Insufficient documentation

## 2021-03-18 DIAGNOSIS — S99921A Unspecified injury of right foot, initial encounter: Secondary | ICD-10-CM | POA: Diagnosis not present

## 2021-03-18 DIAGNOSIS — Z8501 Personal history of malignant neoplasm of esophagus: Secondary | ICD-10-CM | POA: Diagnosis not present

## 2021-03-18 DIAGNOSIS — H9319 Tinnitus, unspecified ear: Secondary | ICD-10-CM | POA: Diagnosis not present

## 2021-03-18 DIAGNOSIS — R519 Headache, unspecified: Secondary | ICD-10-CM | POA: Diagnosis present

## 2021-03-18 DIAGNOSIS — Z85038 Personal history of other malignant neoplasm of large intestine: Secondary | ICD-10-CM | POA: Diagnosis not present

## 2021-03-18 DIAGNOSIS — R110 Nausea: Secondary | ICD-10-CM | POA: Diagnosis not present

## 2021-03-18 DIAGNOSIS — I10 Essential (primary) hypertension: Secondary | ICD-10-CM | POA: Diagnosis not present

## 2021-03-18 DIAGNOSIS — Z79899 Other long term (current) drug therapy: Secondary | ICD-10-CM | POA: Diagnosis not present

## 2021-03-18 DIAGNOSIS — E039 Hypothyroidism, unspecified: Secondary | ICD-10-CM | POA: Insufficient documentation

## 2021-03-18 DIAGNOSIS — G9389 Other specified disorders of brain: Secondary | ICD-10-CM | POA: Diagnosis not present

## 2021-03-18 DIAGNOSIS — Z8616 Personal history of COVID-19: Secondary | ICD-10-CM | POA: Insufficient documentation

## 2021-03-18 DIAGNOSIS — I6782 Cerebral ischemia: Secondary | ICD-10-CM | POA: Diagnosis not present

## 2021-03-18 DIAGNOSIS — H9311 Tinnitus, right ear: Secondary | ICD-10-CM | POA: Diagnosis not present

## 2021-03-18 DIAGNOSIS — Z87891 Personal history of nicotine dependence: Secondary | ICD-10-CM | POA: Insufficient documentation

## 2021-03-18 LAB — COMPREHENSIVE METABOLIC PANEL
ALT: 19 U/L (ref 0–44)
AST: 19 U/L (ref 15–41)
Albumin: 4 g/dL (ref 3.5–5.0)
Alkaline Phosphatase: 53 U/L (ref 38–126)
Anion gap: 6 (ref 5–15)
BUN: 17 mg/dL (ref 6–20)
CO2: 25 mmol/L (ref 22–32)
Calcium: 8.2 mg/dL — ABNORMAL LOW (ref 8.9–10.3)
Chloride: 106 mmol/L (ref 98–111)
Creatinine, Ser: 0.9 mg/dL (ref 0.61–1.24)
GFR, Estimated: >60 mL/min (ref >60)
Glucose, Bld: 131 mg/dL — ABNORMAL HIGH (ref 70–99)
Potassium: 3.2 mmol/L — ABNORMAL LOW (ref 3.5–5.1)
Sodium: 137 mmol/L (ref 135–145)
Total Bilirubin: 1.1 mg/dL (ref 0.3–1.2)
Total Protein: 6.9 g/dL (ref 6.5–8.1)

## 2021-03-18 LAB — CBC WITH DIFFERENTIAL/PLATELET
Abs Immature Granulocytes: 0.02 10*3/uL (ref 0.00–0.07)
Basophils Absolute: 0 10*3/uL (ref 0.0–0.1)
Basophils Relative: 1 %
Eosinophils Absolute: 0.3 10*3/uL (ref 0.0–0.5)
Eosinophils Relative: 5 %
HCT: 42.4 % (ref 39.0–52.0)
Hemoglobin: 14.4 g/dL (ref 13.0–17.0)
Immature Granulocytes: 0 %
Lymphocytes Relative: 27 %
Lymphs Abs: 1.6 10*3/uL (ref 0.7–4.0)
MCH: 29.6 pg (ref 26.0–34.0)
MCHC: 34 g/dL (ref 30.0–36.0)
MCV: 87.1 fL (ref 80.0–100.0)
Monocytes Absolute: 0.5 10*3/uL (ref 0.1–1.0)
Monocytes Relative: 8 %
Neutro Abs: 3.6 10*3/uL (ref 1.7–7.7)
Neutrophils Relative %: 59 %
Platelets: 188 10*3/uL (ref 150–400)
RBC: 4.87 MIL/uL (ref 4.22–5.81)
RDW: 13.5 % (ref 11.5–15.5)
WBC: 6 10*3/uL (ref 4.0–10.5)
nRBC: 0 % (ref 0.0–0.2)

## 2021-03-18 MED ORDER — ALPRAZOLAM 0.5 MG PO TABS
1.0000 mg | ORAL_TABLET | Freq: Once | ORAL | Status: AC
  Administered 2021-03-18: 1 mg via ORAL
  Filled 2021-03-18: qty 2

## 2021-03-18 MED ORDER — OXYCODONE-ACETAMINOPHEN 5-325 MG PO TABS: 1.0000 | ORAL_TABLET | ORAL | 0 refills | Status: AC | PRN

## 2021-03-18 MED ORDER — OXYCODONE-ACETAMINOPHEN 5-325 MG PO TABS
1.0000 | ORAL_TABLET | Freq: Once | ORAL | Status: AC
  Administered 2021-03-18: 1 via ORAL
  Filled 2021-03-18: qty 1

## 2021-03-18 MED ORDER — METOCLOPRAMIDE HCL 10 MG PO TABS
10.0000 mg | ORAL_TABLET | Freq: Once | ORAL | Status: AC
  Administered 2021-03-18: 10 mg via ORAL
  Filled 2021-03-18: qty 1

## 2021-03-18 MED ORDER — GADOBUTROL 1 MMOL/ML IV SOLN
10.0000 mL | Freq: Once | INTRAVENOUS | Status: AC | PRN
  Administered 2021-03-18: 10 mL via INTRAVENOUS

## 2021-03-18 MED ORDER — ALPRAZOLAM 0.5 MG PO TABS
0.5000 mg | ORAL_TABLET | Freq: Once | ORAL | Status: AC
  Administered 2021-03-18: 0.5 mg via ORAL
  Filled 2021-03-18: qty 1

## 2021-03-18 NOTE — ED Notes (Signed)
Pt on phone with MRI at this time.  

## 2021-03-18 NOTE — Discharge Instructions (Signed)
Your MRI today did not show any new change.  You can take the Percocet as needed for pain.  Please follow-up with your primary care provider

## 2021-03-18 NOTE — ED Notes (Signed)
Pt taken to MRI at this time

## 2021-03-18 NOTE — ED Notes (Signed)
Labs done yesterday

## 2021-03-18 NOTE — ED Notes (Signed)
Pt states increasing dizziness and tinnitus with change in pitch and "wooshing sound" different from baseline tinnitus. C/O nausea at this time.

## 2021-03-18 NOTE — ED Triage Notes (Signed)
Pt to ED for sudden dizziness and increased tinnitus in both ears. +nausea. Dizziness worsens with change in position  Hx tinnitus, headaches, tumors in brain   Stepped on nail with right foot Sunday, went and got tetanus shot.

## 2021-03-18 NOTE — ED Provider Notes (Signed)
[facility]  ____________________________________________   Event Date/Time   First MD Initiated Contact with Patient 03/18/21 1451     (approximate)  I have reviewed the triage vital signs and the nursing notes.   HISTORY  Chief Complaint Dizziness    HPI Jose Hayes is a 60 y.o. male with pmh of metastatic esophageal carcinoma and L acoustic neuroma who presents with dizziness.  Patient tells me that he has chronic tinnitus and headaches however on Sunday about 3 days ago there was a sudden change in the quality of his tinnitus.  Says it normally sounds like crickets chirping but since Sunday it has had a high pitch and been throbbing in quality.  He also has had occasional headache that is intermittent, sharp in quality sometimes on the right sometimes on the left.  He does not have associated change in his hearing.  Denies significant vertigo dizziness new weakness or numbness visual change.  He does note that when he walks around the tinnitus seems to get significantly worse and almost makes him feel like he is going to blackout.  Denies fevers chills nausea vomiting chest pain or dyspnea. Pts last MRI was in 10/24/20 which showed an unchanged punctate focus of enhancement like vestibular schwannoma.  Patient does note that he stepped on a nail about 4 days ago.  This did not go through the sole of his shoe.  He had his tetanus updated.         Past Medical History:  Diagnosis Date   Allergy    Anxiety    COVID-19    1/14, 05/24/20   Esophageal cancer ([place]) 10/21/2016   GE junction   GERD (gastroesophageal reflux disease)    Hyperglycemia    Hyperlipidemia    Hypertension    Sleep apnea    CPAP   SVT (supraventricular tachycardia) ([place])    2013   Thyroid disease    Tobacco abuse     Patient Active Problem List   Diagnosis Date Noted   Inguinal hernia 12/19/2020   Abdominal pain 11/12/2020   Colon cancer screening 06/11/2020    Adrenal insufficiency ([place]) 05/30/2020   Hypothyroidism 05/30/2020   Anxiety 05/30/2020   Gastroesophageal cancer ([place]) 11/02/2016   Kidney stones 10/13/2016   Restless leg syndrome 09/04/2015   Hyperlipidemia 12/01/2011   SVT (supraventricular tachycardia) ([place]) 12/01/2011   Obesity 12/01/2011   Hypertension 12/01/2011    Past Surgical History:  Procedure Laterality Date   COLONOSCOPY     [place] Right 1985, 1991, 2000   left x3   [place] teeth extraction     UPPER GASTROINTESTINAL ENDOSCOPY      Prior to Admission medications   Medication Sig Start Date End Date Taking? Authorizing Provider  ALPRAZolam Duanne Moron) 1 MG tablet Take 1 mg by mouth 3 (three) times daily as needed for anxiety.   Yes [provider]  amLODipine (NORVASC) 5 MG tablet TAKE 1 TABLET BY MOUTH EVERY DAY 12/26/20  Yes Dunn, Areta Haber, PA-C  hydrocortisone (CORTEF) 10 MG tablet Take by mouth daily. Takes 1.5 tablet am daily and 1 tablet pm qd.   Yes [provider]  levothyroxine (SYNTHROID) 88 MCG tablet Take 88 mcg by mouth daily before breakfast.   Yes [provider]  oxyCODONE-acetaminophen (PERCOCET) 5-325 MG tablet Take 1 tablet by mouth every 4 (four) hours as needed for up to 3 days for severe pain. 03/18/21 03/21/21 Yes Klee Kolek,  Jennette Dubin, MD  rosuvastatin (CRESTOR) 20 MG tablet Take 1 tablet (20 mg total) by mouth daily. 06/19/20  Yes Leone Haven, MD  testosterone cypionate (DEPOTESTOSTERONE CYPIONATE) 200 MG/ML injection Inject 0.38 mg into the muscle once a week. 03/26/20 03/18/21 Yes [provider]  venlafaxine XR (EFFEXOR-XR) 150 MG 24 hr capsule Take 150 mg by mouth daily. 02/19/21  Yes [provider]  albuterol (VENTOLIN HFA) 108 (90 Base) MCG/ACT inhaler TAKE 2 PUFFS BY MOUTH EVERY 6 HOURS AS NEEDED FOR WHEEZE OR SHORTNESS OF BREATH 06/16/20   McLean-Scocuzza, Nino Glow, MD  azelastine (ASTELIN) 0.1 % nasal  spray Place into both nostrils 2 (two) times daily. Use in each nostril as directed    [provider]  fluticasone (FLONASE) 50 MCG/ACT nasal spray Place 2 sprays into both nostrils daily. 07/27/19   Leone Haven, MD  hydrOXYzine (ATARAX/VISTARIL) 10 MG tablet Take 10 mg by mouth 3 (three) times daily as needed for anxiety. 01/21/21   [provider]  verapamil (CALAN) 40 MG tablet Take 1 tablet (40 mg total) by mouth 3 (three) times daily as needed. 06/10/20   Minna Merritts, MD    Allergies Patient has no known allergies.  Family History  Problem Relation Age of Onset   Hypertension Mother    Hypertension Father    Pancreatic cancer Father    Clotting disorder Father        renal cell ca   Colon cancer Neg Hx    Esophageal cancer Neg Hx    Prostate cancer Neg Hx    Rectal cancer Neg Hx    Stomach cancer Neg Hx    Colon polyps Neg Hx     Social History Social History   Tobacco Use   Smoking status: Former    Packs/day: 0.50    Years: 25.00    Pack years: 12.50    Types: Cigarettes   Smokeless tobacco: Never   Tobacco comments:    trying gum to quit  Vaping Use   Vaping Use: Former  Substance Use Topics   Alcohol use: Not Currently    Comment: occasionally   Drug use: No    Review of Systems   Review of Systems  Constitutional:  Negative for chills and fever.  HENT:  Positive for tinnitus.   Respiratory:  Negative for shortness of breath.   Cardiovascular:  Negative for chest pain.  Neurological:  Positive for headaches. Negative for weakness and numbness.  All other systems reviewed and are negative.  Physical Exam Updated Vital Signs BP (!) 105/58   Pulse 61   Temp 97.8 F (36.6 C) (Oral)   Resp 18   Ht 5\' 8"  (1.727 m)   Wt 104.3 kg   SpO2 95%   BMI 34.97 kg/m   Physical Exam Vitals and nursing note reviewed.  Constitutional:      General: He is not in acute distress.    Appearance: Normal appearance.  HENT:     Head:  Normocephalic and atraumatic.  Eyes:     General: No scleral icterus.    Conjunctiva/sclera: Conjunctivae normal.  Pulmonary:     Effort: Pulmonary effort is normal. No respiratory distress.     Breath sounds: Normal breath sounds. No wheezing.  Musculoskeletal:        General: No deformity or signs of injury.     Cervical back: Normal range of motion.     Comments: No Swelling or evidence of infection in  the bilateral feet  Skin:    Coloration: Skin is not jaundiced or pale.  Neurological:     General: No focal deficit present.     Mental Status: He is alert and oriented to person, place, and time. Mental status is at baseline.     Comments: Aox3, nml speech  PERRL, EOMI, face symmetric, nml tongue movement  5/5 strength in the BL upper and lower extremities  Sensation grossly intact in the BL upper and lower extremities  Finger-nose-finger intact BL   Psychiatric:        Mood and Affect: Mood normal.        Behavior: Behavior normal.     LABS (all labs ordered are listed, but only abnormal results are displayed)  Labs Reviewed  COMPREHENSIVE METABOLIC PANEL - Abnormal; Notable for the following components:      Result Value   Potassium 3.2 (*)    Glucose, Bld 131 (*)    Calcium 8.2 (*)    All other components within normal limits  CBC WITH DIFFERENTIAL/PLATELET   ____________________________________________  EKG  NSR, nml axis, nml intervals, no acute ischemic changes  ____________________________________________  RADIOLOGY Almeta Monas, personally viewed and evaluated these images (plain radiographs) as part of my medical decision making, as well as reviewing the written report by the radiologist.  ED MD interpretation:   I reviewed the CT scan of the brain which does not show any acute intracranial process   I reviewed the x-ray of the right foot which does not show any acute  fracture    ____________________________________________   PROCEDURES  Procedure(s) performed (including Critical Care):  Procedures   ____________________________________________   INITIAL IMPRESSION / ASSESSMENT AND PLAN / ED COURSE     27-year-old male with past medical history of left vestibular schwannoma presents with worsening and acute change in quality of his chronic tinnitus as well as headache.  There is no other associated neurologic symptoms.  He has no systemic symptoms.  On exam is neurologic exam.  Due to concern about his underlying tumor I did obtain an MRI to rule out any acute change such as bleeding into the tumor or acute change in size as well as posterior stroke.  The MRI does not have any acute findings although radiology cannot review his prior images the description is nearly the same of his prior lesion.  Discussed the results with the patient.  We had a long discussion about his chronic pain and it seems like this is a big issue for him.  Both chronic headaches as well as his neuropathy and pain related to his chemotherapy.  He expresses frustration that none of his doctors are prescribing him adequate pain control.  I advised that he try to see a pain management specialist to manage this for him.  It agree to write for short course of Percocet given he cannot take NSAIDs because of his prior esophageal cancer.  I am overall reassured by his unchanged MRI, normal exam and patient did feel improved after some pain control in the ED.  He is stable for discharge.      ____________________________________________   FINAL CLINICAL IMPRESSION(S) / ED DIAGNOSES  Final diagnoses:  Nonintractable headache, unspecified chronicity pattern, unspecified headache type  Tinnitus, unspecified laterality     ED Discharge Orders          Ordered    oxyCODONE-acetaminophen (PERCOCET) 5-325 MG tablet  Every 4 hours PRN  03/18/21 2106             Note:   This document was prepared using Dragon voice recognition software and may include unintentional dictation errors.    Rada Hay, MD 03/19/21 [phone]

## 2021-03-18 NOTE — Progress Notes (Signed)
IV started in right [facility] while in MRI and left in place

## 2021-03-18 NOTE — ED Provider Notes (Signed)
Emergency Medicine Provider Triage Evaluation Note  Jose Hayes , a 60 y.o. male  was evaluated in triage.  Pt complains of tinnitus and headaches with dizziness and nausea since Sunday. Stepped on a rusty nail Sunday afternoon, but did get Tdap booster.   Review of Systems  Positive: Dizziness, tinnitus, nausea, headache Negative: Cough, fever  Physical Exam  There were no vitals taken for this visit. Gen:   Awake, no distress   Resp:  Normal effort  MSK:   Moves extremities without difficulty  Other:    Medical Decision Making  Medically screening exam initiated at 2:28 PM.  Appropriate orders placed.  Jose Hayes was informed that the remainder of the evaluation will be completed by another provider, this initial triage assessment does not replace that evaluation, and the importance of remaining in the ED until their evaluation is complete.   Victorino Dike, FNP 03/18/21 1432    Blake Divine, MD 03/18/21 1910

## 2021-03-26 DIAGNOSIS — F411 Generalized anxiety disorder: Secondary | ICD-10-CM | POA: Diagnosis not present

## 2021-03-26 DIAGNOSIS — R69 Illness, unspecified: Secondary | ICD-10-CM | POA: Diagnosis not present

## 2021-05-08 ENCOUNTER — Encounter: Payer: Self-pay | Admitting: Family Medicine

## 2021-05-08 DIAGNOSIS — R059 Cough, unspecified: Secondary | ICD-10-CM

## 2021-05-08 NOTE — Telephone Encounter (Signed)
I called and spoke with patient he was willing to [place] was able to make him an appointment tomorrow at 12p for evaluation.

## 2021-05-09 ENCOUNTER — Other Ambulatory Visit: Payer: Self-pay

## 2021-05-09 ENCOUNTER — Ambulatory Visit (INDEPENDENT_AMBULATORY_CARE_PROVIDER_SITE_OTHER): Payer: Medicare HMO

## 2021-05-09 ENCOUNTER — Ambulatory Visit
Admission: RE | Admit: 2021-05-09 | Discharge: 2021-05-09 | Disposition: A | Discharge status: Home / Self Care | Payer: Medicare HMO | Source: Ambulatory Visit | Attending: Emergency Medicine | Admitting: Emergency Medicine

## 2021-05-09 VITALS — BP 148/90 | HR 82 | Temp 98.0°F | Resp 18

## 2021-05-09 DIAGNOSIS — R059 Cough, unspecified: Secondary | ICD-10-CM | POA: Diagnosis not present

## 2021-05-09 DIAGNOSIS — J189 Pneumonia, unspecified organism: Secondary | ICD-10-CM | POA: Diagnosis not present

## 2021-05-09 DIAGNOSIS — R0602 Shortness of breath: Secondary | ICD-10-CM | POA: Diagnosis not present

## 2021-05-09 MED ORDER — BENZONATATE 100 MG PO CAPS: 100.0000 mg | ORAL_CAPSULE | Freq: Three times a day (TID) | ORAL | 0 refills | Status: DC | PRN

## 2021-05-09 MED ORDER — AMOXICILLIN 875 MG PO TABS: 875.0000 mg | ORAL_TABLET | Freq: Two times a day (BID) | ORAL | 0 refills | Status: AC

## 2021-05-09 MED ORDER — AZITHROMYCIN 250 MG PO TABS: 250.0000 mg | ORAL_TABLET | Freq: Every day | ORAL | 0 refills | Status: DC

## 2021-05-09 NOTE — ED Triage Notes (Signed)
Pt here with 1 week of URI sx with a very persistent cough that keeps him up at night. Has tried OTC remedies with no relief. Pt has hx of adrenal insufficiency.

## 2021-05-09 NOTE — ED Provider Notes (Signed)
[facility]    CSN: [id number] Arrival date & time: 05/09/21  1154      History   Chief Complaint Chief Complaint  Patient presents with   Cough   Nasal Congestion   Fatigue    HPI DONTREZ PETTIS is a 60 y.o. male. Patient presents with productive cough, congestion, postnasal drip, and sore throat for >1 week.  He also reports mild shortness of breath with coughing.  He contacted his PCP and was instructed to come to urgent care.  No fever, rash, chest pain, vomiting, diarrhea, or other symptoms.  Treatment at home with Mucinex and OTC cold medication.  His medical history includes hypertension, former smoker, esophageal cancer, adrenal insufficiency.  The history is provided by the patient and medical records.   Past Medical History:  Diagnosis Date   Allergy    Anxiety    COVID-19    1/14, 05/24/20   Esophageal cancer ([place]) 10/21/2016   GE junction   GERD (gastroesophageal reflux disease)    Hyperglycemia    Hyperlipidemia    Hypertension    Sleep apnea    CPAP   SVT (supraventricular tachycardia) ([place])    2013   Thyroid disease    Tobacco abuse     Patient Active Problem List   Diagnosis Date Noted   Inguinal hernia 12/19/2020   Abdominal pain 11/12/2020   Colon cancer screening 06/11/2020   Adrenal insufficiency ([place]) 05/30/2020   Hypothyroidism 05/30/2020   Anxiety 05/30/2020   Gastroesophageal cancer ([place]) 11/02/2016   Kidney stones 10/13/2016   Restless leg syndrome 09/04/2015   Hyperlipidemia 12/01/2011   SVT (supraventricular tachycardia) ([place]) 12/01/2011   Obesity 12/01/2011   Hypertension 12/01/2011    Past Surgical History:  Procedure Laterality Date   COLONOSCOPY     [place] Right 1985, 1991, 2000   left x3   [place] teeth extraction     UPPER GASTROINTESTINAL ENDOSCOPY         Home Medications    Prior to Admission medications   Medication Sig Start Date End Date  Taking? Authorizing Provider  amoxicillin (AMOXIL) 875 MG tablet Take 1 tablet (875 mg total) by mouth 2 (two) times daily for 10 days. 05/09/21 05/19/21 Yes Sharion Balloon, NP  azithromycin (ZITHROMAX) 250 MG tablet Take 1 tablet (250 mg total) by mouth daily. Take first 2 tablets together, then 1 every day until finished. 05/09/21  Yes Sharion Balloon, NP  benzonatate (TESSALON) 100 MG capsule Take 1 capsule (100 mg total) by mouth 3 (three) times daily as needed for cough. 05/09/21  Yes Sharion Balloon, NP  albuterol (VENTOLIN HFA) 108 (90 Base) MCG/ACT inhaler TAKE 2 PUFFS BY MOUTH EVERY 6 HOURS AS NEEDED FOR WHEEZE OR SHORTNESS OF BREATH 06/16/20   McLean-Scocuzza, Nino Glow, MD  ALPRAZolam Duanne Moron) 1 MG tablet Take 1 mg by mouth 3 (three) times daily as needed for anxiety.    [provider]  amLODipine (NORVASC) 5 MG tablet TAKE 1 TABLET BY MOUTH EVERY DAY 12/26/20   Dunn, Areta Haber, PA-C  azelastine (ASTELIN) 0.1 % nasal spray Place into both nostrils 2 (two) times daily. Use in each nostril as directed    [provider]  fluticasone (FLONASE) 50 MCG/ACT nasal spray Place 2 sprays into both nostrils daily. 07/27/19   Leone Haven, MD  hydrocortisone (CORTEF) 10 MG tablet Take by mouth daily. Takes 1.5 tablet am daily and 1 tablet  pm qd.    [provider]  hydrOXYzine (ATARAX/VISTARIL) 10 MG tablet Take 10 mg by mouth 3 (three) times daily as needed for anxiety. 01/21/21   [provider]  levothyroxine (SYNTHROID) 88 MCG tablet Take 88 mcg by mouth daily before breakfast.    [provider]  rosuvastatin (CRESTOR) 20 MG tablet Take 1 tablet (20 mg total) by mouth daily. 06/19/20   Leone Haven, MD  testosterone cypionate (DEPOTESTOSTERONE CYPIONATE) 200 MG/ML injection Inject 0.38 mg into the muscle once a week. 03/26/20 03/18/21  [provider]  venlafaxine XR (EFFEXOR-XR) 150 MG 24 hr capsule Take 150 mg by mouth daily. 02/19/21   [provider]  verapamil (CALAN) 40 MG tablet Take 1 tablet (40 mg total) by mouth 3 (three) times daily as needed. 06/10/20   Minna Merritts, MD    Family History Family History  Problem Relation Age of Onset   Hypertension Mother    Hypertension Father    Pancreatic cancer Father    Clotting disorder Father        renal cell ca   Colon cancer Neg Hx    Esophageal cancer Neg Hx    Prostate cancer Neg Hx    Rectal cancer Neg Hx    Stomach cancer Neg Hx    Colon polyps Neg Hx     Social History Social History   Tobacco Use   Smoking status: Former    Packs/day: 0.50    Years: 25.00    Pack years: 12.50    Types: Cigarettes   Smokeless tobacco: Never   Tobacco comments:    trying gum to quit  Vaping Use   Vaping Use: Former  Substance Use Topics   Alcohol use: Not Currently    Comment: occasionally   Drug use: No     Allergies   Patient has no known allergies.   Review of Systems Review of Systems  Constitutional:  Negative for chills and fever.  HENT:  Positive for congestion, postnasal drip, rhinorrhea and sinus pressure. Negative for ear pain and sore throat.   Respiratory:  Positive for cough and shortness of breath.   Cardiovascular:  Negative for chest pain and palpitations.  Gastrointestinal:  Negative for diarrhea and vomiting.  Skin:  Negative for color change and rash.  All other systems reviewed and are negative.   Physical Exam Triage Vital Signs ED Triage Vitals  Enc Vitals Group     BP      Pulse      Resp      Temp      Temp src      SpO2      Weight      Height      Head Circumference      Peak Flow      Pain Score      Pain Loc      Pain Edu?      Excl. in [place]?    No data found.  Updated Vital Signs BP (!) 148/90    Pulse 82    Temp 98 F (36.7 C)    Resp 18    SpO2 95%   Visual Acuity Right Eye Distance:   Left Eye Distance:   Bilateral Distance:    Right Eye Near:   Left Eye Near:    Bilateral Near:      Physical Exam Vitals and nursing note reviewed.  Constitutional:      General:  He is not in acute distress.    Appearance: He is well-developed.  HENT:     Head: Normocephalic and atraumatic.     Right Ear: Tympanic membrane normal.     Left Ear: Tympanic membrane normal.     Nose: Congestion present.     Mouth/Throat:     Mouth: Mucous membranes are moist.     Pharynx: Oropharynx is clear.  Cardiovascular:     Rate and Rhythm: Normal rate and regular rhythm.     Heart sounds: Normal heart sounds.  Pulmonary:     Effort: Pulmonary effort is normal. No respiratory distress.     Breath sounds: Rhonchi present.     Comments: Scattered rhonchi throughout.   Abdominal:     Palpations: Abdomen is soft.     Tenderness: There is no abdominal tenderness.  Musculoskeletal:     Cervical back: Neck supple.  Skin:    General: Skin is warm and dry.  Neurological:     Mental Status: He is alert.  Psychiatric:        Mood and Affect: Mood normal.        Behavior: Behavior normal.     UC Treatments / Results  Labs (all labs ordered are listed, but only abnormal results are displayed) Labs Reviewed - No data to display  EKG   Radiology DG Chest 2 View  Result Date: 05/09/2021 CLINICAL DATA:  Productive cough and shortness of breath. EXAM: CHEST - 2 VIEW COMPARISON:  05/30/2020 FINDINGS: Right chest wall port a catheter noted with tip in the cavoatrial junction. Heart size appears normal. No pleural effusion or interstitial edema. Asymmetric opacity identified within the right upper lobe is identified concerning for pneumonia. Left lung appears clear. IMPRESSION: Asymmetric opacity within the right upper lobe compatible with pneumonia. Electronically Signed   By: Kerby Moors M.D.   On: 05/09/2021 12:59    Procedures Procedures (including critical care time)  Medications Ordered in UC Medications - No data to display  Initial Impression / Assessment and Plan / UC Course  I  have reviewed the triage vital signs and the nursing notes.  Pertinent labs & imaging results that were available during my care of the patient were reviewed by me and considered in my medical decision making (see chart for details).   Right upper lobe pneumonia.  Rhonchi noted on exam.  Chest x-ray shows right upper lobe pneumonia.  O2 sat 95% on room air.  Treating with Zithromax and amoxicillin.  Treating cough with Tessalon Perles.  Instructed patient to follow up with his PCP in 2-3 days.  ED precautions discussed.  Education provided on pneumonia.  Patient agrees to plan of care.   Final Clinical Impressions(s) / UC Diagnoses   Final diagnoses:  Pneumonia of right upper lobe due to infectious organism     Discharge Instructions      Take the Zithromax and amoxicillin as directed.  Follow up with your primary care provider in 2-3 days.  Go to the emergency department if you have acute shortness of breath or other concerning symptoms.          ED Prescriptions     Medication Sig Dispense Auth. Provider   azithromycin (ZITHROMAX) 250 MG tablet Take 1 tablet (250 mg total) by mouth daily. Take first 2 tablets together, then 1 every day until finished. 6 tablet Sharion Balloon, NP   amoxicillin (AMOXIL) 875 MG tablet Take 1 tablet (875 mg total) by mouth 2 (  two) times daily for 10 days. 20 tablet Sharion Balloon, NP   benzonatate (TESSALON) 100 MG capsule Take 1 capsule (100 mg total) by mouth 3 (three) times daily as needed for cough. 21 capsule Sharion Balloon, NP      PDMP not reviewed this encounter.   Sharion Balloon, NP 05/09/21 1309

## 2021-05-09 NOTE — Discharge Instructions (Addendum)
Take the Zithromax and amoxicillin as directed.  Follow up with your primary care provider in 2-3 days.  Go to the emergency department if you have acute shortness of breath or other concerning symptoms.

## 2021-05-10 LAB — NOVEL CORONAVIRUS, NAA: SARS-CoV-2, NAA: NOT DETECTED

## 2021-05-10 LAB — SARS-COV-2, NAA 2 DAY TAT

## 2021-05-13 ENCOUNTER — Other Ambulatory Visit: Payer: Self-pay

## 2021-05-13 NOTE — Addendum Note (Signed)
Addended by: Fulton Mole D on: 05/13/2021 11:24 AM   Modules accepted: Orders

## 2021-05-13 NOTE — Telephone Encounter (Signed)
Patient was called and scheduled for a follow up and a chest xray in 4 weeks per [place].  Donat Humble,cma

## 2021-05-18 DIAGNOSIS — E038 Other specified hypothyroidism: Secondary | ICD-10-CM | POA: Diagnosis not present

## 2021-05-18 DIAGNOSIS — C160 Malignant neoplasm of cardia: Secondary | ICD-10-CM | POA: Diagnosis not present

## 2021-05-18 DIAGNOSIS — Z87448 Personal history of other diseases of urinary system: Secondary | ICD-10-CM | POA: Diagnosis not present

## 2021-05-18 DIAGNOSIS — E2749 Other adrenocortical insufficiency: Secondary | ICD-10-CM | POA: Diagnosis not present

## 2021-05-18 DIAGNOSIS — C159 Malignant neoplasm of esophagus, unspecified: Secondary | ICD-10-CM | POA: Diagnosis not present

## 2021-05-18 DIAGNOSIS — R918 Other nonspecific abnormal finding of lung field: Secondary | ICD-10-CM | POA: Diagnosis not present

## 2021-05-18 DIAGNOSIS — E236 Other disorders of pituitary gland: Secondary | ICD-10-CM | POA: Diagnosis not present

## 2021-05-18 DIAGNOSIS — E291 Testicular hypofunction: Secondary | ICD-10-CM | POA: Diagnosis not present

## 2021-05-18 DIAGNOSIS — Z87442 Personal history of urinary calculi: Secondary | ICD-10-CM | POA: Diagnosis not present

## 2021-05-18 DIAGNOSIS — Z87898 Personal history of other specified conditions: Secondary | ICD-10-CM | POA: Diagnosis not present

## 2021-05-18 DIAGNOSIS — D333 Benign neoplasm of cranial nerves: Secondary | ICD-10-CM | POA: Diagnosis not present

## 2021-05-25 ENCOUNTER — Ambulatory Visit: Payer: Medicare HMO

## 2021-05-27 DIAGNOSIS — G4733 Obstructive sleep apnea (adult) (pediatric): Secondary | ICD-10-CM | POA: Diagnosis not present

## 2021-05-27 DIAGNOSIS — G4730 Sleep apnea, unspecified: Secondary | ICD-10-CM | POA: Diagnosis not present

## 2021-06-11 ENCOUNTER — Encounter: Payer: Self-pay | Admitting: Family Medicine

## 2021-06-12 ENCOUNTER — Other Ambulatory Visit: Payer: Medicare HMO

## 2021-06-12 DIAGNOSIS — E291 Testicular hypofunction: Secondary | ICD-10-CM | POA: Diagnosis not present

## 2021-06-22 ENCOUNTER — Ambulatory Visit (INDEPENDENT_AMBULATORY_CARE_PROVIDER_SITE_OTHER): Payer: Medicare HMO | Admitting: Family Medicine

## 2021-06-22 ENCOUNTER — Other Ambulatory Visit: Payer: Self-pay

## 2021-06-22 ENCOUNTER — Encounter: Payer: Self-pay | Admitting: Family Medicine

## 2021-06-22 VITALS — BP 130/80 | HR 81 | Temp 98.4°F | Ht 69.0 in | Wt 241.6 lb

## 2021-06-22 DIAGNOSIS — F419 Anxiety disorder, unspecified: Secondary | ICD-10-CM

## 2021-06-22 DIAGNOSIS — M79672 Pain in left foot: Secondary | ICD-10-CM | POA: Diagnosis not present

## 2021-06-22 DIAGNOSIS — H93A9 Pulsatile tinnitus, unspecified ear: Secondary | ICD-10-CM

## 2021-06-22 DIAGNOSIS — I10 Essential (primary) hypertension: Secondary | ICD-10-CM | POA: Diagnosis not present

## 2021-06-22 DIAGNOSIS — C160 Malignant neoplasm of cardia: Secondary | ICD-10-CM | POA: Diagnosis not present

## 2021-06-22 DIAGNOSIS — R69 Illness, unspecified: Secondary | ICD-10-CM | POA: Diagnosis not present

## 2021-06-22 NOTE — Assessment & Plan Note (Signed)
No evidence of recurrence of disease at this time based on recent scans.  He will continue to see his oncologist.

## 2021-06-22 NOTE — Patient Instructions (Signed)
Nice to see you. I am going to order a CT angiogram of your head and neck.  This will be to evaluate for a cause of the whooshing that you are hearing.  Please contact your ENT regarding that symptom as well. We will check a uric acid to evaluate for gout. Please follow-up with your psychiatrist.

## 2021-06-22 NOTE — Assessment & Plan Note (Signed)
Well-controlled.  He will continue amlodipine 5 mg once daily.

## 2021-06-22 NOTE — Assessment & Plan Note (Signed)
Continues to have issues with anxiety.  He is working with psychiatrist.  I encouraged him to continue to work on transitioning to a different medication.

## 2021-06-22 NOTE — Assessment & Plan Note (Signed)
This is an ongoing issue.  I advised that this could represent gout and/or osteoarthritis.  We will get uric acid today.  Discussed Tylenol for discomfort.

## 2021-06-22 NOTE — Progress Notes (Signed)
Tommi Rumps, MD Phone: [phone]  Jose Hayes is a 61 y.o. male who presents today for f/u.  HYPERTENSION Disease Monitoring Home BP Monitoring 125/80 Chest pain- no    Dyspnea- no Medications Compliance-  taking amlodipine.  Edema- no BMET    Component Value Date/Time   NA 137 03/18/2021 1611   NA 142 11/06/2011 0427   K 3.2 (L) 03/18/2021 1611   K 4.1 11/06/2011 0427   CL 106 03/18/2021 1611   CL 107 11/06/2011 0427   CO2 25 03/18/2021 1611   CO2 29 11/06/2011 0427   GLUCOSE 131 (H) 03/18/2021 1611   GLUCOSE 93 11/06/2011 0427   BUN 17 03/18/2021 1611   BUN 17 11/06/2011 0427   CREATININE 0.90 03/18/2021 1611   CREATININE 1.04 12/19/2020 1518   CALCIUM 8.2 (L) 03/18/2021 1611   CALCIUM 8.5 11/06/2011 0427   GFRNONAA >60 03/18/2021 1611   GFRNONAA >60 11/06/2011 0427   GFRAA >60 11/06/2011 0427   Pulsatile tinnitus: Patient notes onset of this back in November.  He was evaluated in the emergency room and had a reassuring MRI with and without contrast.  This revealed a stable IAC lesion on the left.  He notes continued intermittent whooshing that seems to coincide with his pulse.  He has not spoken with his ENT regarding this.  Left foot pain: Patient feels as though he may have arthritis at his left first MTP joint.  Notes at times he will develop sudden increase in extreme pain in the area.  When this occurs he cannot touch the joint.  There is no associated erythema.  He notes a baseline aching in the joint.  Anxiety: Patient continues to have issues with this periodically.  He tries to keep it under good control given his adrenal insufficiency.  He has had some sexual dysfunction with the venlafaxine.  He also notes REM sleep disorder where he has thrown himself out of bed and got up and punched the wall.  He has a psychiatrist and has spoken with them regarding this.  They are working on transitioning him off of the venlafaxine and onto something  else.  Gastroesophageal cancer: The patient recently followed up with his oncologist.  His scans did not reveal any evidence of recurrent disease.  Social History   Tobacco Use  Smoking Status Former   Packs/day: 0.50   Years: 25.00   Pack years: 12.50   Types: Cigarettes  Smokeless Tobacco Never  Tobacco Comments   trying gum to quit    Current Outpatient Medications on File Prior to Visit  Medication Sig Dispense Refill   albuterol (VENTOLIN HFA) 108 (90 Base) MCG/ACT inhaler TAKE 2 PUFFS BY MOUTH EVERY 6 HOURS AS NEEDED FOR WHEEZE OR SHORTNESS OF BREATH 18 g 1   ALPRAZolam (XANAX) 1 MG tablet Take 1 mg by mouth 3 (three) times daily as needed for anxiety.     amLODipine (NORVASC) 5 MG tablet TAKE 1 TABLET BY MOUTH EVERY DAY 90 tablet 1   azelastine (ASTELIN) 0.1 % nasal spray Place into both nostrils 2 (two) times daily. Use in each nostril as directed     azithromycin (ZITHROMAX) 250 MG tablet Take 1 tablet (250 mg total) by mouth daily. Take first 2 tablets together, then 1 every day until finished. 6 tablet 0   benzonatate (TESSALON) 100 MG capsule Take 1 capsule (100 mg total) by mouth 3 (three) times daily as needed for cough. 21 capsule 0   fluticasone (FLONASE)  50 MCG/ACT nasal spray Place 2 sprays into both nostrils daily. 16 g 6   hydrocortisone (CORTEF) 10 MG tablet Take by mouth daily. Takes 1.5 tablet am daily and 1 tablet pm qd.     hydrOXYzine (ATARAX/VISTARIL) 10 MG tablet Take 10 mg by mouth 3 (three) times daily as needed for anxiety.     levothyroxine (SYNTHROID) 88 MCG tablet Take 88 mcg by mouth daily before breakfast.     rosuvastatin (CRESTOR) 20 MG tablet Take 1 tablet (20 mg total) by mouth daily. 90 tablet 3   testosterone cypionate (DEPOTESTOSTERONE CYPIONATE) 200 MG/ML injection Inject into the muscle.     venlafaxine XR (EFFEXOR-XR) 150 MG 24 hr capsule Take 150 mg by mouth daily.     verapamil (CALAN) 40 MG tablet Take 1 tablet (40 mg total) by mouth 3  (three) times daily as needed. 90 tablet 1   No current facility-administered medications on file prior to visit.     ROS see history of present illness  Objective  Physical Exam Vitals:   06/22/21 0906  BP: 130/80  Pulse: 81  Temp: 98.4 F (36.9 C)  SpO2: 97%    BP Readings from Last 3 Encounters:  06/22/21 130/80  05/09/21 (!) 148/90  03/18/21 (!) 105/58   Wt Readings from Last 3 Encounters:  06/22/21 241 lb 9.6 oz (109.6 kg)  03/18/21 230 lb (104.3 kg)  12/19/20 224 lb 12.8 oz (102 kg)    Physical Exam Constitutional:      General: He is not in acute distress.    Appearance: He is not diaphoretic.  HENT:     Right Ear: Tympanic membrane normal.     Left Ear: Tympanic membrane normal.  Cardiovascular:     Rate and Rhythm: Normal rate and regular rhythm.     Heart sounds: Normal heart sounds.     Comments: No carotid bruits Pulmonary:     Effort: Pulmonary effort is normal.     Breath sounds: Normal breath sounds.  Musculoskeletal:     Comments: Left foot with no swelling, warmth, or erythema, no tenderness over the first MTP joint, reduced range of motion of the left first MTP joint  Skin:    General: Skin is warm and dry.  Neurological:     Mental Status: He is alert.     Assessment/Plan: Please see individual problem list.  Problem List Items Addressed This Visit     Anxiety    Continues to have issues with anxiety.  He is working with psychiatrist.  I encouraged him to continue to work on transitioning to a different medication.      Gastroesophageal cancer (HCC)    No evidence of recurrence of disease at this time based on recent scans.  He will continue to see his oncologist.      Hypertension    Well-controlled.  He will continue amlodipine 5 mg once daily.      Left foot pain    This is an ongoing issue.  I advised that this could represent gout and/or osteoarthritis.  We will get uric acid today.  Discussed Tylenol for discomfort.       Relevant Orders   Uric acid   Pulsatile tinnitus - Primary    Patient describes ongoing issues with pulsatile tinnitus.  He had a reassuring MRI with and without contrast and reassuring CT head.  Discussed that we need to evaluate the vasculature of his head and neck and a CT angiogram will  be ordered.  The patient will also discussed this symptom with his ear nose and throat physician.      Relevant Orders   CT ANGIO HEAD NECK W WO CM   Basic Metabolic Panel (BMET)    Return in about 6 months (around 12/20/2021).  This visit occurred during the SARS-CoV-2 public health emergency.  Safety protocols were in place, including screening questions prior to the visit, additional usage of staff PPE, and extensive cleaning of exam room while observing appropriate contact time as indicated for disinfecting solutions.    Tommi Rumps, MD [place]

## 2021-06-22 NOTE — Assessment & Plan Note (Signed)
Patient describes ongoing issues with pulsatile tinnitus.  He had a reassuring MRI with and without contrast and reassuring CT head.  Discussed that we need to evaluate the vasculature of his head and neck and a CT angiogram will be ordered.  The patient will also discussed this symptom with his ear nose and throat physician.

## 2021-06-22 NOTE — Assessment & Plan Note (Signed)
>>  ASSESSMENT AND PLAN FOR ANXIETY WRITTEN ON 06/22/2021  9:29 AM BY SONNENBERG, ERIC G, MD  Continues to have issues with anxiety.  He is working with psychiatrist.  I encouraged him to continue to work on transitioning to a different medication.

## 2021-06-23 LAB — URIC ACID: Uric Acid, Serum: 4.8 mg/dL (ref 4.0–7.8)

## 2021-06-23 LAB — BASIC METABOLIC PANEL
BUN: 23 mg/dL (ref 6–23)
CO2: 27 mEq/L (ref 19–32)
Calcium: 9.1 mg/dL (ref 8.4–10.5)
Chloride: 106 mEq/L (ref 96–112)
Creatinine, Ser: 1.05 mg/dL (ref 0.40–1.50)
GFR: 77.13 mL/min (ref >60.00)
Glucose, Bld: 97 mg/dL (ref 70–99)
Potassium: 3.8 mEq/L (ref 3.5–5.1)
Sodium: 139 mEq/L (ref 135–145)

## 2021-06-26 ENCOUNTER — Ambulatory Visit: Payer: Medicare HMO | Admitting: Family Medicine

## 2021-06-26 ENCOUNTER — Ambulatory Visit (INDEPENDENT_AMBULATORY_CARE_PROVIDER_SITE_OTHER): Payer: Medicare HMO

## 2021-06-26 VITALS — Ht 69.0 in | Wt 241.0 lb

## 2021-06-26 DIAGNOSIS — Z0000 Encounter for general adult medical examination without abnormal findings: Secondary | ICD-10-CM | POA: Diagnosis not present

## 2021-06-26 NOTE — Progress Notes (Signed)
Subjective:   Jose Hayes is a 61 y.o. male who presents for Medicare Annual/Subsequent preventive examination.  Review of Systems    No ROS.  Medicare Wellness Virtual Visit.  Visual/audio telehealth visit, UTA vital signs.   See social history for additional risk factors.   Cardiac Risk Factors include: advanced age (>27men, >76 women);male gender     Objective:    Today's Vitals   06/26/21 0955  Weight: 241 lb (109.3 kg)  Height: 5\' 9"  (1.753 m)   Body mass index is 35.59 kg/m.  Advanced Directives 06/26/2021 05/22/2020 11/02/2016 10/27/2016 02/10/2015 12/07/2013 11/23/2013  Does Patient Have a Medical Advance Directive? No Yes No No No Patient does not have advance directive Patient does not have advance directive  Does patient want to make changes to medical advance directive? - No - Patient declined - - - - -  Would patient like information on creating a medical advance directive? No - Patient declined - - - No - patient declined information - -    Current Medications (verified) Outpatient Encounter Medications as of 06/26/2021  Medication Sig   albuterol (VENTOLIN HFA) 108 (90 Base) MCG/ACT inhaler TAKE 2 PUFFS BY MOUTH EVERY 6 HOURS AS NEEDED FOR WHEEZE OR SHORTNESS OF BREATH   ALPRAZolam (XANAX) 1 MG tablet Take 1 mg by mouth 3 (three) times daily as needed for anxiety.   amLODipine (NORVASC) 5 MG tablet TAKE 1 TABLET BY MOUTH EVERY DAY   azelastine (ASTELIN) 0.1 % nasal spray Place into both nostrils 2 (two) times daily. Use in each nostril as directed   azithromycin (ZITHROMAX) 250 MG tablet Take 1 tablet (250 mg total) by mouth daily. Take first 2 tablets together, then 1 every day until finished.   benzonatate (TESSALON) 100 MG capsule Take 1 capsule (100 mg total) by mouth 3 (three) times daily as needed for cough.   fluticasone (FLONASE) 50 MCG/ACT nasal spray Place 2 sprays into both nostrils daily.   hydrocortisone (CORTEF) 10 MG tablet Take by mouth daily.  Takes 1.5 tablet am daily and 1 tablet pm qd.   hydrOXYzine (ATARAX/VISTARIL) 10 MG tablet Take 10 mg by mouth 3 (three) times daily as needed for anxiety.   levothyroxine (SYNTHROID) 88 MCG tablet Take 88 mcg by mouth daily before breakfast.   rosuvastatin (CRESTOR) 20 MG tablet Take 1 tablet (20 mg total) by mouth daily.   testosterone cypionate (DEPOTESTOSTERONE CYPIONATE) 200 MG/ML injection Inject into the muscle.   venlafaxine XR (EFFEXOR-XR) 150 MG 24 hr capsule Take 150 mg by mouth daily.   verapamil (CALAN) 40 MG tablet Take 1 tablet (40 mg total) by mouth 3 (three) times daily as needed.   No facility-administered encounter medications on file as of 06/26/2021.    Allergies (verified) Patient has no known allergies.   History: Past Medical History:  Diagnosis Date   Allergy    Anxiety    COVID-19    1/14, 05/24/20   Esophageal cancer ([place]) 10/21/2016   GE junction   GERD (gastroesophageal reflux disease)    Hyperglycemia    Hyperlipidemia    Hypertension    Sleep apnea    CPAP   SVT (supraventricular tachycardia) ([place])    2013   Thyroid disease    Tobacco abuse    Past Surgical History:  Procedure Laterality Date   COLONOSCOPY     LAPAROSCOPIC CHOLECYSTECTOMY  1991   SHOULDER SURGERY Right 1985, 1991, 2000   left x3   [place] teeth  extraction     UPPER GASTROINTESTINAL ENDOSCOPY     Family History  Problem Relation Age of Onset   Hypertension Mother    Hypertension Father    Pancreatic cancer Father    Clotting disorder Father        renal cell ca   Colon cancer Neg Hx    Esophageal cancer Neg Hx    Prostate cancer Neg Hx    Rectal cancer Neg Hx    Stomach cancer Neg Hx    Colon polyps Neg Hx    Social History   Socioeconomic History   Marital status: Married    Spouse name: Not on file   Number of children: Not on file   Years of education: Not on file   Highest education level: Not on file  Occupational History   Not on file  Tobacco Use    Smoking status: Former    Packs/day: 0.50    Years: 25.00    Pack years: 12.50    Types: Cigarettes   Smokeless tobacco: Never   Tobacco comments:    trying gum to quit  Vaping Use   Vaping Use: Former  Substance and Sexual Activity   Alcohol use: Not Currently    Comment: occasionally   Drug use: No   Sexual activity: Not on file  Other Topics Concern   Not on file  Social History Narrative   Not on file   Social Determinants of Health   Financial Resource Strain: Low Risk    Difficulty of Paying Living Expenses: Not hard at all  Food Insecurity: No Food Insecurity   Worried About Charity fundraiser in the Last Year: Never true   Ran Out of Food in the Last Year: Never true  Transportation Needs: No Transportation Needs   Lack of Transportation (Medical): No   Lack of Transportation (Non-Medical): No  Physical Activity: Not on file  Stress: No Stress Concern Present   Feeling of Stress : Only a little  Social Connections: Unknown   Frequency of Communication with Friends and Family: Not on file   Frequency of Social Gatherings with Friends and Family: Not on file   Attends Religious Services: Not on Electrical engineer or Organizations: Not on file   Attends Archivist Meetings: Not on file   Marital Status: Married    Tobacco Counseling Counseling given: Not Answered Tobacco comments: trying gum to quit   Clinical Intake:  Pre-visit preparation completed: Yes        Diabetes: No  How often do you need to have someone help you when you read instructions, pamphlets, or other written materials from your doctor or pharmacy?: 1 - Never    Interpreter Needed?: No    Activities of Daily Living In your present state of health, do you have any difficulty performing the following activities: 06/26/2021  Hearing? N  Comment Tinnitus. Followed by Oncology and Audiology. Annual follow up.  Vision? N  Difficulty concentrating or making  decisions? N  Walking or climbing stairs? Y  Comment Chronic R knee pain. Paces self.  Dressing or bathing? N  Doing errands, shopping? Y  Comment Family assist with driving as needed  Preparing Food and eating ? N  Using the Toilet? N  In the past six months, have you accidently leaked urine? N  Do you have problems with loss of bowel control? N  Managing your Medications? N  Managing your Finances? N  Housekeeping or managing your Housekeeping? N  Some recent data might be hidden   Patient Care Team: Leone Haven, MD as PCP - General (Family Medicine) Rockey Situ, Kathlene November, MD as PCP - Cardiology (Cardiology) Minna Merritts, MD as Consulting Physician (Cardiology)  Indicate any recent Medical Services you may have received from other than [facility] providers in the past year (date may be approximate).     Assessment:   This is a routine wellness examination for Jc.  Virtual Visit via Telephone Note  I connected with  Lionel December on 06/26/21 at  9:45 AM EST by telephone and verified that I am speaking with the correct person using two identifiers.  Persons participating in the virtual visit: patient/Nurse Health Advisor   I discussed the limitations, risks, security and privacy concerns of performing an evaluation and management service by telephone and the availability of in person appointments. The patient expressed understanding and agreed to proceed.  Interactive audio and video telecommunications were attempted between this nurse and patient, however failed, due to patient having technical difficulties OR patient did not have access to video capability.  We continued and completed visit with audio only.  Some vital signs may be absent or patient reported.   Hearing/Vision screen Hearing Screening - Comments:: Followed by [facility] Hearing loss  No hearing aids in use Vision Screening - Comments:: Wears readers  They have seen their ophthalmologist in  the last 12 months.   Dietary issues and exercise activities discussed: Current Exercise Habits: Home exercise routine, Type of exercise: walking, Intensity: Moderate Healthy diet   Goals Addressed             This Visit's Progress    Follow up with pcp       As needed       Depression Screen PHQ 2/9 Scores 06/26/2021 06/22/2021 12/19/2020 05/22/2020 07/13/2019 01/09/2019 12/22/2016  PHQ - 2 Score - 0 0 - 0 0 0  PHQ- 9 Score - - - - - 0 -  Exception Documentation Other- indicate reason in comment box - - Other- indicate reason in comment box - - -  Not completed Followed by Psychiatry every one month - - - - - -    Fall Risk Fall Risk  06/26/2021 06/22/2021 05/30/2020 05/22/2020 07/13/2019  Falls in the past year? 0 0 0 0 0  Number falls in past yr: 0 0 0 0 0  Injury with Fall? - 0 0 0 -  Risk for fall due to : - No Fall Risks - - -  Follow up Falls evaluation completed Falls evaluation completed Falls evaluation completed Falls evaluation completed Falls evaluation completed   [place]: Home free of loose throw rugs in walkways, pet beds, electrical cords, etc? Yes  Adequate lighting in your home to reduce risk of falls? Yes   ASSISTIVE DEVICES UTILIZED TO PREVENT FALLS: Life alert? No  Use of a cane, walker or w/c? No   TIMED UP AND GO: Was the test performed? No .   Cognitive Function:  Patient is alert and oriented x3.    Immunizations Immunization History  Administered Date(s) Administered   Influenza,inj,Quad PF,6+ Mos 02/03/2017, 02/13/2018, 01/11/2019   Influenza-Unspecified 04/09/2020   PFIZER(Purple Top)SARS-COV-2 Vaccination 08/06/2019, 08/27/2019   Tetanus 03/16/2021   Shingrix Completed?: No.    Education has been provided regarding the importance of this vaccine. Patient has been advised to call insurance company to determine out of  pocket expense if they have not yet received this vaccine. Advised may also receive vaccine at  local pharmacy or Health Dept. Verbalized acceptance and understanding.  Screening Tests Health Maintenance  Topic Date Due   COVID-19 Vaccine (3 - Pfizer risk series) 07/12/2021 (Originally 09/24/2019)   HIV Screening  07/25/2021 (Originally 12/04/1975)   INFLUENZA VACCINE  08/07/2021 (Originally 12/08/2020)   Zoster Vaccines- Shingrix (1 of 2) 09/23/2021 (Originally 12/04/1979)   Hepatitis C Screening  06/26/2022 (Originally 12/04/1978)   COLONOSCOPY (Pts 45-46yrs Insurance coverage will need to be confirmed)  08/14/2023   TETANUS/TDAP  03/17/2031   HPV VACCINES  Aged Out   Health Maintenance There are no preventive care reminders to display for this patient.  DG CT Chest 2 view: completed 05/09/21.   Hepatitis C Screening: deferred.   Vision Screening: Recommended annual ophthalmology exams for early detection of glaucoma and other disorders of the eye.  Dental Screening: Recommended annual dental exams for proper oral hygiene  Community Resource Referral / Chronic Care Management: CRR required this visit?  No   CCM required this visit?  No      Plan:   Keep all routine maintenance appointments.   I have personally reviewed and noted the following in the patients chart:   Medical and social history Use of alcohol, tobacco or illicit drugs  Current medications and supplements including opioid prescriptions. Patient is not currently taking opioid prescriptions. Functional ability and status Nutritional status Physical activity Advanced directives List of other physicians Hospitalizations, surgeries, and ER visits in previous 12 months Vitals Screenings to include cognitive, depression, and falls Referrals and appointments  In addition, I have reviewed and discussed with patient certain preventive protocols, quality metrics, and best practice recommendations. A written personalized care plan for preventive services as well as general preventive health recommendations were  provided to patient via mychart.     Varney Biles, LPN   [id number]

## 2021-06-26 NOTE — Patient Instructions (Addendum)
Jose Hayes , Thank you for taking time to come for your Medicare Wellness Visit. I appreciate your ongoing commitment to your health goals. Please review the following plan we discussed and let me know if I can assist you in the future.   These are the goals we discussed:  Goals      Follow up with pcp     As needed        This is a list of the screening recommended for you and due dates:  Health Maintenance  Topic Date Due   COVID-19 Vaccine (3 - Pfizer risk series) 07/12/2021*   HIV Screening  07/25/2021*   Flu Shot  08/07/2021*   Zoster (Shingles) Vaccine (1 of 2) 09/23/2021*   Hepatitis C Screening: USPSTF Recommendation to screen - Ages 18-79 yo.  06/26/2022*   Colon Cancer Screening  08/14/2023   Tetanus Vaccine  03/17/2031   HPV Vaccine  Aged Out  *Topic was postponed. The date shown is not the original due date.    Advanced directives: not yet completed  Conditions/risks identified: none new  Next appointment: Follow up in one year for your annual wellness visit   Preventive Care 40-64 Years, Male Preventive care refers to lifestyle choices and visits with your health care provider that can promote health and wellness. What does preventive care include? A yearly physical exam. This is also called an annual well check. Dental exams once or twice a year. Routine eye exams. Ask your health care provider how often you should have your eyes checked. Personal lifestyle choices, including: Daily care of your teeth and gums. Regular physical activity. Eating a healthy diet. Avoiding tobacco and drug use. Limiting alcohol use. Practicing safe sex. Taking low-dose aspirin every day starting at age 51. What happens during an annual well check? The services and screenings done by your health care provider during your annual well check will depend on your age, overall health, lifestyle risk factors, and family history of disease. Counseling  Your health care provider  may ask you questions about your: Alcohol use. Tobacco use. Drug use. Emotional well-being. Home and relationship well-being. Sexual activity. Eating habits. Work and work Statistician. Screening  You may have the following tests or measurements: Height, weight, and BMI. Blood pressure. Lipid and cholesterol levels. These may be checked every 5 years, or more frequently if you are over 68 years old. Skin check. Lung cancer screening. You may have this screening every year starting at age 78 if you have a 30-pack-year history of smoking and currently smoke or have quit within the past 15 years. Fecal occult blood test (FOBT) of the stool. You may have this test every year starting at age 50. Flexible sigmoidoscopy or colonoscopy. You may have a sigmoidoscopy every 5 years or a colonoscopy every 10 years starting at age 43. Prostate cancer screening. Recommendations will vary depending on your family history and other risks. Hepatitis C blood test. Hepatitis B blood test. Sexually transmitted disease (STD) testing. Diabetes screening. This is done by checking your blood sugar (glucose) after you have not eaten for a while (fasting). You may have this done every 1-3 years. Discuss your test results, treatment options, and if necessary, the need for more tests with your health care provider. Vaccines  Your health care provider may recommend certain vaccines, such as: Influenza vaccine. This is recommended every year. Tetanus, diphtheria, and acellular pertussis (Tdap, Td) vaccine. You may need a Td booster every 10 years. Zoster vaccine. You  may need this after age 84. Pneumococcal 13-valent conjugate (PCV13) vaccine. You may need this if you have certain conditions and have not been vaccinated. Pneumococcal polysaccharide (PPSV23) vaccine. You may need one or two doses if you smoke cigarettes or if you have certain conditions. Talk to your health care provider about which screenings and  vaccines you need and how often you need them. This information is not intended to replace advice given to you by your health care provider. Make sure you discuss any questions you have with your health care provider. Document Released: 05/23/2015 Document Revised: 01/14/2016 Document Reviewed: 02/25/2015 Elsevier Interactive Patient Education  2017 [place] Prevention in the Home Falls can cause injuries. They can happen to people of all ages. There are many things you can do to make your home safe and to help prevent falls. What can I do on the outside of my home? Regularly fix the edges of walkways and driveways and fix any cracks. Remove anything that might make you trip as you walk through a door, such as a raised step or threshold. Trim any bushes or trees on the path to your home. Use bright outdoor lighting. Clear any walking paths of anything that might make someone trip, such as rocks or tools. Regularly check to see if handrails are loose or broken. Make sure that both sides of any steps have handrails. Any raised decks and porches should have guardrails on the edges. Have any leaves, snow, or ice cleared regularly. Use sand or salt on walking paths during winter. Clean up any spills in your garage right away. This includes oil or grease spills. What can I do in the bathroom? Use night lights. Install grab bars by the toilet and in the tub and shower. Do not use towel bars as grab bars. Use non-skid mats or decals in the tub or shower. If you need to sit down in the shower, use a plastic, non-slip stool. Keep the floor dry. Clean up any water that spills on the floor as soon as it happens. Remove soap buildup in the tub or shower regularly. Attach bath mats securely with double-sided non-slip rug tape. Do not have throw rugs and other things on the floor that can make you trip. What can I do in the bedroom? Use night lights. Make sure that you have a light by your  bed that is easy to reach. Do not use any sheets or blankets that are too big for your bed. They should not hang down onto the floor. Have a firm chair that has side arms. You can use this for support while you get dressed. Do not have throw rugs and other things on the floor that can make you trip. What can I do in the kitchen? Clean up any spills right away. Avoid walking on wet floors. Keep items that you use a lot in easy-to-reach places. If you need to reach something above you, use a strong step stool that has a grab bar. Keep electrical cords out of the way. Do not use floor polish or wax that makes floors slippery. If you must use wax, use non-skid floor wax. Do not have throw rugs and other things on the floor that can make you trip. What can I do with my stairs? Do not leave any items on the stairs. Make sure that there are handrails on both sides of the stairs and use them. Fix handrails that are broken or loose. Make sure that  handrails are as long as the stairways. Check any carpeting to make sure that it is firmly attached to the stairs. Fix any carpet that is loose or worn. Avoid having throw rugs at the top or bottom of the stairs. If you do have throw rugs, attach them to the floor with carpet tape. Make sure that you have a light switch at the top of the stairs and the bottom of the stairs. If you do not have them, ask someone to add them for you. What else can I do to help prevent falls? Wear shoes that: Do not have high heels. Have rubber bottoms. Are comfortable and fit you well. Are closed at the toe. Do not wear sandals. If you use a stepladder: Make sure that it is fully opened. Do not climb a closed stepladder. Make sure that both sides of the stepladder are locked into place. Ask someone to hold it for you, if possible. Clearly mark and make sure that you can see: Any grab bars or handrails. First and last steps. Where the edge of each step is. Use tools that  help you move around (mobility aids) if they are needed. These include: Canes. Walkers. Scooters. Crutches. Turn on the lights when you go into a dark area. Replace any light bulbs as soon as they burn out. Set up your furniture so you have a clear path. Avoid moving your furniture around. If any of your floors are uneven, fix them. If there are any pets around you, be aware of where they are. Review your medicines with your doctor. Some medicines can make you feel dizzy. This can increase your chance of falling. Ask your doctor what other things that you can do to help prevent falls. This information is not intended to replace advice given to you by your health care provider. Make sure you discuss any questions you have with your health care provider. Document Released: 02/20/2009 Document Revised: 10/02/2015 Document Reviewed: 05/31/2014 Elsevier Interactive Patient Education  2017 [employer].

## 2021-06-30 ENCOUNTER — Telehealth: Payer: Self-pay | Admitting: Family Medicine

## 2021-06-30 NOTE — Telephone Encounter (Signed)
I called the patient and informed him that he is scheduled to be seen tomorrow and he could get his knee checked out then, he did not realize he was scheduled for tomorrow and he will be here.  Evalynne Locurto,cma

## 2021-06-30 NOTE — Telephone Encounter (Signed)
Pt called in stating that he dislocated his right knee cap but he is unsure. Oncologist want him to have it evaluated.

## 2021-06-30 NOTE — Telephone Encounter (Signed)
Noted.  We will plan to evaluate then.  Please see what symptoms he is having with his knee as he stated he thinks he dislocated his kneecap.  He may need to be seen sooner than tomorrow if he did dislocate his kneecap.

## 2021-07-01 ENCOUNTER — Ambulatory Visit (INDEPENDENT_AMBULATORY_CARE_PROVIDER_SITE_OTHER): Payer: Medicare HMO | Admitting: Family Medicine

## 2021-07-01 ENCOUNTER — Ambulatory Visit (INDEPENDENT_AMBULATORY_CARE_PROVIDER_SITE_OTHER): Payer: Medicare HMO

## 2021-07-01 ENCOUNTER — Encounter: Payer: Self-pay | Admitting: Family Medicine

## 2021-07-01 ENCOUNTER — Other Ambulatory Visit: Payer: Self-pay

## 2021-07-01 DIAGNOSIS — M25561 Pain in right knee: Secondary | ICD-10-CM | POA: Diagnosis not present

## 2021-07-01 MED ORDER — HYDROCODONE-ACETAMINOPHEN 5-325 MG PO TABS: 1.0000 | ORAL_TABLET | Freq: Four times a day (QID) | ORAL | 0 refills | Status: DC | PRN

## 2021-07-01 NOTE — Assessment & Plan Note (Signed)
The patient has had a likely patella dislocation that reduced on its own.  He has some continued discomfort though it is somewhat improved.  We will get an x-ray today.  We will treat his pain with Vicodin.  He was counseled on risk of drowsiness and respiratory depression with this.  He will not mix this with his Xanax.  If he gets excessively drowsy with this medication he will discontinue it.  We will refer to orthopedics.

## 2021-07-01 NOTE — Progress Notes (Signed)
Tommi Rumps, MD Phone: [phone]  ROBERTS BON is a 61 y.o. male who presents today for same day visit.   Right knee pain: Patient notes about a week and a half ago he was walking and turned suddenly and felt a pop in his knee.  The outside of his leg went numb with this.  He notes for a few days after that he had trouble walking.  Since then he has been walking gingerly.  He notes it hurts with weightbearing.  He believes his kneecap dislocated.  He notes this occurred 6 to 7 months ago with similar symptoms and was diagnosed with a dislocated kneecap.  He also notes intermittently hyperextending his knee over the last 6 to 7 months.  He was taking a narcotic pain medication for this pain as he cannot take NSAIDs given his gastroesophageal cancer history.  Social History   Tobacco Use  Smoking Status Former   Packs/day: 0.50   Years: 25.00   Pack years: 12.50   Types: Cigarettes  Smokeless Tobacco Never  Tobacco Comments   trying gum to quit    Current Outpatient Medications on File Prior to Visit  Medication Sig Dispense Refill   albuterol (VENTOLIN HFA) 108 (90 Base) MCG/ACT inhaler TAKE 2 PUFFS BY MOUTH EVERY 6 HOURS AS NEEDED FOR WHEEZE OR SHORTNESS OF BREATH 18 g 1   ALPRAZolam (XANAX) 1 MG tablet Take 1 mg by mouth 3 (three) times daily as needed for anxiety.     amLODipine (NORVASC) 5 MG tablet TAKE 1 TABLET BY MOUTH EVERY DAY 90 tablet 1   azelastine (ASTELIN) 0.1 % nasal spray Place into both nostrils 2 (two) times daily. Use in each nostril as directed     azithromycin (ZITHROMAX) 250 MG tablet Take 1 tablet (250 mg total) by mouth daily. Take first 2 tablets together, then 1 every day until finished. 6 tablet 0   benzonatate (TESSALON) 100 MG capsule Take 1 capsule (100 mg total) by mouth 3 (three) times daily as needed for cough. 21 capsule 0   fluticasone (FLONASE) 50 MCG/ACT nasal spray Place 2 sprays into both nostrils daily. 16 g 6   hydrocortisone  (CORTEF) 10 MG tablet Take by mouth daily. Takes 1.5 tablet am daily and 1 tablet pm qd.     hydrOXYzine (ATARAX/VISTARIL) 10 MG tablet Take 10 mg by mouth 3 (three) times daily as needed for anxiety.     levothyroxine (SYNTHROID) 88 MCG tablet Take 88 mcg by mouth daily before breakfast.     rosuvastatin (CRESTOR) 20 MG tablet Take 1 tablet (20 mg total) by mouth daily. 90 tablet 3   testosterone cypionate (DEPOTESTOSTERONE CYPIONATE) 200 MG/ML injection Inject into the muscle.     verapamil (CALAN) 40 MG tablet Take 1 tablet (40 mg total) by mouth 3 (three) times daily as needed. 90 tablet 1   venlafaxine XR (EFFEXOR-XR) 150 MG 24 hr capsule Take 150 mg by mouth daily. (Patient not taking: Reported on 07/01/2021)     No current facility-administered medications on file prior to visit.     ROS see history of present illness  Objective  Physical Exam Vitals:   07/01/21 1116  BP: 130/80  Pulse: 68  Resp: 14  Temp: 98.2 F (36.8 C)  SpO2: 98%    BP Readings from Last 3 Encounters:  07/01/21 130/80  06/22/21 130/80  05/09/21 (!) 148/90   Wt Readings from Last 3 Encounters:  07/01/21 240 lb 12.8 oz (109.2 kg)  06/26/21 241 lb (109.3 kg)  06/22/21 241 lb 9.6 oz (109.6 kg)    Physical Exam Musculoskeletal:     Comments: Right knee with slight soft tissue swelling compared to the left knee, in the right knee there is no ACL or PCL laxity, no MCL or LCL laxity, his patella does not seem to be overly mobile, there is slight medial joint line tenderness on the right     Assessment/Plan: Please see individual problem list.  Problem List Items Addressed This Visit     Right knee pain    The patient has had a likely patella dislocation that reduced on its own.  He has some continued discomfort though it is somewhat improved.  We will get an x-ray today.  We will treat his pain with Vicodin.  He was counseled on risk of drowsiness and respiratory depression with this.  He will not  mix this with his Xanax.  If he gets excessively drowsy with this medication he will discontinue it.  We will refer to orthopedics.      Relevant Medications   HYDROcodone-acetaminophen (NORCO/VICODIN) 5-325 MG tablet   Other Relevant Orders   Ambulatory referral to Orthopedic Surgery   DG Knee Complete 4 Views Right     Return if symptoms worsen or fail to improve.  This visit occurred during the SARS-CoV-2 public health emergency.  Safety protocols were in place, including screening questions prior to the visit, additional usage of staff PPE, and extensive cleaning of exam room while observing appropriate contact time as indicated for disinfecting solutions.    Tommi Rumps, MD [place]

## 2021-07-01 NOTE — Patient Instructions (Signed)
Nice to see. We will get an x-ray today to evaluate your knee. You can use the Vicodin for pain as needed as prescribed.  This could make you drowsy and does have the potential to reduce your respiratory drive.  If you have any issues while taking this medication you should discontinue it.  If you have issues with your breathing while taking it you need to seek medical attention. If you have recurrent patellar dislocation you need to go to the emergency room.

## 2021-07-02 ENCOUNTER — Other Ambulatory Visit: Payer: Self-pay

## 2021-07-02 ENCOUNTER — Encounter: Payer: Self-pay | Admitting: Family Medicine

## 2021-07-02 DIAGNOSIS — M25561 Pain in right knee: Secondary | ICD-10-CM

## 2021-07-03 ENCOUNTER — Telehealth: Payer: Self-pay

## 2021-07-03 MED ORDER — HYDROCODONE-ACETAMINOPHEN 5-325 MG PO TABS: 1.0000 | ORAL_TABLET | Freq: Four times a day (QID) | ORAL | 0 refills | Status: DC | PRN

## 2021-07-03 NOTE — Telephone Encounter (Signed)
I sent this to another pharmacy for him already. Mychart message sent to patient regarding that.

## 2021-07-05 ENCOUNTER — Other Ambulatory Visit: Payer: Self-pay | Admitting: Physician Assistant

## 2021-07-06 ENCOUNTER — Other Ambulatory Visit: Payer: Self-pay

## 2021-07-06 ENCOUNTER — Ambulatory Visit
Admission: RE | Admit: 2021-07-06 | Discharge: 2021-07-06 | Disposition: A | Discharge status: Home / Self Care | Payer: Medicare HMO | Source: Ambulatory Visit | Attending: Family Medicine | Admitting: Family Medicine

## 2021-07-06 DIAGNOSIS — H93A9 Pulsatile tinnitus, unspecified ear: Secondary | ICD-10-CM | POA: Insufficient documentation

## 2021-07-06 DIAGNOSIS — M47812 Spondylosis without myelopathy or radiculopathy, cervical region: Secondary | ICD-10-CM | POA: Diagnosis not present

## 2021-07-06 DIAGNOSIS — Q283 Other malformations of cerebral vessels: Secondary | ICD-10-CM | POA: Diagnosis not present

## 2021-07-06 MED ORDER — IOHEXOL 350 MG/ML SOLN
75.0000 mL | Freq: Once | INTRAVENOUS | Status: AC | PRN
  Administered 2021-07-06: 75 mL via INTRAVENOUS

## 2021-07-09 ENCOUNTER — Other Ambulatory Visit: Payer: Self-pay

## 2021-07-09 ENCOUNTER — Ambulatory Visit: Payer: Self-pay

## 2021-07-09 ENCOUNTER — Ambulatory Visit: Payer: Medicare HMO | Admitting: Orthopedic Surgery

## 2021-07-09 DIAGNOSIS — M79604 Pain in right leg: Secondary | ICD-10-CM

## 2021-07-10 ENCOUNTER — Encounter: Payer: Self-pay | Admitting: Orthopedic Surgery

## 2021-07-10 NOTE — Progress Notes (Signed)
? ?Office Visit Note ?  ?Patient: Jose Hayes           ?Date of Birth: [birth year]           ?MRN: [id number] ?Visit Date: 07/09/2021 ?Requested by: Leone Haven, MD ?[address] ?[id number] ?[place],  [place] [address] ?PCP: Leone Haven, MD ? ?Subjective: ?Chief Complaint  ?Patient presents with  ?? Right Leg - Pain  ? ? ?HPI: [name] is a 61 year old active retired patient who is on disability for stage IV stomach cancer which is in remission.  Last chemo was about 2-1/2 years ago.  Patient describes an injury to his right knee about 9 months ago.  He was treated by other providers at that time but by his history he reports patellar dislocation with spontaneous reduction.  He has had hyper extend the knee since that time about once a day.  He states that 6 weeks ago he felt "something go" in the knee.  Since then he has been limping.  His original injury occurred while he was standing on the side of a pond and his foot slipped out and his patella dislocated.  For that he tried some physical therapy with some relief but then started having "hyperextension issues".  He reports some low back pain as well as episodic numbness and tingling in the leg.  He states that his knee pain wakes him up from sleep at night 3 times a night.  Outside radiographs are reviewed and shows mild medial compartment arthritis but joint space is relatively well-maintained in all 3 compartments. ?             ?ROS: All systems reviewed are negative as they relate to the chief complaint within the history of present illness.  Patient denies  fevers or chills. ? ? ?Assessment & Plan: ?Visit Diagnoses:  ?1. Pain in right leg   ? ? ?Plan: Impression is right knee pain with effusion.  His patella feels stable on my exam.  Extensor mechanism intact.  He has some guarding on exam but I think he may have some ACL laxity and or meniscal pathology which is preventing full extension.  Based on presence of night pain along with history  of cancer and asymmetric laxity right knee versus left in a guarded knee exam MRI scan is indicated to evaluate meniscal tear and/or ACL tear.  Follow-up after that study ? ?Follow-Up Instructions: No follow-ups on file.  ? ?Orders:  ?Orders Placed This Encounter  ?Procedures  ?? MR Knee Right w/o contrast  ? ?No orders of the defined types were placed in this encounter. ? ? ? ? Procedures: ?No procedures performed ? ? ?Clinical Data: ?No additional findings. ? ?Objective: ?Vital Signs: There were no vitals taken for this visit. ? ?Physical Exam:  ? ?Constitutional: Patient appears well-developed ?HEENT:  ?Head: Normocephalic ?Eyes:EOM are normal ?Neck: Normal range of motion ?Cardiovascular: Normal rate ?Pulmonary/chest: Effort normal ?Neurologic: Patient is alert ?Skin: Skin is warm ?Psychiatric: Patient has normal mood and affect ? ? ?Ortho Exam: Ortho exam demonstrates full active and passive range of motion of the hip and ankle on the right.  Pedal pulses palpable.  No calf tenderness negative Homans.  Extensor mechanism intact.  Lacks about 70 degrees of full extension on the right knee compared to the left.  Collaterals are stable to varus and valgus stress at 0 and 30 degrees.  Does have anterolateral joint line posterior lateral joint line tenderness.  There is  some ACL laxity but he is guarding with Lachman and anterior drawer.  That assessment is a little bit more difficult to make.  Not too much asymmetric patellar instability laterally right versus left. ? ?Specialty Comments:  ?No specialty comments available. ? ?Imaging: ?No results found. ? ? ?PMFS History: ?Patient Active Problem List  ? Diagnosis Date Noted  ?? Right knee pain 07/01/2021  ?? Pulsatile tinnitus 06/22/2021  ?? Left foot pain 06/22/2021  ?? Inguinal hernia 12/19/2020  ?? Adrenal insufficiency ([facility]) 05/30/2020  ?? Hypothyroidism 05/30/2020  ?? Anxiety 05/30/2020  ?? Gastroesophageal cancer ([facility]) 11/02/2016  ?? Kidney stones 10/13/2016   ?? Restless leg syndrome 09/04/2015  ?? Hyperlipidemia 12/01/2011  ?? SVT (supraventricular tachycardia) ([facility]) 12/01/2011  ?? Obesity 12/01/2011  ?? Hypertension 12/01/2011  ? ?Past Medical History:  ?Diagnosis Date  ?? Allergy   ?? Anxiety   ?? COVID-19   ? 1/14, 05/24/20  ?? Esophageal cancer ([facility]) 10/21/2016  ? GE junction  ?? GERD (gastroesophageal reflux disease)   ?? Hyperglycemia   ?? Hyperlipidemia   ?? Hypertension   ?? Sleep apnea   ? CPAP  ?? SVT (supraventricular tachycardia) ([facility])   ? 2013  ?? Thyroid disease   ?? Tobacco abuse   ?  ?Family History  ?Problem Relation Age of Onset  ?? Hypertension Mother   ?? Hypertension Father   ?? Pancreatic cancer Father   ?? Clotting disorder Father   ?     renal cell ca  ?? Colon cancer Neg Hx   ?? Esophageal cancer Neg Hx   ?? Prostate cancer Neg Hx   ?? Rectal cancer Neg Hx   ?? Stomach cancer Neg Hx   ?? Colon polyps Neg Hx   ?  ?Past Surgical History:  ?Procedure Laterality Date  ?? COLONOSCOPY    ?? LAPAROSCOPIC CHOLECYSTECTOMY  1991  ?? [facility], 2000  ? left x3  ?? [place] teeth extraction    ?? UPPER GASTROINTESTINAL ENDOSCOPY    ? ?Social History  ? ?Occupational History  ?? Not on file  ?Tobacco Use  ?? Smoking status: Former  ?  Packs/day: 0.50  ?  Years: 25.00  ?  Pack years: 12.50  ?  Types: Cigarettes  ?? Smokeless tobacco: Never  ?? Tobacco comments:  ?  trying gum to quit  ?Vaping Use  ?? Vaping Use: Former  ?Substance and Sexual Activity  ?? Alcohol use: Not Currently  ?  Comment: occasionally  ?? Drug use: No  ?? Sexual activity: Not on file  ? ? ? ? ? ?

## 2021-07-14 ENCOUNTER — Other Ambulatory Visit: Payer: Self-pay

## 2021-07-14 ENCOUNTER — Encounter: Payer: Self-pay | Admitting: Cardiovascular Disease

## 2021-07-14 ENCOUNTER — Ambulatory Visit: Payer: Medicare HMO | Admitting: Cardiovascular Disease

## 2021-07-14 VITALS — BP 130/80 | HR 64 | Ht 69.0 in | Wt 242.4 lb

## 2021-07-14 DIAGNOSIS — E7800 Pure hypercholesterolemia, unspecified: Secondary | ICD-10-CM

## 2021-07-14 DIAGNOSIS — E782 Mixed hyperlipidemia: Secondary | ICD-10-CM

## 2021-07-14 DIAGNOSIS — I10 Essential (primary) hypertension: Secondary | ICD-10-CM | POA: Diagnosis not present

## 2021-07-14 NOTE — Patient Instructions (Signed)
Medication Instructions:  ?No changes ? ?If you need a refill on your cardiac medications before your next appointment, please call your pharmacy.  ? ?Lab work: ?Lipids, LFTs ? ? ?Testing/Procedures: ?No new testing needed ? ?Follow-Up: ?At [facility], you and your health needs are our priority.  As part of our continuing mission to provide you with exceptional heart care, we have created designated Provider Care Teams.  These Care Teams include your primary Cardiologist (physician) and Advanced Practice Providers (APPs -  Physician Assistants and Nurse Practitioners) who all work together to provide you with the care you need, when you need it. ? ?You will need a follow up appointment in 12 months ? ?Providers on your designated Care Team:   ?Murray Hodgkins, NP ?Christell Faith, PA-C ?Cadence Kathlen Mody, PA-C ? ?COVID-19 Vaccine Information can be found at: [email or web address] For questions related to vaccine distribution or appointments, please email [email or web address] or call [phone].  ? ?

## 2021-07-14 NOTE — Progress Notes (Signed)
Cardiology Office Note ? ?Date:  07/14/2021  ? ?ID:  Jose Hayes, DOB 05-Jul-1960, MRN [id number] ? ?PCP:  Leone Haven, MD  ? ?Chief Complaint  ?Patient presents with  ? 1 year follow up   ?  Patient was diagnosed with pulsatile tinnitus and was told to follow up with cardiology. Medications reviewed by the patient verbally.   ? ? ?HPI:  ?[name] is a very pleasant 61 year-old gentleman with  ?obesity,  ?smoking history, quit  ?[facility] 11/05/2011 with SVT ?Rhythm broke to normal sinus rhythm in the emergency room. Adenosine may have worked ?Stomach cancer, completed chemotherapy, developed adrenal and thyroid issues ?He presents for routine followup of his arrhythmia/SVT ? ?In follow-up, reports doing relatively well ?03/2021: pulsatile tinnitus, with standing ?Worse with turning head to the side and back to center ?Dizzy, nausea in 2022 ?"Brain has adjusted" ? ?Recent imaging reviewed ?CT head ?Mild chronic microvascular ischemic changes in the cerebral white ?matter, as above. ?Large mucosal retention cysts or polyps in the maxillary sinuses ?bilaterally (right greater than left). ? ?MRI ?Large mucous retention cysts in the bilateral ?maxillary sinuses. Mild mucosal thickening in the ethmoid air cells ?and frontal sinuses.  ?Acoutic neuroma ? ?Denies any tachypalpitations concerning for arrhythmia ?[place], active ?Tolerating Crestor, no recent lipids available ? ?EKG personally reviewed by myself on todays visit ?Normal sinus rhythm rate 64 bpm no significant ST-T wave changes ? ?Other past medical history reviewed ?Completed stomach cancer therapy,  ?"knocked out pituitary" ?On supplement therapy, adrenal, thyroid, testerosterone ?Completed chemotherapy through [facility] clinical trial ? ?Fevers, fatigue daily last year ?H/A, tinnitus, tumors followed by MRI ?Possible acoustic neuroma vs mets ? ?Retired from  Engineer, technical sales at [employer]. ?On long term disability ? ?Medical fatigue, depleting  cortisol ?Tried to treat with steroids ?Seeing therapist, celexa/xanax ? ?Covid Jan 2022, "got through it" ?Ab infusion ? ?Event monitor: ?NSR, 1 run SVT ?Triggered events (11) , one associated with short run of SVT, other triggers not associated with significant arrhythmia. ? ?Echocardiogram was essentially normal with normal ejection fraction, normal biventricular systolic pressures. ? ? ?PMH:   has a past medical history of Allergy, Anxiety, COVID-19, Esophageal cancer ([place]) (10/21/2016), GERD (gastroesophageal reflux disease), Hyperglycemia, Hyperlipidemia, Hypertension, Sleep apnea, SVT (supraventricular tachycardia) ([place]), Thyroid disease, and Tobacco abuse. ? ?PSH:    ?Past Surgical History:  ?Procedure Laterality Date  ? COLONOSCOPY    ? LAPAROSCOPIC CHOLECYSTECTOMY  1991  ? [place], 2000  ? left x3  ? swidom teeth extraction    ? UPPER GASTROINTESTINAL ENDOSCOPY    ? ? ?Current Outpatient Medications  ?Medication Sig Dispense Refill  ? albuterol (VENTOLIN HFA) 108 (90 Base) MCG/ACT inhaler TAKE 2 PUFFS BY MOUTH EVERY 6 HOURS AS NEEDED FOR WHEEZE OR SHORTNESS OF BREATH 18 g 1  ? ALPRAZolam (XANAX) 1 MG tablet Take 1 mg by mouth 3 (three) times daily as needed for anxiety.    ? amLODipine (NORVASC) 5 MG tablet TAKE 1 TABLET BY MOUTH EVERY DAY 30 tablet 0  ? azelastine (ASTELIN) 0.1 % nasal spray Place into both nostrils 2 (two) times daily. Use in each nostril as directed    ? fluticasone (FLONASE) 50 MCG/ACT nasal spray Place 2 sprays into both nostrils daily. 16 g 6  ? HYDROcodone-acetaminophen (NORCO/VICODIN) 5-325 MG tablet Take 1 tablet by mouth every 6 (six) hours as needed for moderate pain. 20 tablet 0  ? hydrocortisone (CORTEF) 10 MG tablet  Take by mouth daily. Takes 1.5 tablet am daily and 1 tablet pm qd.    ? levothyroxine (SYNTHROID) 88 MCG tablet Take 88 mcg by mouth daily before breakfast.    ? rosuvastatin (CRESTOR) 20 MG tablet Take 1 tablet (20 mg total) by mouth  daily. 90 tablet 3  ? testosterone cypionate (DEPOTESTOSTERONE CYPIONATE) 200 MG/ML injection Inject into the muscle.    ? verapamil (CALAN) 40 MG tablet Take 1 tablet (40 mg total) by mouth 3 (three) times daily as needed. 90 tablet 1  ? azithromycin (ZITHROMAX) 250 MG tablet Take 1 tablet (250 mg total) by mouth daily. Take first 2 tablets together, then 1 every day until finished. (Patient not taking: Reported on 07/14/2021) 6 tablet 0  ? benzonatate (TESSALON) 100 MG capsule Take 1 capsule (100 mg total) by mouth 3 (three) times daily as needed for cough. (Patient not taking: Reported on 07/14/2021) 21 capsule 0  ? hydrOXYzine (ATARAX/VISTARIL) 10 MG tablet Take 10 mg by mouth 3 (three) times daily as needed for anxiety. (Patient not taking: Reported on 07/14/2021)    ? venlafaxine XR (EFFEXOR-XR) 150 MG 24 hr capsule Take 150 mg by mouth daily. (Patient not taking: Reported on 07/14/2021)    ? ?No current facility-administered medications for this visit.  ? ? ?Allergies:   Patient has no known allergies.  ? ?Social History:  The patient  reports that he has quit smoking. His smoking use included cigarettes. He has a 12.50 pack-year smoking history. He has never used smokeless tobacco. He reports that he does not currently use alcohol. He reports that he does not use drugs.  ? ?Family History:   family history includes Clotting disorder in his father; Hypertension in his father and mother; Pancreatic cancer in his father.  ? ? ?Review of Systems: ?Review of Systems  ?Constitutional: Negative.   ?HENT: Negative.    ?Respiratory: Negative.    ?Cardiovascular: Negative.   ?Gastrointestinal: Negative.   ?Musculoskeletal: Negative.   ?Neurological: Negative.   ?Psychiatric/Behavioral: Negative.    ?All other systems reviewed and are negative. ? ?PHYSICAL EXAM: ?VS:  BP 130/80 (BP Location: Left Arm, Patient Position: Sitting, Cuff Size: Normal)   Pulse 64   Ht '5\' 9"'$  (1.753 m)   Wt 242 lb 6 oz (109.9 kg)   SpO2 98%   BMI  35.79 kg/m?  , BMI Body mass index is 35.79 kg/m?Marland Kitchen ?Constitutional:  oriented to person, place, and time. No distress.  ?HENT:  ?Head: Grossly normal ?Eyes:  no discharge. No scleral icterus.  ?Neck: No JVD, no carotid bruits  ?Cardiovascular: Regular rate and rhythm, no murmurs appreciated ?Pulmonary/Chest: Clear to auscultation bilaterally, no wheezes or rails ?Abdominal: Soft.  no distension.  no tenderness.  ?Musculoskeletal: Normal range of motion ?Neurological:  normal muscle tone. Coordination normal. No atrophy ?Skin: Skin warm and dry ?Psychiatric: normal affect, pleasant ? ?Recent Labs: ?03/18/2021: ALT 19; Hemoglobin 14.4; Platelets 188 ?06/22/2021: BUN 23; Creatinine, Ser 1.05; Potassium 3.8; Sodium 139  ? ? ?Lipid Panel ?Lab Results  ?Component Value Date  ? CHOL 194 10/29/2020  ? HDL 43.70 10/29/2020  ? LDLCALC 134 (H) 10/29/2020  ? TRIG 83.0 10/29/2020  ? ?  ? ?Wt Readings from Last 3 Encounters:  ?07/14/21 242 lb 6 oz (109.9 kg)  ?07/01/21 240 lb 12.8 oz (109.2 kg)  ?06/26/21 241 lb (109.3 kg)  ?  ? ?ASSESSMENT AND PLAN: ? ?SVT (supraventricular tachycardia) ([place]) ?Denies tachyarrhythmia, no changes to his medications ? ?Mixed  hyperlipidemia ?We have ordered lipid panel and LFTs ? ?Stomach cancer ?Treated at [facility], clinical trial ?Completed his treatment, now in remission ?Reports no recurrence ? ?Essential hypertension ?Blood pressure is well controlled on today's visit. No changes made to the medications. ? ?Smoking ?Quit years ago ? ?Pulsatile tinnitus ?Does not appear to be a cardiac etiology, blood pressure well controlled ?Has a cyst, sinus issues ? ? Total encounter time more than 30 minutes ? Greater than 50% was spent in counseling and coordination of care with the patient ? ? ?Orders Placed This Encounter  ?Procedures  ? Lipid panel  ? Hepatic function panel  ? EKG 12-Lead  ? ? ? ?Signed, ?Esmond Plants, M.D., Ph.D. ?07/14/2021  ?[place] ?[phone] ? ?

## 2021-07-15 ENCOUNTER — Ambulatory Visit
Admission: RE | Admit: 2021-07-15 | Discharge: 2021-07-15 | Disposition: A | Discharge status: Home / Self Care | Payer: Medicare HMO | Source: Ambulatory Visit | Attending: Orthopedic Surgery | Admitting: Orthopedic Surgery

## 2021-07-15 DIAGNOSIS — R600 Localized edema: Secondary | ICD-10-CM | POA: Diagnosis not present

## 2021-07-15 DIAGNOSIS — M79604 Pain in right leg: Secondary | ICD-10-CM

## 2021-07-15 LAB — LIPID PANEL
Chol/HDL Ratio: 3.3 ratio (ref 0.0–5.0)
Cholesterol, Total: 169 mg/dL (ref 100–199)
HDL: 52 mg/dL (ref >39)
LDL Chol Calc (NIH): 107 mg/dL — ABNORMAL HIGH (ref 0–99)
Triglycerides: 51 mg/dL (ref 0–149)
VLDL Cholesterol Cal: 10 mg/dL (ref 5–40)

## 2021-07-15 LAB — HEPATIC FUNCTION PANEL
ALT: 20 IU/L (ref 0–44)
AST: 26 IU/L (ref 0–40)
Albumin: 5.1 g/dL — ABNORMAL HIGH (ref 3.8–4.9)
Alkaline Phosphatase: 64 IU/L (ref 44–121)
Bilirubin Total: 0.6 mg/dL (ref 0.0–1.2)
Bilirubin, Direct: 0.15 mg/dL (ref 0.00–0.40)
Total Protein: 7.3 g/dL (ref 6.0–8.5)

## 2021-07-24 ENCOUNTER — Ambulatory Visit: Payer: Medicare HMO | Admitting: Orthopedic Surgery

## 2021-07-24 ENCOUNTER — Encounter: Payer: Self-pay | Admitting: Orthopedic Surgery

## 2021-07-24 ENCOUNTER — Other Ambulatory Visit: Payer: Self-pay

## 2021-07-24 DIAGNOSIS — S83251D Bucket-handle tear of lateral meniscus, current injury, right knee, subsequent encounter: Secondary | ICD-10-CM | POA: Diagnosis not present

## 2021-07-24 DIAGNOSIS — M2341 Loose body in knee, right knee: Secondary | ICD-10-CM | POA: Diagnosis not present

## 2021-07-24 NOTE — Progress Notes (Signed)
? ?Office Visit Note ?  ?Patient: Jose Hayes           ?Date of Birth: 1960-11-29           ?MRN: [id number] ?Visit Date: 07/24/2021 ?Requested by: Jose Haven, MD ?[address] ?[id number] ?[place],  [place] [id number] ?PCP: Jose Haven, MD ? ?Subjective: ?Chief Complaint  ?Patient presents with  ? Other  ?   ?Scan review  ? ? ?HPI: [name] is a 61 year old patient who sustained an injury to his knee several months ago.  Since he was last seen he has had an MRI scan which shows loose body in the knee near the anterior lateral aspect of the lateral meniscus.  He also has a lateral meniscal tear.  ACL intact and the MPFL also appears to be intact.  He does describe episodes of "instability" in the knee.  Hard for him to sit.  Does have a history of cancer.  No history of DVT.  Wife is at home.  Has history of adrenal insufficiency.  We do have instructions from his endocrinologist as to how to manage that adrenal insufficiency in the perioperative period he does live [place] a farm. ?             ?ROS: All systems reviewed are negative as they relate to the chief complaint within the history of present illness.  Patient denies  fevers or chills. ? ? ?Assessment & Plan: ?Visit Diagnoses:  ?1. Loose body in knee, right knee   ?2. Bucket-handle tear of lateral meniscus of right knee as current injury, subsequent encounter   ? ? ?Plan: Impression is lateral meniscal tear and loose body in the right knee.  Plan is right knee arthroscopy with loose body removal and debridement of that lateral meniscal tear.  The risk and benefits of the procedure are discussed with the patient including not limited to infection nerve vessel damage incomplete pain relief as well as knee stiffness.  Perioperative steroids will be required because of his adrenal insufficiency.  Patient understands the risk and benefits as well as the expected rehabilitative course.  All questions answered. ? ?Follow-Up Instructions: No follow-ups  on file.  ? ?Orders:  ?No orders of the defined types were placed in this encounter. ? ?No orders of the defined types were placed in this encounter. ? ? ? ? Procedures: ?No procedures performed ? ? ?Clinical Data: ?No additional findings. ? ?Objective: ?Vital Signs: There were no vitals taken for this visit. ? ?Physical Exam:  ? ?Constitutional: Patient appears well-developed ?HEENT:  ?Head: Normocephalic ?Eyes:EOM are normal ?Neck: Normal range of motion ?Cardiovascular: Normal rate ?Pulmonary/chest: Effort normal ?Neurologic: Patient is alert ?Skin: Skin is warm ?Psychiatric: Patient has normal mood and affect ? ? ?Ortho Exam: Ortho exam demonstrates full active and passive range of motion of the right knee with mild effusion present.  Collateral and cruciate ligaments are stable.  No patellar apprehension.  No groin pain with internal or external rotation of the leg.  Has lateral greater than medial joint line tenderness with palpable pedal pulses no calf tenderness negative Homans. ? ?Specialty Comments:  ?No specialty comments available. ? ?Imaging: ?No results found. ? ? ?PMFS History: ?Patient Active Problem List  ? Diagnosis Date Noted  ? Right knee pain 07/01/2021  ? Pulsatile tinnitus 06/22/2021  ? Left foot pain 06/22/2021  ? Inguinal hernia 12/19/2020  ? Adrenal insufficiency ([place]) 05/30/2020  ? Hypothyroidism 05/30/2020  ? Anxiety 05/30/2020  ?  Gastroesophageal cancer ([place]) 11/02/2016  ? Kidney stones 10/13/2016  ? Restless leg syndrome 09/04/2015  ? Hyperlipidemia 12/01/2011  ? SVT (supraventricular tachycardia) ([place]) 12/01/2011  ? Obesity 12/01/2011  ? Hypertension 12/01/2011  ? ?Past Medical History:  ?Diagnosis Date  ? Allergy   ? Anxiety   ? COVID-19   ? 1/14, 05/24/20  ? Esophageal cancer ([place]) 10/21/2016  ? GE junction  ? GERD (gastroesophageal reflux disease)   ? Hyperglycemia   ? Hyperlipidemia   ? Hypertension   ? Sleep apnea   ? CPAP  ? SVT (supraventricular tachycardia) ([place])   ? 2013  ?  Thyroid disease   ? Tobacco abuse   ?  ?Family History  ?Problem Relation Age of Onset  ? Hypertension Mother   ? Hypertension Father   ? Pancreatic cancer Father   ? Clotting disorder Father   ?     renal cell ca  ? Colon cancer Neg Hx   ? Esophageal cancer Neg Hx   ? Prostate cancer Neg Hx   ? Rectal cancer Neg Hx   ? Stomach cancer Neg Hx   ? Colon polyps Neg Hx   ?  ?Past Surgical History:  ?Procedure Laterality Date  ? COLONOSCOPY    ? LAPAROSCOPIC CHOLECYSTECTOMY  1991  ? [place], 2000  ? left x3  ? swidom teeth extraction    ? UPPER GASTROINTESTINAL ENDOSCOPY    ? ?Social History  ? ?Occupational History  ? Not on file  ?Tobacco Use  ? Smoking status: Former  ?  Packs/day: 0.50  ?  Years: 25.00  ?  Pack years: 12.50  ?  Types: Cigarettes  ? Smokeless tobacco: Never  ? Tobacco comments:  ?  trying gum to quit  ?Vaping Use  ? Vaping Use: Former  ?Substance and Sexual Activity  ? Alcohol use: Not Currently  ?  Comment: occasionally  ? Drug use: No  ? Sexual activity: Not on file  ? ? ? ? ? ?

## 2021-07-24 NOTE — Telephone Encounter (Signed)
I made a note of this in patient's chart as a sticky note on epic

## 2021-07-25 NOTE — Telephone Encounter (Signed)
Awesome thx - Debbie he does not need preop anesthesia eval thx

## 2021-07-28 ENCOUNTER — Other Ambulatory Visit: Payer: Self-pay

## 2021-07-28 ENCOUNTER — Encounter (HOSPITAL_COMMUNITY): Payer: Self-pay | Admitting: Orthopedic Surgery

## 2021-07-28 NOTE — Anesthesia Preprocedure Evaluation (Addendum)
Anesthesia Evaluation  ?Patient identified by MRN, date of birth, ID band ?Patient awake ? ? ? ?Reviewed: ?Allergy & Precautions, NPO status , Patient's Chart, lab work & pertinent test results ? ?Airway ?Mallampati: II ? ?TM Distance: >3 FB ?Neck ROM: Full ? ? ? Dental ? ?(+) Dental Advisory Given, Teeth Intact ?  ?Pulmonary ?sleep apnea , former smoker,  ?  ?Pulmonary exam normal ?breath sounds clear to auscultation ? ? ? ? ? ? Cardiovascular ?hypertension, Pt. on medications ?Normal cardiovascular exam ?Rhythm:Regular Rate:Normal ? ? ?  ?Neuro/Psych ?PSYCHIATRIC DISORDERS Anxiety negative neurological ROS ?   ? GI/Hepatic ?Neg liver ROS, GERD  ,  ?Endo/Other  ?Hypothyroidism Morbid obesity ? Renal/GU ?Renal disease  ? ?  ?Musculoskeletal ?negative musculoskeletal ROS ?(+)  ? Abdominal ?(+) + obese,   ?Peds ? Hematology ?negative hematology ROS ?(+)   ?Anesthesia Other Findings ? ? Reproductive/Obstetrics ? ?  ? ? ? ? ? ? ? ? ? ? ? ? ? ?  ?  ? ? ? ? ? ? ?Anesthesia Physical ?Anesthesia Plan ? ?ASA: 3 ? ?Anesthesia Plan: General  ? ?Post-op Pain Management: Tylenol PO (pre-op)* and Celebrex PO (pre-op)*  ? ?Induction: Intravenous ? ?PONV Risk Score and Plan: 3 and Ondansetron, Treatment may vary due to age or medical condition, Dexamethasone and Midazolam ? ?Airway Management Planned: LMA ? ?Additional Equipment:  ? ?Intra-op Plan:  ? ?Post-operative Plan: Extubation in OR ? ?Informed Consent: I have reviewed the patients History and Physical, chart, labs and discussed the procedure including the risks, benefits and alternatives for the proposed anesthesia with the patient or authorized representative who has indicated his/her understanding and acceptance.  ? ? ? ?Dental advisory given ? ?Plan Discussed with: CRNA ? ?Anesthesia Plan Comments: (Patient with history of adrenal insufficiency.  He provided these instructions regarding perioperative management: ? ?[facility]in[facility]: ?Dr Sydell Axon ?[place] ?[address] ?[place], [place] [address] ?[phone] ?Instructions:  ?100 mg of IV hydrocortisone at the time of the incision ?Postop day 1--->50 mg of hydrocortisone twice a day ?Postop day 2--->25 mg of hydrocortisone twice a day ?Postop day 3---> can switch to sick day dosing for 2 to 3 days ?When patient reaches baseline activity and resume home dose ?)  ? ? ? ?Anesthesia Quick Evaluation ? ?

## 2021-07-28 NOTE — Progress Notes (Signed)
PCP - Dr. Caryl Bis ?Cardiologist - Dr. Rockey Situ  ?Endocrinology - Dr. Sydell Axon ?EKG - 07/14/21 ?Chest x-ray - 05/09/21 ?ECHO - 12/02/11 ?Cardiac Cath -  ?CPAP - CPAP  ? ?ERAS Protcol - clears 11:00  ?COVID TEST- n/a ? ?Anesthesia review: yes ? ?------------- ? ?SDW INSTRUCTIONS: ? ?Your procedure is scheduled on Thursday 3/23. Please [facility] to [facility] Main Entrance "A" at 1130 A.M., and check in at the Admitting office. Call this number if you have problems the morning of surgery: [phone] ? ? ?Remember: Do not eat after midnight the night before your surgery ? ?You may drink clear liquids until 11:00 AM the morning of your surgery.   ?Clear liquids allowed are: Water, Non-Citrus Juices (without pulp), Carbonated Beverages, Clear Tea, Black Coffee Only, and Gatorade (do Not add anything to drinks) ?  ?Medications to take morning of surgery with a sip of water include: ?ALPRAZolam Duanne Moron) if needed ?amLODipine (NORVASC)  ?HYDROcodone-acetaminophen (NORCO/VICODIN) if needed ?levothyroxine (SYNTHROID)  ?rosuvastatin (CRESTOR) ?verapamil (CALAN) if needed ?hydrocortisone (CORTEF)  ? ?As of today, STOP taking any Aspirin (unless otherwise instructed by your surgeon), Aleve, Naproxen, Ibuprofen, Motrin, Advil, Goody's, BC's, all herbal medications, fish oil, and all vitamins. ? ?  ?The Morning of Surgery ?Do not wear jewelry ?Do not wear lotions, powders, colognes, or deodorant ?Do not bring valuables to the hospital. ?[facility] is not responsible for any belongings or valuables. ? ?If you are a smoker, DO NOT Smoke 24 hours prior to surgery ? ?If you wear a CPAP at night please bring your mask the morning of surgery  ? ?Remember that you must have someone to transport you home after your surgery, and remain with you for 24 hours if you are discharged the same day. ? ?Please bring cases for contacts, glasses, hearing aids, dentures or bridgework because it cannot be worn into surgery.  ? ?Patients discharged  the day of surgery will not be allowed to drive home.  ? ?Please shower the NIGHT BEFORE/MORNING OF SURGERY (use antibacterial soap like DIAL soap if possible). Wear comfortable clothes the morning of surgery. Oral Hygiene is also important to reduce your risk of infection.  Remember - BRUSH YOUR TEETH THE MORNING OF SURGERY WITH YOUR REGULAR TOOTHPASTE ? ?Patient denies shortness of breath, fever, cough and chest pain.  ? ? ?   ? ?

## 2021-07-29 ENCOUNTER — Telehealth: Payer: Self-pay

## 2021-07-29 MED ORDER — ROSUVASTATIN CALCIUM 20 MG PO TABS: 20.0000 mg | ORAL_TABLET | Freq: Every day | ORAL | 3 refills | Status: DC

## 2021-07-29 NOTE — Telephone Encounter (Signed)
I received a fax from [place] [facility] stating that the patient is now using [facility] and he is eligible for a 100-day supply of his prescriptions at the same cost of a 90 day supply. He would like new prescriptions for Rosuvastatin 20 mg  and hydrocodone/APAP 5-325 mg. They stated if you have any questions you can call them [phone].  Tayanna Talford,cma  ?

## 2021-07-29 NOTE — Telephone Encounter (Signed)
I sent the crestor in. I do not provide refills of narcotics for that duration of time. The hydrocodone is not a chronic medication for him. It was prescribed for acute use with his knee pain.  ?

## 2021-07-30 ENCOUNTER — Ambulatory Visit (HOSPITAL_BASED_OUTPATIENT_CLINIC_OR_DEPARTMENT_OTHER): Payer: Medicare HMO | Admitting: Physician Assistant

## 2021-07-30 ENCOUNTER — Encounter (HOSPITAL_COMMUNITY): Payer: Self-pay | Admitting: Orthopedic Surgery

## 2021-07-30 ENCOUNTER — Encounter (HOSPITAL_COMMUNITY)
Admission: RE | Disposition: A | Discharge status: Home / Self Care | Payer: Self-pay | Source: Home / Self Care | Attending: Orthopedic Surgery

## 2021-07-30 ENCOUNTER — Other Ambulatory Visit: Payer: Self-pay

## 2021-07-30 ENCOUNTER — Ambulatory Visit (HOSPITAL_COMMUNITY): Payer: Medicare HMO | Admitting: Physician Assistant

## 2021-07-30 ENCOUNTER — Ambulatory Visit (HOSPITAL_COMMUNITY)
Admission: RE | Admit: 2021-07-30 | Discharge: 2021-07-30 | Disposition: A | Discharge status: Home / Self Care | Payer: Medicare HMO | Attending: Orthopedic Surgery | Admitting: Orthopedic Surgery

## 2021-07-30 DIAGNOSIS — I10 Essential (primary) hypertension: Secondary | ICD-10-CM

## 2021-07-30 DIAGNOSIS — S83271A Complex tear of lateral meniscus, current injury, right knee, initial encounter: Secondary | ICD-10-CM

## 2021-07-30 DIAGNOSIS — R69 Illness, unspecified: Secondary | ICD-10-CM | POA: Diagnosis not present

## 2021-07-30 DIAGNOSIS — Z79899 Other long term (current) drug therapy: Secondary | ICD-10-CM | POA: Diagnosis not present

## 2021-07-30 DIAGNOSIS — Z6833 Body mass index (BMI) 33.0-33.9, adult: Secondary | ICD-10-CM | POA: Diagnosis not present

## 2021-07-30 DIAGNOSIS — F419 Anxiety disorder, unspecified: Secondary | ICD-10-CM | POA: Insufficient documentation

## 2021-07-30 DIAGNOSIS — G4730 Sleep apnea, unspecified: Secondary | ICD-10-CM | POA: Insufficient documentation

## 2021-07-30 DIAGNOSIS — Z87891 Personal history of nicotine dependence: Secondary | ICD-10-CM | POA: Insufficient documentation

## 2021-07-30 DIAGNOSIS — E2740 Unspecified adrenocortical insufficiency: Secondary | ICD-10-CM | POA: Diagnosis not present

## 2021-07-30 DIAGNOSIS — S83271D Complex tear of lateral meniscus, current injury, right knee, subsequent encounter: Secondary | ICD-10-CM

## 2021-07-30 DIAGNOSIS — E039 Hypothyroidism, unspecified: Secondary | ICD-10-CM | POA: Insufficient documentation

## 2021-07-30 DIAGNOSIS — S83281A Other tear of lateral meniscus, current injury, right knee, initial encounter: Secondary | ICD-10-CM | POA: Diagnosis not present

## 2021-07-30 DIAGNOSIS — X58XXXA Exposure to other specified factors, initial encounter: Secondary | ICD-10-CM | POA: Diagnosis not present

## 2021-07-30 DIAGNOSIS — M2341 Loose body in knee, right knee: Secondary | ICD-10-CM

## 2021-07-30 DIAGNOSIS — Z01818 Encounter for other preprocedural examination: Secondary | ICD-10-CM

## 2021-07-30 HISTORY — PX: KNEE ARTHROSCOPY: SHX127

## 2021-07-30 LAB — BASIC METABOLIC PANEL
Anion gap: 11 (ref 5–15)
BUN: 9 mg/dL (ref 6–20)
CO2: 24 mmol/L (ref 22–32)
Calcium: 8.8 mg/dL — ABNORMAL LOW (ref 8.9–10.3)
Chloride: 106 mmol/L (ref 98–111)
Creatinine, Ser: 1.05 mg/dL (ref 0.61–1.24)
GFR, Estimated: >60 mL/min (ref >60)
Glucose, Bld: 103 mg/dL — ABNORMAL HIGH (ref 70–99)
Potassium: 4.1 mmol/L (ref 3.5–5.1)
Sodium: 141 mmol/L (ref 135–145)

## 2021-07-30 LAB — CBC
HCT: 43.1 % (ref 39.0–52.0)
Hemoglobin: 14.8 g/dL (ref 13.0–17.0)
MCH: 29.7 pg (ref 26.0–34.0)
MCHC: 34.3 g/dL (ref 30.0–36.0)
MCV: 86.4 fL (ref 80.0–100.0)
Platelets: 194 10*3/uL (ref 150–400)
RBC: 4.99 MIL/uL (ref 4.22–5.81)
RDW: 13.8 % (ref 11.5–15.5)
WBC: 5.8 10*3/uL (ref 4.0–10.5)
nRBC: 0 % (ref 0.0–0.2)

## 2021-07-30 SURGERY — ARTHROSCOPY, KNEE
Anesthesia: General | Site: Knee | Laterality: Right

## 2021-07-30 MED ORDER — BUPIVACAINE HCL (PF) 0.25 % IJ SOLN
INTRAMUSCULAR | Status: DC | PRN
  Administered 2021-07-30: 20 mL

## 2021-07-30 MED ORDER — GLYCOPYRROLATE PF 0.2 MG/ML IJ SOSY
PREFILLED_SYRINGE | INTRAMUSCULAR | Status: AC
  Filled 2021-07-30: qty 1

## 2021-07-30 MED ORDER — CLONIDINE HCL (ANALGESIA) 100 MCG/ML EP SOLN
EPIDURAL | Status: DC | PRN
Start: 2021-07-30 — End: 2021-07-30
  Administered 2021-07-30: 100 ug

## 2021-07-30 MED ORDER — CELECOXIB 200 MG PO CAPS
200.0000 mg | ORAL_CAPSULE | Freq: Once | ORAL | Status: AC
  Administered 2021-07-30: 200 mg via ORAL
  Filled 2021-07-30: qty 1

## 2021-07-30 MED ORDER — OXYCODONE-ACETAMINOPHEN 5-325 MG PO TABS
1.0000 | ORAL_TABLET | ORAL | 0 refills | Status: DC | PRN
Start: 2021-07-30 — End: 2021-08-04

## 2021-07-30 MED ORDER — GLYCOPYRROLATE PF 0.2 MG/ML IJ SOSY
PREFILLED_SYRINGE | INTRAMUSCULAR | Status: DC | PRN
  Administered 2021-07-30: .2 mg via INTRAVENOUS

## 2021-07-30 MED ORDER — EPINEPHRINE PF 1 MG/ML IJ SOLN
INTRAMUSCULAR | Status: AC
  Filled 2021-07-30: qty 2

## 2021-07-30 MED ORDER — HYDROMORPHONE HCL 1 MG/ML IJ SOLN: 0.2500 mg | INTRAMUSCULAR | Status: DC | PRN

## 2021-07-30 MED ORDER — FENTANYL CITRATE (PF) 100 MCG/2ML IJ SOLN
INTRAMUSCULAR | Status: DC | PRN
  Administered 2021-07-30: 25 ug via INTRAVENOUS
  Administered 2021-07-30: 50 ug via INTRAVENOUS
  Administered 2021-07-30 (×4): 25 ug via INTRAVENOUS

## 2021-07-30 MED ORDER — CHLORHEXIDINE GLUCONATE 0.12 % MT SOLN: 15.0000 mL | Freq: Once | OROMUCOSAL | Status: AC

## 2021-07-30 MED ORDER — POVIDONE-IODINE 7.5 % EX SOLN
Freq: Once | CUTANEOUS | Status: DC
  Filled 2021-07-30: qty 118

## 2021-07-30 MED ORDER — CEFAZOLIN SODIUM-DEXTROSE 2-4 GM/100ML-% IV SOLN
2.0000 g | INTRAVENOUS | Status: AC
  Administered 2021-07-30: 2 g via INTRAVENOUS
  Filled 2021-07-30: qty 100

## 2021-07-30 MED ORDER — BUPIVACAINE-EPINEPHRINE 0.25% -1:200000 IJ SOLN
INTRAMUSCULAR | Status: DC | PRN
Start: 2021-07-30 — End: 2021-07-30
  Administered 2021-07-30: 10 mL

## 2021-07-30 MED ORDER — ASPIRIN 81 MG PO CHEW: 81.0000 mg | CHEWABLE_TABLET | Freq: Every day | ORAL | 0 refills | Status: AC

## 2021-07-30 MED ORDER — TIZANIDINE HCL 4 MG PO TABS: 4.0000 mg | ORAL_TABLET | Freq: Three times a day (TID) | ORAL | 0 refills | Status: DC

## 2021-07-30 MED ORDER — MIDAZOLAM HCL 2 MG/2ML IJ SOLN
INTRAMUSCULAR | Status: AC
  Filled 2021-07-30: qty 2

## 2021-07-30 MED ORDER — MORPHINE SULFATE (PF) 4 MG/ML IV SOLN
INTRAVENOUS | Status: AC
  Filled 2021-07-30: qty 2

## 2021-07-30 MED ORDER — ORAL CARE MOUTH RINSE: 15.0000 mL | Freq: Once | OROMUCOSAL | Status: AC

## 2021-07-30 MED ORDER — SODIUM CHLORIDE 0.9 % IR SOLN
Status: DC | PRN
  Administered 2021-07-30: 3000 mL

## 2021-07-30 MED ORDER — BUPIVACAINE HCL (PF) 0.25 % IJ SOLN
INTRAMUSCULAR | Status: AC
  Filled 2021-07-30: qty 30

## 2021-07-30 MED ORDER — CHLORHEXIDINE GLUCONATE 0.12 % MT SOLN
OROMUCOSAL | Status: AC
  Administered 2021-07-30: 15 mL via OROMUCOSAL
  Filled 2021-07-30: qty 15

## 2021-07-30 MED ORDER — LACTATED RINGERS IV SOLN: INTRAVENOUS | Status: DC

## 2021-07-30 MED ORDER — ACETAMINOPHEN 500 MG PO TABS
1000.0000 mg | ORAL_TABLET | Freq: Once | ORAL | Status: AC
  Administered 2021-07-30: 1000 mg via ORAL
  Filled 2021-07-30: qty 2

## 2021-07-30 MED ORDER — ONDANSETRON HCL 4 MG/2ML IJ SOLN
INTRAMUSCULAR | Status: AC
  Filled 2021-07-30: qty 2

## 2021-07-30 MED ORDER — BUPIVACAINE-EPINEPHRINE (PF) 0.25% -1:200000 IJ SOLN
INTRAMUSCULAR | Status: AC
  Filled 2021-07-30: qty 30

## 2021-07-30 MED ORDER — FENTANYL CITRATE (PF) 250 MCG/5ML IJ SOLN
INTRAMUSCULAR | Status: AC
  Filled 2021-07-30: qty 5

## 2021-07-30 MED ORDER — LIDOCAINE 2% (20 MG/ML) 5 ML SYRINGE
INTRAMUSCULAR | Status: AC
  Filled 2021-07-30: qty 5

## 2021-07-30 MED ORDER — HYDROCORTISONE 10 MG PO TABS: ORAL_TABLET | ORAL | 0 refills | Status: AC

## 2021-07-30 MED ORDER — METHOCARBAMOL 500 MG PO TABS: 500.0000 mg | ORAL_TABLET | Freq: Three times a day (TID) | ORAL | 0 refills | Status: DC | PRN

## 2021-07-30 MED ORDER — POVIDONE-IODINE 10 % EX SWAB
2.0000 "application " | Freq: Once | CUTANEOUS | Status: AC
  Administered 2021-07-30: 2 via TOPICAL

## 2021-07-30 MED ORDER — ONDANSETRON HCL 4 MG/2ML IJ SOLN
INTRAMUSCULAR | Status: DC | PRN
  Administered 2021-07-30: 4 mg via INTRAVENOUS

## 2021-07-30 MED ORDER — LIDOCAINE HCL (CARDIAC) PF 100 MG/5ML IV SOSY
PREFILLED_SYRINGE | INTRAVENOUS | Status: DC | PRN
Start: 2021-07-30 — End: 2021-07-30
  Administered 2021-07-30: 100 mg via INTRAVENOUS

## 2021-07-30 MED ORDER — DEXAMETHASONE SODIUM PHOSPHATE 10 MG/ML IJ SOLN
INTRAMUSCULAR | Status: AC
  Filled 2021-07-30: qty 1

## 2021-07-30 MED ORDER — PROPOFOL 10 MG/ML IV BOLUS
INTRAVENOUS | Status: DC | PRN
Start: 2021-07-30 — End: 2021-07-30
  Administered 2021-07-30: 200 mg via INTRAVENOUS

## 2021-07-30 MED ORDER — MIDAZOLAM HCL 5 MG/5ML IJ SOLN
INTRAMUSCULAR | Status: DC | PRN
Start: 2021-07-30 — End: 2021-07-30
  Administered 2021-07-30: 2 mg via INTRAVENOUS

## 2021-07-30 MED ORDER — MORPHINE SULFATE 4 MG/ML IJ SOLN
INTRAMUSCULAR | Status: DC | PRN
  Administered 2021-07-30 (×2): 4 mg

## 2021-07-30 MED ORDER — EPINEPHRINE PF 1 MG/ML IJ SOLN
INTRAMUSCULAR | Status: DC | PRN
  Administered 2021-07-30 (×2): 1 mg

## 2021-07-30 MED ORDER — MEPERIDINE HCL 25 MG/ML IJ SOLN: 6.2500 mg | INTRAMUSCULAR | Status: DC | PRN

## 2021-07-30 MED ORDER — DROPERIDOL 2.5 MG/ML IJ SOLN: 0.6250 mg | Freq: Once | INTRAMUSCULAR | Status: DC | PRN

## 2021-07-30 MED ORDER — HYDROCORTISONE SOD SUC (PF) 100 MG IJ SOLR
100.0000 mg | Freq: Once | INTRAMUSCULAR | Status: AC
  Administered 2021-07-30: 100 mg via INTRAVENOUS
  Filled 2021-07-30: qty 2

## 2021-07-30 SURGICAL SUPPLY — 47 items
BAG COUNTER SPONGE SURGICOUNT (BAG) ×2 IMPLANT
BANDAGE ESMARK 6X9 LF (GAUZE/BANDAGES/DRESSINGS) IMPLANT
BLADE CLIPPER SURG (BLADE) IMPLANT
BLADE EXCALIBUR 4.0X13 (MISCELLANEOUS) ×2 IMPLANT
BNDG ELASTIC 6X10 VLCR STRL LF (GAUZE/BANDAGES/DRESSINGS) IMPLANT
BNDG ELASTIC 6X5.8 VLCR STR LF (GAUZE/BANDAGES/DRESSINGS) ×1 IMPLANT
BNDG ESMARK 6X9 LF (GAUZE/BANDAGES/DRESSINGS)
COVER SURGICAL LIGHT HANDLE (MISCELLANEOUS) ×2 IMPLANT
CUFF TOURN SGL QUICK 34 (TOURNIQUET CUFF)
CUFF TOURN SGL QUICK 42 (TOURNIQUET CUFF) IMPLANT
CUFF TRNQT CYL 34X4.125X (TOURNIQUET CUFF) IMPLANT
DRAPE ARTHROSCOPY W/POUCH 114 (DRAPES) ×2 IMPLANT
DRAPE U-SHAPE 47X51 STRL (DRAPES) ×2 IMPLANT
DRSG TEGADERM 4X4.75 (GAUZE/BANDAGES/DRESSINGS) ×2 IMPLANT
DURAPREP 26ML APPLICATOR (WOUND CARE) ×2 IMPLANT
DW OUTFLOW CASSETTE/TUBE SET (MISCELLANEOUS) ×2 IMPLANT
GAUZE SPONGE 4X4 12PLY STRL (GAUZE/BANDAGES/DRESSINGS) IMPLANT
GAUZE XEROFORM 1X8 LF (GAUZE/BANDAGES/DRESSINGS) IMPLANT
GLOVE SRG 8 PF TXTR STRL LF DI (GLOVE) ×1 IMPLANT
GLOVE SURG LTX SZ8 (GLOVE) ×2 IMPLANT
GLOVE SURG UNDER POLY LF SZ8 (GLOVE) ×2
GOWN STRL REUS W/ TWL LRG LVL3 (GOWN DISPOSABLE) ×2 IMPLANT
GOWN STRL REUS W/ TWL XL LVL3 (GOWN DISPOSABLE) ×1 IMPLANT
GOWN STRL REUS W/TWL LRG LVL3 (GOWN DISPOSABLE) ×4
GOWN STRL REUS W/TWL XL LVL3 (GOWN DISPOSABLE) ×2
KIT BASIN OR (CUSTOM PROCEDURE TRAY) ×2 IMPLANT
KIT TURNOVER KIT B (KITS) ×2 IMPLANT
MANIFOLD NEPTUNE II (INSTRUMENTS) IMPLANT
NDL 18GX1X1/2 (RX/OR ONLY) (NEEDLE) IMPLANT
NDL HYPO 25GX1X1/2 BEV (NEEDLE) ×1 IMPLANT
NEEDLE 18GX1X1/2 (RX/OR ONLY) (NEEDLE) IMPLANT
NEEDLE HYPO 25GX1X1/2 BEV (NEEDLE) ×2 IMPLANT
NS IRRIG 1000ML POUR BTL (IV SOLUTION) IMPLANT
PACK ARTHROSCOPY DSU (CUSTOM PROCEDURE TRAY) ×2 IMPLANT
PAD ARMBOARD 7.5X6 YLW CONV (MISCELLANEOUS) ×4 IMPLANT
PADDING CAST COTTON 6X4 STRL (CAST SUPPLIES) ×2 IMPLANT
PORT APPOLLO RF 90DEGREE MULTI (SURGICAL WAND) IMPLANT
SPONGE T-LAP 4X18 ~~LOC~~+RFID (SPONGE) ×2 IMPLANT
SUT ETHILON 3 0 PS 1 (SUTURE) ×2 IMPLANT
SYR 20ML ECCENTRIC (SYRINGE) ×2 IMPLANT
SYR CONTROL 10ML LL (SYRINGE) IMPLANT
SYR TB 1ML LUER SLIP (SYRINGE) ×2 IMPLANT
TOWEL GREEN STERILE (TOWEL DISPOSABLE) ×2 IMPLANT
TOWEL GREEN STERILE FF (TOWEL DISPOSABLE) ×2 IMPLANT
TUBE CONNECTING 12X1/4 (SUCTIONS) ×2 IMPLANT
TUBING ARTHROSCOPY IRRIG 16FT (MISCELLANEOUS) ×2 IMPLANT
WATER STERILE IRR 1000ML POUR (IV SOLUTION) ×2 IMPLANT

## 2021-07-30 NOTE — Brief Op Note (Signed)
? ?  07/30/2021 ? ?3:23 PM ? ?PATIENT:  Jose Hayes  61 y.o. male ? ?PRE-OPERATIVE DIAGNOSIS:  right knee lateral meniscal tear, loose body ? ?POST-OPERATIVE DIAGNOSIS:  right knee lateral meniscal tear, loose body ? ?PROCEDURE:  Procedure(s): ?right knee arthroscopy, debridement, removal loose body ? ?SURGEON:  Surgeon(s): ?Meredith Pel, MD ? ?ASSISTANT: Annie Main, PA ? ?ANESTHESIA:   general ? ?EBL: 10 ml   ? ?Total I/O ?In: 100 [IV Piggyback:100] ?Out: 15 [Blood:15] ? ?BLOOD ADMINISTERED: none ? ?DRAINS: none  ? ?LOCAL MEDICATIONS USED: Marcaine morphine clonidine ? ?SPECIMEN:  No Specimen ? ?COUNTS:  YES ? ?TOURNIQUET:  * Missing tourniquet times found for documented tourniquets in log: [id number] * ? ?DICTATION: .Other Dictation: Dictation Number [id number] ? ?PLAN OF CARE: Discharge to home after PACU ? ?PATIENT DISPOSITION:  PACU - hemodynamically stable ? ? ? ? ? ? ? ? ? ? ? ? ?  ?

## 2021-07-30 NOTE — H&P (Signed)
Jose Hayes is an 61 y.o. male.   ?Chief Complaint: Right knee pain ?HPI: Jose Hayes is a 61 year old patient who sustained an injury to his knee several months ago.  Since he was last seen he has had an MRI scan which shows loose body in the knee near the anterior lateral aspect of the lateral meniscus.  He also has a lateral meniscal tear.  ACL intact and the MPFL also appears to be intact.  He does describe episodes of "instability" in the knee.  Hard for him to sit.  Does have a history of cancer.  No history of DVT.  Wife is at home.  Has history of adrenal insufficiency.  We do have instructions from his endocrinologist as to how to manage that adrenal insufficiency in the perioperative period [place] ? ?Past Medical History:  ?Diagnosis Date  ? Allergy   ? Anxiety   ? COVID-19   ? 1/14, 05/24/20  ? Esophageal cancer ([place]) 10/21/2016  ? GE junction  ? GERD (gastroesophageal reflux disease)   ? Hyperglycemia   ? Hyperlipidemia   ? Hypertension   ? Sleep apnea   ? CPAP  ? SVT (supraventricular tachycardia) ([place])   ? 2013  ? Thyroid disease   ? Tobacco abuse   ? ? ?Past Surgical History:  ?Procedure Laterality Date  ? COLONOSCOPY    ? LAPAROSCOPIC CHOLECYSTECTOMY  1991  ? [place], 2000  ? left x3  ? swidom teeth extraction    ? UPPER GASTROINTESTINAL ENDOSCOPY    ? ? ?Family History  ?Problem Relation Age of Onset  ? Hypertension Mother   ? Hypertension Father   ? Pancreatic cancer Father   ? Clotting disorder Father   ?     renal cell ca  ? Colon cancer Neg Hx   ? Esophageal cancer Neg Hx   ? Prostate cancer Neg Hx   ? Rectal cancer Neg Hx   ? Stomach cancer Neg Hx   ? Colon polyps Neg Hx   ? ?Social History:  reports that he has quit smoking. His smoking use included cigarettes. He has a 12.50 pack-year smoking history. He has never used smokeless tobacco. He reports that he does not currently use alcohol. He reports that he does not use drugs. ? ?Allergies: No Known  Allergies ? ?Medications Prior to Admission  ?Medication Sig Dispense Refill  ? ALPRAZolam (XANAX) 1 MG tablet Take 1 mg by mouth 3 (three) times daily as needed for anxiety.    ? amLODipine (NORVASC) 5 MG tablet TAKE 1 TABLET BY MOUTH EVERY DAY 30 tablet 0  ? HYDROcodone-acetaminophen (NORCO/VICODIN) 5-325 MG tablet Take 1 tablet by mouth every 6 (six) hours as needed for moderate pain. 20 tablet 0  ? hydrocortisone (CORTEF) 10 MG tablet Take 10-15 mg by mouth See admin instructions. Take 1.5 tablets (15 mg) by mouth in the morning (1 hour after levothyroxine) & take 1 tablet (10 mg) by mouth in the afternoon at 2 pm.    ? levothyroxine (SYNTHROID) 88 MCG tablet Take 88 mcg by mouth daily before breakfast.    ? rosuvastatin (CRESTOR) 20 MG tablet Take 1 tablet (20 mg total) by mouth daily. 100 tablet 3  ? testosterone cypionate (DEPOTESTOSTERONE CYPIONATE) 200 MG/ML injection Inject into the muscle every Monday. 0.38ms    ? verapamil (CALAN) 40 MG tablet Take 1 tablet (40 mg total) by mouth 3 (three) times daily as needed. 9[place]  tablet 1  ? ? ?Results for orders placed or performed during the hospital encounter of 07/30/21 (from the past 48 hour(s))  ?CBC     Status: None  ? Collection Time: 07/30/21 12:21 PM  ?Result Value Ref Range  ? WBC 5.8 4.0 - 10.5 K/uL  ? RBC 4.99 4.22 - 5.81 MIL/uL  ? Hemoglobin 14.8 13.0 - 17.0 g/dL  ? HCT 43.1 39.0 - 52.0 %  ? MCV 86.4 80.0 - 100.0 fL  ? MCH 29.7 26.0 - 34.0 pg  ? MCHC 34.3 30.0 - 36.0 g/dL  ? RDW 13.8 11.5 - 15.5 %  ? Platelets 194 150 - 400 K/uL  ? nRBC 0.0 0.0 - 0.2 %  ?  Comment: Performed at [facility], [place] [address], [place], [place] [address]  ?Basic metabolic panel     Status: Abnormal  ? Collection Time: 07/30/21 12:21 PM  ?Result Value Ref Range  ? Sodium 141 135 - 145 mmol/L  ? Potassium 4.1 3.5 - 5.1 mmol/L  ? Chloride 106 98 - 111 mmol/L  ? CO2 24 22 - 32 mmol/L  ? Glucose, Bld 103 (H) 70 - 99 mg/dL  ?  Comment: Glucose reference range applies only to  samples taken after fasting for at least 8 hours.  ? BUN 9 6 - 20 mg/dL  ? Creatinine, Ser 1.05 0.61 - 1.24 mg/dL  ? Calcium 8.8 (L) 8.9 - 10.3 mg/dL  ? GFR, Estimated >60 >60 mL/min  ?  Comment: (NOTE) ?Calculated using the CKD-EPI Creatinine Equation (2021) ?  ? Anion gap 11 5 - 15  ?  Comment: Performed at [facility], [place] [address], [place], [place] [address]  ? ?No results found. ? ?Review of Systems  ?Musculoskeletal:  Positive for arthralgias.  ?All other systems reviewed and are negative. ? ?Blood pressure 126/67, pulse 65, temperature 98.3 ?F (36.8 ?C), resp. rate 17, height '5\' 9"'$  (1.753 m), weight 104.3 kg, SpO2 98 %. ?Physical Exam ?Vitals reviewed.  ?HENT:  ?   Head: Normocephalic.  ?   Nose: Nose normal.  ?   Mouth/Throat:  ?   Mouth: Mucous membranes are moist.  ?Eyes:  ?   Pupils: Pupils are equal, round, and reactive to light.  ?Cardiovascular:  ?   Rate and Rhythm: Normal rate.  ?   Pulses: Normal pulses.  ?Pulmonary:  ?   Effort: Pulmonary effort is normal.  ?Abdominal:  ?   General: Abdomen is flat.  ?Musculoskeletal:  ?   Cervical back: Normal range of motion.  ?Skin: ?   General: Skin is warm.  ?   Capillary Refill: Capillary refill takes less than 2 seconds.  ?Neurological:  ?   General: No focal deficit present.  ?   Mental Status: He is alert.  ?Psychiatric:     ?   Mood and Affect: Mood normal.  ? Ortho exam demonstrates full active and passive range of motion of the right knee with mild effusion present.  Collateral and cruciate ligaments are stable.  No patellar apprehension.  No groin pain with internal or external rotation of the leg.  Has lateral greater than medial joint line tenderness with palpable pedal pulses no calf tenderness negative Homans ? ?Assessment/Plan ?Impression is lateral meniscal tear and loose body in the right knee.  Plan is right knee arthroscopy with loose body removal and debridement of that lateral meniscal tear.  The risk and benefits of the procedure  are discussed  with the patient including not limited to infection nerve vessel damage incomplete pain relief as well as knee stiffness.  Perioperative steroids will be required because of his adrenal insufficiency.  Patient understands the risk and benefits as well as the expected rehabilitative course.  All questions answered ? ?Anderson Malta, MD ?07/30/2021, 1:34 PM ? ? ? ?

## 2021-07-30 NOTE — Anesthesia Procedure Notes (Signed)
Procedure Name: LMA Insertion ?Date/Time: 07/30/2021 2:02 PM ?Performed by: Michele Rockers, CRNA ?Pre-anesthesia Checklist: Patient identified, Patient being monitored, Timeout performed, Emergency Drugs available and Suction available ?Patient Re-evaluated:Patient Re-evaluated prior to induction ?Oxygen Delivery Method: Circle system utilized ?Preoxygenation: Pre-oxygenation with 100% oxygen ?Induction Type: IV induction ?Ventilation: Mask ventilation without difficulty ?LMA: LMA inserted ?LMA Size: 5.0 ?Number of attempts: 1 ?Placement Confirmation: positive ETCO2 and breath sounds checked- equal and bilateral ?Tube secured with: Tape ?Dental Injury: Teeth and Oropharynx as per pre-operative assessment  ? ? ? ? ?

## 2021-07-30 NOTE — Anesthesia Postprocedure Evaluation (Signed)
Anesthesia Post Note ? ?Patient: Jose Hayes ? ?Procedure(s) Performed: right knee arthroscopy, debridement, removal loose body (Right: Knee) ? ?  ? ?Patient location during evaluation: PACU ?Anesthesia Type: General ?Level of consciousness: awake and alert ?Pain management: pain level controlled ?Vital Signs Assessment: post-procedure vital signs reviewed and stable ?Respiratory status: spontaneous breathing, nonlabored ventilation, respiratory function stable and patient connected to nasal cannula oxygen ?Cardiovascular status: blood pressure returned to baseline and stable ?Postop Assessment: no apparent nausea or vomiting ?Anesthetic complications: no ? ? ?No notable events documented. ? ?Last Vitals:  ?Vitals:  ? 07/30/21 1545 07/30/21 1555  ?BP: 122/76 122/76  ?Pulse:  70  ?Resp:  15  ?Temp: 36.5 ?C 36.5 ?C  ?SpO2:  94%  ?  ?Last Pain:  ?Vitals:  ? 07/30/21 1555  ?PainSc: 0-No pain  ? ? ?  ?  ?  ?  ?  ?  ? ?Porschea Borys S ? ? ? ? ?

## 2021-07-30 NOTE — Op Note (Signed)
NAME: Jose Hayes, Jose Hayes ?MEDICAL RECORD NO: [id number] ?ACCOUNT NO: [id number] ?DATE OF BIRTH: Dec 14, 1960 ?FACILITY: [facility] ?LOCATION: [facility] ?PHYSICIAN: Yetta Barre. Marlou Sa, MD ? ?Operative Report  ? ?DATE OF PROCEDURE: 07/30/2021 ? ?PREOPERATIVE DIAGNOSIS:  Right knee lateral meniscal tear and loose body, anterior compartment. ? ?POSTOPERATIVE DIAGNOSIS:  Right knee lateral meniscal tear and loose body, anterior compartment. ? ?PROCEDURES:  Right knee arthroscopy with debridement of torn discoid lateral meniscus and removal of 8 x 8 mm loose body. ? ?SURGEON:  Yetta Barre. Marlou Sa, MD ? ?ASSISTANT:  Annie Main, PA. ? ?INDICATIONS:  The patient is a 61 year old patient with right knee pain and locking symptoms.  Presents for operative management after explanation of risks and benefits.  MRI scan shows torn lateral meniscus and loose body in the anterior compartment of  ?the knee. ? ?DESCRIPTION OF PROCEDURE:  The patient was brought to the operating room where general anesthetic was induced.  Preoperative antibiotics administered.  Timeout was called.  Right leg was prescrubbed with alcohol, Betadine, allowed to air dry.  Prepped  ?with DuraPrep solution and draped in a sterile manner.  Charlie Pitter was not utilized in this case. The patient had full extension, full flexion, good stability to varus and valgus stress at 0, 30 and 90 degrees and stable ACL and PCL, also stable patella to  ?lateral translation at 0 and 20 degrees.   ? ?After sterile prepping and draping, examination under anesthesia, a timeout was called.  Anterior inferolateral portals were established. Anterior inferomedial portal was established under direct visualization.  Diagnostic arthroscopy was performed.  The ? 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  No loose bodies in medial or lateral gutter or suprapatellar pouch.  Medial meniscus intact.  Grade 2  chondromalacial  ?changes over about 30% of the medial femoral condyle and medial tibial plateau.  ACL and PCL intact.  The patient had a loose body, anterior to the anterior horn of the lateral meniscus.  This was removed through the enlarged medial portal. One other  ?smaller loose body measuring 3 x 3 mm also removed.  The patient also had torn discoid lateral meniscus in the lateral compartment.  Grade 2 chondromalacial changes also present in this compartment on the tibial plateau about 20% and lateral femoral  ?condyle about 10%.  The lateral meniscal tear was debrided back to a stable rim using a combination of basket punch and shaver.  All in all, he had about 50% rim remaining at its tendinous portion.  Thorough search was performed in all compartments for  ?additional loose bodies including the posterior compartment, but none were found. Thorough irrigation performed in the knee joint.  Instruments were removed.  Portals were closed using #2 Vicryl, 3-0 nylon.  A solution of Marcaine, morphine, clonidine  ?injected into the knee for postoperative pain relief.  The patient tolerated the procedure well without immediate complications.  ? ?Luke's assistance was required for limb positioning, opening, closing and retrieval of the loose bodies.  His assistance was a medical necessity. ? ? ?MUK ?D: 07/30/2021 3:27:56 pm T: 07/30/2021 10:30:00 pm  ?JOB: [id number]/ [id number]  ?

## 2021-07-30 NOTE — Transfer of Care (Signed)
Immediate Anesthesia Transfer of Care Note ? ?Patient: Jose Hayes ? ?Procedure(s) Performed: right knee arthroscopy, debridement, removal loose body (Right: Knee) ? ?Patient Location: PACU ? ?Anesthesia Type:General ? ?Level of Consciousness: drowsy and patient cooperative ? ?Airway & Oxygen Therapy: Patient Spontanous Breathing and Patient connected to face mask oxygen ? ?Post-op Assessment: Report given to RN and Post -op Vital signs reviewed and stable ? ?Post vital signs: Reviewed and stable ? ?Last Vitals:  ?Vitals Value Taken Time  ?BP 151/89 07/30/21 1525  ?Temp    ?Pulse 75 07/30/21 1529  ?Resp 17 07/30/21 1529  ?SpO2 98 % 07/30/21 1529  ?Vitals shown include unvalidated device data. ? ?Last Pain:  ?Vitals:  ? 07/30/21 1209  ?PainSc: 7   ?   ? ?Patients Stated Pain Goal: 3 (07/30/21 1209) ? ?Complications: No notable events documented. ?

## 2021-07-31 ENCOUNTER — Encounter (HOSPITAL_COMMUNITY): Payer: Self-pay | Admitting: Orthopedic Surgery

## 2021-07-31 ENCOUNTER — Telehealth: Payer: Self-pay

## 2021-07-31 ENCOUNTER — Other Ambulatory Visit: Payer: Self-pay | Admitting: Physician Assistant

## 2021-07-31 NOTE — Telephone Encounter (Signed)
IC advised ok to use ice and that likely filled partial rx for pain due to insurance allowable for pain medication.  ?

## 2021-07-31 NOTE — Telephone Encounter (Signed)
Patient called wanting to know is he should be using ice on his knee.  Stated that the right knee is swollen, which is to be expected, but wanted to make sure that it's normal and concerned about his Rx not being completely filled and what can be done about?  Cb# [id number].  Please advise.  Thank you. ?

## 2021-08-01 DIAGNOSIS — M2341 Loose body in knee, right knee: Secondary | ICD-10-CM

## 2021-08-01 DIAGNOSIS — S83271A Complex tear of lateral meniscus, current injury, right knee, initial encounter: Secondary | ICD-10-CM

## 2021-08-03 ENCOUNTER — Telehealth: Payer: Self-pay | Admitting: Orthopedic Surgery

## 2021-08-03 NOTE — Telephone Encounter (Signed)
Pt called stating they gave his half of his script of oxycodone and now the pharmacy I believe need a call or pre auth to release other set of meds. Please call pt at [phone]. ?

## 2021-08-04 ENCOUNTER — Other Ambulatory Visit: Payer: Self-pay | Admitting: Surgical

## 2021-08-04 DIAGNOSIS — G4733 Obstructive sleep apnea (adult) (pediatric): Secondary | ICD-10-CM | POA: Diagnosis not present

## 2021-08-04 DIAGNOSIS — G4730 Sleep apnea, unspecified: Secondary | ICD-10-CM | POA: Diagnosis not present

## 2021-08-04 MED ORDER — OXYCODONE-ACETAMINOPHEN 5-325 MG PO TABS: 1.0000 | ORAL_TABLET | ORAL | 0 refills | Status: DC | PRN

## 2021-08-04 NOTE — Telephone Encounter (Signed)
IC s/w pharmacist to verify. Per pharmacist the issue was that the pharmacy only had 20 tablets in stock due to shortage and they advised patient if he took the 20 tablets remaining portion of script would be voided. I called and talked with patient. He stated that he was not notified of any of this. He did state he has a few pills left but since he only received #20 he will need a new script. Can you please advise on this? If new script sent in, he would like to change pharmacies to something closer to his home which [facility] in [place].  ?

## 2021-08-04 NOTE — Telephone Encounter (Signed)
Probably easier to just change pharmacies, I can send a new script to [facility]

## 2021-08-04 NOTE — Telephone Encounter (Signed)
IC advised.  

## 2021-08-06 ENCOUNTER — Ambulatory Visit (INDEPENDENT_AMBULATORY_CARE_PROVIDER_SITE_OTHER): Payer: Medicare HMO | Admitting: Surgical

## 2021-08-06 ENCOUNTER — Encounter: Payer: Self-pay | Admitting: Orthopedic Surgery

## 2021-08-06 DIAGNOSIS — Z9889 Other specified postprocedural states: Secondary | ICD-10-CM

## 2021-08-06 NOTE — Progress Notes (Signed)
? ?Post-Op Visit Note ?  ?Patient: Jose Hayes           ?Date of Birth: 07/08/1960           ?MRN: [id number] ?Visit Date: 08/06/2021 ?PCP: Leone Haven, MD ? ? ?Assessment & Plan: ? ?Chief Complaint:  ?Chief Complaint  ?Patient presents with  ? Right Knee - Routine Post Op  ?   07/30/21 (7d) right knee arthroscopy, debridement, removal loose body - Right ? ?  ? ?Visit Diagnoses: No diagnosis found. ? ?Plan: Patient is a 61 year old male who presents s/p right knee arthroscopy with debridement and loose body removal on 07/30/2021.  He is doing well overall and pain is controlled.  He has been able to walk without a crutch for the most part since last night though he is still using 1 crutch for assistance at times.  He denies any chest pain, shortness of breath, calf pain.  Taking aspirin for DVT prophylaxis.  He has returned back to his baseline level of hydrocortisone orally.  Taking Tylenol and occasional oxycodone for pain control.  Has to take oxycodone twice per day at most.  He has no locking symptoms. ? ?On exam, patient has small effusion.  3 degrees extension and 100 degrees of knee flexion.  No calf tenderness.  Negative Homans' sign.  Patient is able to perform straight leg raise without extensor lag.  Excellent quad strength rated 5/5.  Incisions look to be healing well without evidence of infection or dehiscence.  Sutures removed and replaced with Steri-Strips. ? ?Plan is to continue with knee range of motion exercises.  Continue with straight leg raises and focus on achieving full knee extension.  This was demonstrated for him in the office today.  He was recommended to use a stationary bike.  He will follow-up with the office in 4 weeks for clinical recheck with Dr. Marlou Sa. ? ?Follow-Up Instructions: No follow-ups on file.  ? ?Orders:  ?No orders of the defined types were placed in this encounter. ? ?No orders of the defined types were placed in this encounter. ? ? ?Imaging: ?No results  found. ? ?PMFS History: ?Patient Active Problem List  ? Diagnosis Date Noted  ? Complex tear of lateral meniscus of right knee as current injury   ? Loose body in knee, right knee   ? Right knee pain 07/01/2021  ? Pulsatile tinnitus 06/22/2021  ? Left foot pain 06/22/2021  ? Inguinal hernia 12/19/2020  ? Adrenal insufficiency ([place]) 05/30/2020  ? Hypothyroidism 05/30/2020  ? Anxiety 05/30/2020  ? Gastroesophageal cancer ([place]) 11/02/2016  ? Kidney stones 10/13/2016  ? Restless leg syndrome 09/04/2015  ? Hyperlipidemia 12/01/2011  ? SVT (supraventricular tachycardia) ([place]) 12/01/2011  ? Obesity 12/01/2011  ? Hypertension 12/01/2011  ? ?Past Medical History:  ?Diagnosis Date  ? Allergy   ? Anxiety   ? COVID-19   ? 1/14, 05/24/20  ? Esophageal cancer ([place]) 10/21/2016  ? GE junction  ? GERD (gastroesophageal reflux disease)   ? Hyperglycemia   ? Hyperlipidemia   ? Hypertension   ? Sleep apnea   ? CPAP  ? SVT (supraventricular tachycardia) ([place])   ? 2013  ? Thyroid disease   ? Tobacco abuse   ?  ?Family History  ?Problem Relation Age of Onset  ? Hypertension Mother   ? Hypertension Father   ? Pancreatic cancer Father   ? Clotting disorder Father   ?     renal cell ca  ? Colon  cancer Neg Hx   ? Esophageal cancer Neg Hx   ? Prostate cancer Neg Hx   ? Rectal cancer Neg Hx   ? Stomach cancer Neg Hx   ? Colon polyps Neg Hx   ?  ?Past Surgical History:  ?Procedure Laterality Date  ? COLONOSCOPY    ? KNEE ARTHROSCOPY Right 07/30/2021  ? Procedure: right knee arthroscopy, debridement, removal loose body;  Surgeon: Meredith Pel, MD;  Location: [place];  Service: Orthopedics;  Laterality: Right;  ? [place]  ? [place], 2000  ? left x3  ? swidom teeth extraction    ? UPPER GASTROINTESTINAL ENDOSCOPY    ? ?Social History  ? ?Occupational History  ? Not on file  ?Tobacco Use  ? Smoking status: Former  ?  Packs/day: 0.50  ?  Years: 25.00  ?  Pack years: 12.50  ?  Types: Cigarettes   ? Smokeless tobacco: Never  ? Tobacco comments:  ?  trying gum to quit  ?Vaping Use  ? Vaping Use: Former  ?Substance and Sexual Activity  ? Alcohol use: Not Currently  ?  Comment: occasionally  ? Drug use: No  ? Sexual activity: Not on file  ? ? ? ?

## 2021-08-10 DIAGNOSIS — F411 Generalized anxiety disorder: Secondary | ICD-10-CM | POA: Diagnosis not present

## 2021-08-10 DIAGNOSIS — R69 Illness, unspecified: Secondary | ICD-10-CM | POA: Diagnosis not present

## 2021-08-17 ENCOUNTER — Telehealth: Payer: Self-pay

## 2021-08-17 ENCOUNTER — Ambulatory Visit (HOSPITAL_COMMUNITY)
Admission: RE | Admit: 2021-08-17 | Discharge: 2021-08-17 | Disposition: A | Discharge status: Home / Self Care | Payer: Medicare HMO | Source: Ambulatory Visit | Attending: Orthopedic Surgery | Admitting: Orthopedic Surgery

## 2021-08-17 ENCOUNTER — Other Ambulatory Visit: Payer: Self-pay

## 2021-08-17 DIAGNOSIS — Z9889 Other specified postprocedural states: Secondary | ICD-10-CM | POA: Insufficient documentation

## 2021-08-17 NOTE — Telephone Encounter (Signed)
Patient was contacted and informed that he was negative for DVT and if Dr. Marlou Sa needed to add any additional he would contact the patient. Patient understood and had no questions or concerns to discuss.  ?

## 2021-08-17 NOTE — Telephone Encounter (Signed)
thx

## 2021-08-17 NOTE — Telephone Encounter (Signed)
Radiology department called to informed you that patient was negative for DVT ?

## 2021-08-17 NOTE — Progress Notes (Signed)
Lower extremity venous RT study completed. ? ?Preliminary results relayed to [facility] for Marlou Sa, MD. ? ?See CV Proc for preliminary results report.  ? ?Darlin Coco, RDMS, RVT ? ?

## 2021-09-09 ENCOUNTER — Encounter: Payer: Self-pay | Admitting: Orthopedic Surgery

## 2021-09-09 ENCOUNTER — Ambulatory Visit (INDEPENDENT_AMBULATORY_CARE_PROVIDER_SITE_OTHER): Payer: Medicare HMO | Admitting: Orthopedic Surgery

## 2021-09-09 DIAGNOSIS — Z9889 Other specified postprocedural states: Secondary | ICD-10-CM

## 2021-09-13 ENCOUNTER — Encounter: Payer: Self-pay | Admitting: Orthopedic Surgery

## 2021-09-13 NOTE — Progress Notes (Signed)
? ?Post-Op Visit Note ?  ?Patient: Jose Hayes           ?Date of Birth: Apr 17, 1961           ?MRN: [id number] ?Visit Date: 09/09/2021 ?PCP: Leone Haven, MD ? ? ?Assessment & Plan: ? ?Chief Complaint:  ?Chief Complaint  ?Patient presents with  ? Right Knee - Routine Post Op  ? ?Visit Diagnoses:  ?1. S/P right knee arthroscopy   ? ? ?Plan: Giani is now about 5 weeks out right knee arthroscopy and meniscal debridement.  He states that everything is great.  Has some occasional pain on the lateral aspect of the knee but not taking any medication.  Doing physical therapy at home.  On exam he has full range of motion no effusion no calf tenderness negative Homans.  Continue with primarily quad strengthening exercises for the next 6 weeks.  Then okay for regular activity as tolerated.  Follow-up with [place] as needed. ? ?Follow-Up Instructions: Return if symptoms worsen or fail to improve.  ? ?Orders:  ?No orders of the defined types were placed in this encounter. ? ?No orders of the defined types were placed in this encounter. ? ? ?Imaging: ?No results found. ? ?PMFS History: ?Patient Active Problem List  ? Diagnosis Date Noted  ? Complex tear of lateral meniscus of right knee as current injury   ? Loose body in knee, right knee   ? Right knee pain 07/01/2021  ? Pulsatile tinnitus 06/22/2021  ? Left foot pain 06/22/2021  ? Inguinal hernia 12/19/2020  ? Adrenal insufficiency ([place]) 05/30/2020  ? Hypothyroidism 05/30/2020  ? Anxiety 05/30/2020  ? Gastroesophageal cancer ([place]) 11/02/2016  ? Kidney stones 10/13/2016  ? Restless leg syndrome 09/04/2015  ? Hyperlipidemia 12/01/2011  ? SVT (supraventricular tachycardia) ([place]) 12/01/2011  ? Obesity 12/01/2011  ? Hypertension 12/01/2011  ? ?Past Medical History:  ?Diagnosis Date  ? Allergy   ? Anxiety   ? COVID-19   ? 1/14, 05/24/20  ? Esophageal cancer ([place]) 10/21/2016  ? GE junction  ? GERD (gastroesophageal reflux disease)   ? Hyperglycemia   ? Hyperlipidemia   ?  Hypertension   ? Sleep apnea   ? CPAP  ? SVT (supraventricular tachycardia) ([place])   ? 2013  ? Thyroid disease   ? Tobacco abuse   ?  ?Family History  ?Problem Relation Age of Onset  ? Hypertension Mother   ? Hypertension Father   ? Pancreatic cancer Father   ? Clotting disorder Father   ?     renal cell ca  ? Colon cancer Neg Hx   ? Esophageal cancer Neg Hx   ? Prostate cancer Neg Hx   ? Rectal cancer Neg Hx   ? Stomach cancer Neg Hx   ? Colon polyps Neg Hx   ?  ?Past Surgical History:  ?Procedure Laterality Date  ? COLONOSCOPY    ? KNEE ARTHROSCOPY Right 07/30/2021  ? Procedure: right knee arthroscopy, debridement, removal loose body;  Surgeon: Meredith Pel, MD;  Location: [place];  Service: Orthopedics;  Laterality: Right;  ? [place]  ? [place], 2000  ? left x3  ? swidom teeth extraction    ? UPPER GASTROINTESTINAL ENDOSCOPY    ? ?Social History  ? ?Occupational History  ? Not on file  ?Tobacco Use  ? Smoking status: Former  ?  Packs/day: 0.50  ?  Years: 25.00  ?  Pack years: 12.50  ?  Types: Cigarettes  ? Smokeless tobacco: Never  ? Tobacco comments:  ?  trying gum to quit  ?Vaping Use  ? Vaping Use: Former  ?Substance and Sexual Activity  ? Alcohol use: Not Currently  ?  Comment: occasionally  ? Drug use: No  ? Sexual activity: Not on file  ? ? ? ?

## 2021-09-22 DIAGNOSIS — R69 Illness, unspecified: Secondary | ICD-10-CM | POA: Diagnosis not present

## 2021-09-22 DIAGNOSIS — F411 Generalized anxiety disorder: Secondary | ICD-10-CM | POA: Diagnosis not present

## 2021-09-24 DIAGNOSIS — E291 Testicular hypofunction: Secondary | ICD-10-CM | POA: Diagnosis not present

## 2021-10-14 ENCOUNTER — Telehealth: Payer: Self-pay

## 2021-10-14 ENCOUNTER — Ambulatory Visit
Admission: RE | Admit: 2021-10-14 | Discharge: 2021-10-14 | Disposition: A | Discharge status: Home / Self Care | Payer: Medicare HMO | Source: Ambulatory Visit | Attending: Family Medicine | Admitting: Family Medicine

## 2021-10-14 ENCOUNTER — Ambulatory Visit: Payer: Medicare HMO

## 2021-10-14 ENCOUNTER — Ambulatory Visit (INDEPENDENT_AMBULATORY_CARE_PROVIDER_SITE_OTHER): Payer: Medicare HMO | Admitting: Family Medicine

## 2021-10-14 ENCOUNTER — Encounter: Payer: Self-pay | Admitting: Family Medicine

## 2021-10-14 VITALS — BP 130/80 | HR 68 | Temp 98.3°F | Ht 68.0 in | Wt 253.0 lb

## 2021-10-14 DIAGNOSIS — N433 Hydrocele, unspecified: Secondary | ICD-10-CM | POA: Diagnosis not present

## 2021-10-14 DIAGNOSIS — N50812 Left testicular pain: Secondary | ICD-10-CM | POA: Insufficient documentation

## 2021-10-14 DIAGNOSIS — N5082 Scrotal pain: Secondary | ICD-10-CM | POA: Diagnosis not present

## 2021-10-14 NOTE — Progress Notes (Signed)
Tommi Rumps, MD Phone: [phone]  Jose Hayes is a 61 y.o. male who presents today for same-day visit.  Left testicle pain: Patient notes this started yesterday.  He has some swelling in his left scrotal area and in his left inguinal area.  He describes the pain as achy.  He does have a history of a hernia that has not been repaired.  He notes the testicle itself is uncomfortable.  He has had a bowel movement this morning and is passing gas.  He noted some frequent urination though no dysuria or hematuria.  Social History   Tobacco Use  Smoking Status Former   Packs/day: 0.50   Years: 25.00   Pack years: 12.50   Types: Cigarettes  Smokeless Tobacco Never  Tobacco Comments   trying gum to quit    Current Outpatient Medications on File Prior to Visit  Medication Sig Dispense Refill   ALPRAZolam (XANAX) 1 MG tablet Take 1 mg by mouth 3 (three) times daily as needed for anxiety.     amLODipine (NORVASC) 5 MG tablet TAKE 1 TABLET BY MOUTH EVERY DAY 90 tablet 3   hydrocortisone (CORTEF) 10 MG tablet Take 10-15 mg by mouth See admin instructions. Take 1.5 tablets (15 mg) by mouth in the morning (1 hour after levothyroxine) & take 1 tablet (10 mg) by mouth in the afternoon at 2 pm.     levothyroxine (SYNTHROID) 88 MCG tablet Take 88 mcg by mouth daily before breakfast.     oxyCODONE-acetaminophen (PERCOCET) 5-325 MG tablet Take 1 tablet by mouth every 4 (four) hours as needed for severe pain. 30 tablet 0   rosuvastatin (CRESTOR) 20 MG tablet Take 1 tablet (20 mg total) by mouth daily. 100 tablet 3   testosterone cypionate (DEPOTESTOSTERONE CYPIONATE) 200 MG/ML injection Inject into the muscle every Monday. 0.38ms     tiZANidine (ZANAFLEX) 4 MG tablet Take 1 tablet (4 mg total) by mouth 3 (three) times daily. 30 tablet 0   verapamil (CALAN) 40 MG tablet Take 1 tablet (40 mg total) by mouth 3 (three) times daily as needed. 90 tablet 1   No current facility-administered  medications on file prior to visit.     ROS see history of present illness  Objective  Physical Exam Vitals:   10/14/21 1112  BP: 130/80  Pulse: 68  Temp: 98.3 F (36.8 C)  SpO2: 95%    BP Readings from Last 3 Encounters:  10/14/21 130/80  07/30/21 122/76  07/14/21 130/80   Wt Readings from Last 3 Encounters:  10/14/21 253 lb (114.8 kg)  07/30/21 230 lb (104.3 kg)  07/14/21 242 lb 6 oz (109.9 kg)    Physical Exam Genitourinary:    Comments: Circumcised penis, left hemi-scrotum appears larger than the right hemi-scrotum, the testicle is oriented vertically and is tender to palpation, no masses noted, epididymis is without tenderness, there is a soft tissue fullness in the area above his testicle that is somewhat tender as well, it is difficult to tell if there is a hernia in this area    Assessment/Plan: Please see individual problem list.  Problem List Items Addressed This Visit     Left testicular pain - Primary    Discussed that this could be related to a testicular issue, a hydrocele, varicocele, or a hernia.  We will get a ultrasound today to evaluate for a specific cause.  He was advised to seek medical attention for severe discomfort or inability to have bowel movements or pass  gas.       Relevant Orders   US SCROTUM W/DOPPLER    No follow-ups on file.   Tommi Rumps, MD [place]

## 2021-10-14 NOTE — Assessment & Plan Note (Signed)
Discussed that this could be related to a testicular issue, a hydrocele, varicocele, or a hernia.  We will get a ultrasound today to evaluate for a specific cause.  He was advised to seek medical attention for severe discomfort or inability to have bowel movements or pass gas.

## 2021-10-14 NOTE — Telephone Encounter (Signed)
See result note.  

## 2021-10-14 NOTE — Patient Instructions (Signed)
Nice to see you. Please go to the med center and [place] across from the [employer] [address] for your ultrasound this afternoon.  Please arrive at [phone].

## 2021-10-15 NOTE — Telephone Encounter (Signed)
Message was sent to the patient via mychart.  [name],cma

## 2021-10-15 NOTE — Telephone Encounter (Signed)
Urology referral placed. Please follow-up with the patient to make sure he contacted his general surgeon regarding the potential for a hernia so he can be seen by them.

## 2021-10-19 NOTE — Progress Notes (Signed)
H&P  Chief Complaint: Left scrotal swelling/pain  History of Present Illness: 61 year old male without prior urologic history is sent by Dr. Caryl Bis for evaluation and management of left scrotal swelling and pain.  He has a known history of an inguinal hernia, having been diagnosed with this at [facility].  Apparently he was scheduled for surgical management last year but delayed treatment.  He has had recent increased swelling in the left inguinal region, down into the scrotum recently.  Present sometimes with laying down, but mainly with sitting and standing.  It does cause some discomfort.  He has had no real change in lower urinary tract symptoms.  He does have a history of stage IV gastric cancer, now 5 years out from diagnosis and treatment with immunotherapy with no evidence of recurrence.  Past Medical History:  Diagnosis Date   Allergy    Anxiety    COVID-19    1/14, 05/24/20   Esophageal cancer ([place]) 10/21/2016   GE junction   GERD (gastroesophageal reflux disease)    Hyperglycemia    Hyperlipidemia    Hypertension    Sleep apnea    CPAP   SVT (supraventricular tachycardia) ([place])    2013   Thyroid disease    Tobacco abuse     Past Surgical History:  Procedure Laterality Date   COLONOSCOPY     KNEE ARTHROSCOPY Right 07/30/2021   Procedure: right knee arthroscopy, debridement, removal loose body;  Surgeon: Meredith Pel, MD;  Location: [facility];  Service: Orthopedics;  Laterality: Right;   LAPAROSCOPIC CHOLECYSTECTOMY  1991   SHOULDER SURGERY Right 1985, 1991, 2000   left x3   swidom teeth extraction     UPPER GASTROINTESTINAL ENDOSCOPY      Home Medications:  Allergies as of 10/20/2021   No Known Allergies      Medication List        Accurate as of October 19, 2021  7:43 PM. If you have any questions, ask your nurse or doctor.          ALPRAZolam 1 MG tablet Commonly known as: XANAX Take 1 mg by mouth 3 (three) times daily as needed for  anxiety.   amLODipine 5 MG tablet Commonly known as: NORVASC TAKE 1 TABLET BY MOUTH EVERY DAY   hydrocortisone 10 MG tablet Commonly known as: CORTEF Take 10-15 mg by mouth See admin instructions. Take 1.5 tablets (15 mg) by mouth in the morning (1 hour after levothyroxine) & take 1 tablet (10 mg) by mouth in the afternoon at 2 pm.   levothyroxine 88 MCG tablet Commonly known as: SYNTHROID Take 88 mcg by mouth daily before breakfast.   oxyCODONE-acetaminophen 5-325 MG tablet Commonly known as: Percocet Take 1 tablet by mouth every 4 (four) hours as needed for severe pain.   rosuvastatin 20 MG tablet Commonly known as: Crestor Take 1 tablet (20 mg total) by mouth daily.   testosterone cypionate 200 MG/ML injection Commonly known as: DEPOTESTOSTERONE CYPIONATE Inject into the muscle every Monday. 0.1ms   tiZANidine 4 MG tablet Commonly known as: Zanaflex Take 1 tablet (4 mg total) by mouth 3 (three) times daily.   verapamil 40 MG tablet Commonly known as: CALAN Take 1 tablet (40 mg total) by mouth 3 (three) times daily as needed.        Allergies: No Known Allergies  Family History  Problem Relation Age of Onset   Hypertension Mother    Hypertension Father    Pancreatic cancer Father  Clotting disorder Father        renal cell ca   Colon cancer Neg Hx    Esophageal cancer Neg Hx    Prostate cancer Neg Hx    Rectal cancer Neg Hx    Stomach cancer Neg Hx    Colon polyps Neg Hx     Social History:  reports that he has quit smoking. His smoking use included cigarettes. He has a 12.50 pack-year smoking history. He has never used smokeless tobacco. He reports that he does not currently use alcohol. He reports that he does not use drugs.  ROS: A complete review of systems was performed.  All systems are negative except for pertinent findings as noted.  Physical Exam:  Vital signs in last 24 hours: There were no vitals taken for this visit. Constitutional:   Alert and oriented, No acute distress Cardiovascular: Regular rate  Respiratory: Normal respiratory effort GI: Abdomen is soft, nontender, nondistended, no abdominal masses.  There is a left inguinal hernia, reducible, down into the left hemiscrotum. Genitourinary: Normal male phallus, testes are descended bilaterally and non-tender and without masses, scrotum is normal in appearance without lesions or masses, perineum is normal on inspection. Lymphatic: No lymphadenopathy Neurologic: Grossly intact, no focal deficits Psychiatric: Normal mood and affect  I have reviewed prior pt notes  I have reviewed notes from referring/previous physicians  I have reviewed urinalysis results  I have independently reviewed prior imaging--ultrasound images reviewed    Impression/Assessment:  Left scrotal pain from inguinal hernia that is enlarging  Plan:  I reassured the patient that I do not think he has a testicular abnormality  I did recommend that he see his surgeon at [facility] for further management  He will return here as needed

## 2021-10-20 ENCOUNTER — Ambulatory Visit: Payer: Medicare HMO | Admitting: Urology

## 2021-10-20 ENCOUNTER — Encounter: Payer: Self-pay | Admitting: Urology

## 2021-10-20 VITALS — BP 145/88 | HR 60 | Ht 68.0 in | Wt 240.0 lb

## 2021-10-20 DIAGNOSIS — K4090 Unilateral inguinal hernia, without obstruction or gangrene, not specified as recurrent: Secondary | ICD-10-CM

## 2021-10-20 DIAGNOSIS — N433 Hydrocele, unspecified: Secondary | ICD-10-CM

## 2021-10-20 DIAGNOSIS — N50812 Left testicular pain: Secondary | ICD-10-CM | POA: Diagnosis not present

## 2021-10-21 LAB — URINALYSIS, ROUTINE W REFLEX MICROSCOPIC
Bilirubin, UA: NEGATIVE
Glucose, UA: NEGATIVE
Ketones, UA: NEGATIVE
Leukocytes,UA: NEGATIVE
Nitrite, UA: NEGATIVE
Protein,UA: NEGATIVE
Specific Gravity, UA: 1.02 (ref 1.005–1.030)
Urobilinogen, Ur: 0.2 mg/dL (ref 0.2–1.0)
pH, UA: 5 (ref 5.0–7.5)

## 2021-10-21 LAB — MICROSCOPIC EXAMINATION
Bacteria, UA: NONE SEEN
Epithelial Cells (non renal): NONE SEEN /hpf (ref 0–10)

## 2021-11-16 DIAGNOSIS — E038 Other specified hypothyroidism: Secondary | ICD-10-CM | POA: Diagnosis not present

## 2021-11-16 DIAGNOSIS — Z807 Family history of other malignant neoplasms of lymphoid, hematopoietic and related tissues: Secondary | ICD-10-CM | POA: Diagnosis not present

## 2021-11-16 DIAGNOSIS — Z87891 Personal history of nicotine dependence: Secondary | ICD-10-CM | POA: Diagnosis not present

## 2021-11-16 DIAGNOSIS — K4020 Bilateral inguinal hernia, without obstruction or gangrene, not specified as recurrent: Secondary | ICD-10-CM | POA: Diagnosis not present

## 2021-11-16 DIAGNOSIS — Z9049 Acquired absence of other specified parts of digestive tract: Secondary | ICD-10-CM | POA: Diagnosis not present

## 2021-11-16 DIAGNOSIS — R918 Other nonspecific abnormal finding of lung field: Secondary | ICD-10-CM | POA: Diagnosis not present

## 2021-11-16 DIAGNOSIS — T451X5A Adverse effect of antineoplastic and immunosuppressive drugs, initial encounter: Secondary | ICD-10-CM | POA: Diagnosis not present

## 2021-11-16 DIAGNOSIS — Z79899 Other long term (current) drug therapy: Secondary | ICD-10-CM | POA: Diagnosis not present

## 2021-11-16 DIAGNOSIS — C160 Malignant neoplasm of cardia: Secondary | ICD-10-CM | POA: Diagnosis not present

## 2021-11-16 DIAGNOSIS — Z8501 Personal history of malignant neoplasm of esophagus: Secondary | ICD-10-CM | POA: Diagnosis not present

## 2021-11-16 DIAGNOSIS — E291 Testicular hypofunction: Secondary | ICD-10-CM | POA: Diagnosis not present

## 2021-11-16 DIAGNOSIS — Z08 Encounter for follow-up examination after completed treatment for malignant neoplasm: Secondary | ICD-10-CM | POA: Diagnosis not present

## 2021-11-16 DIAGNOSIS — E236 Other disorders of pituitary gland: Secondary | ICD-10-CM | POA: Diagnosis not present

## 2021-11-16 DIAGNOSIS — F419 Anxiety disorder, unspecified: Secondary | ICD-10-CM | POA: Diagnosis not present

## 2021-11-16 DIAGNOSIS — E2749 Other adrenocortical insufficiency: Secondary | ICD-10-CM | POA: Diagnosis not present

## 2021-11-16 DIAGNOSIS — C159 Malignant neoplasm of esophagus, unspecified: Secondary | ICD-10-CM | POA: Diagnosis not present

## 2021-11-16 DIAGNOSIS — D333 Benign neoplasm of cranial nerves: Secondary | ICD-10-CM | POA: Diagnosis not present

## 2021-11-18 DIAGNOSIS — C160 Malignant neoplasm of cardia: Secondary | ICD-10-CM | POA: Diagnosis not present

## 2021-11-18 DIAGNOSIS — E785 Hyperlipidemia, unspecified: Secondary | ICD-10-CM | POA: Diagnosis not present

## 2021-11-18 DIAGNOSIS — E2740 Unspecified adrenocortical insufficiency: Secondary | ICD-10-CM | POA: Diagnosis not present

## 2021-11-18 DIAGNOSIS — Z79890 Hormone replacement therapy (postmenopausal): Secondary | ICD-10-CM | POA: Diagnosis not present

## 2021-11-18 DIAGNOSIS — Z7952 Long term (current) use of systemic steroids: Secondary | ICD-10-CM | POA: Diagnosis not present

## 2021-11-18 DIAGNOSIS — Z87891 Personal history of nicotine dependence: Secondary | ICD-10-CM | POA: Diagnosis not present

## 2021-11-18 DIAGNOSIS — K429 Umbilical hernia without obstruction or gangrene: Secondary | ICD-10-CM | POA: Diagnosis not present

## 2021-11-18 DIAGNOSIS — Z9049 Acquired absence of other specified parts of digestive tract: Secondary | ICD-10-CM | POA: Diagnosis not present

## 2021-11-18 DIAGNOSIS — I10 Essential (primary) hypertension: Secondary | ICD-10-CM | POA: Diagnosis not present

## 2021-11-18 DIAGNOSIS — K4020 Bilateral inguinal hernia, without obstruction or gangrene, not specified as recurrent: Secondary | ICD-10-CM | POA: Diagnosis not present

## 2021-12-03 DIAGNOSIS — R69 Illness, unspecified: Secondary | ICD-10-CM | POA: Diagnosis not present

## 2021-12-03 DIAGNOSIS — F411 Generalized anxiety disorder: Secondary | ICD-10-CM | POA: Diagnosis not present

## 2021-12-08 ENCOUNTER — Telehealth: Payer: Self-pay

## 2021-12-08 NOTE — Telephone Encounter (Signed)
[employer] paperwork received and completed by MD.submitted back via fax [phone]

## 2021-12-18 DIAGNOSIS — E2740 Unspecified adrenocortical insufficiency: Secondary | ICD-10-CM | POA: Diagnosis not present

## 2021-12-22 ENCOUNTER — Encounter: Payer: Self-pay | Admitting: Family Medicine

## 2021-12-22 ENCOUNTER — Telehealth (INDEPENDENT_AMBULATORY_CARE_PROVIDER_SITE_OTHER): Payer: Medicare HMO | Admitting: Family Medicine

## 2021-12-22 DIAGNOSIS — K4090 Unilateral inguinal hernia, without obstruction or gangrene, not specified as recurrent: Secondary | ICD-10-CM | POA: Diagnosis not present

## 2021-12-22 DIAGNOSIS — F419 Anxiety disorder, unspecified: Secondary | ICD-10-CM | POA: Diagnosis not present

## 2021-12-22 DIAGNOSIS — E2740 Unspecified adrenocortical insufficiency: Secondary | ICD-10-CM | POA: Diagnosis not present

## 2021-12-22 DIAGNOSIS — I10 Essential (primary) hypertension: Secondary | ICD-10-CM | POA: Diagnosis not present

## 2021-12-22 DIAGNOSIS — R69 Illness, unspecified: Secondary | ICD-10-CM | POA: Diagnosis not present

## 2021-12-22 NOTE — Assessment & Plan Note (Signed)
>>  ASSESSMENT AND PLAN FOR ANXIETY WRITTEN ON 12/22/2021 10:27 AM BY SONNENBERG, ERIC G, MD  Generally stable.  He will continue management by psychiatry.

## 2021-12-22 NOTE — Assessment & Plan Note (Signed)
Generally stable.  He will continue management by psychiatry.

## 2021-12-22 NOTE — Assessment & Plan Note (Signed)
Scheduled for surgery next week.

## 2021-12-22 NOTE — Progress Notes (Signed)
Virtual Visit via telephone Note  This visit type was conducted due to national recommendations for restrictions regarding the COVID-19 pandemic (e.g. social distancing).  This format is felt to be most appropriate for this patient at this time.  All issues noted in this document were discussed and addressed.  No physical exam was performed (except for noted visual exam findings with Video Visits).   I connected with Jose Hayes today at 10:00 AM EDT by telephone and verified that I am speaking with the correct person using two identifiers. Location patient: home Location provider: work  Persons participating in the virtual visit: patient, provider  I discussed the limitations, risks, security and privacy concerns of performing an evaluation and management service by telephone and the availability of in person appointments. I also discussed with the patient that there may be a patient responsible charge related to this service. The patient expressed understanding and agreed to proceed.  Interactive audio and video telecommunications were attempted between this provider and patient, however failed, due to patient having technical difficulties OR patient did not have access to video capability.  We continued and completed visit with audio only.   Reason for visit: f/u.  HPI: Hypertension: Patient reports he has not been checking at home.  He is taking amlodipine.  No chest pain or shortness of breath.  Inguinal hernia: This is a chronic issue.  He is scheduled for surgery on 8/24.  He notes the symptoms have improved a little bit as he has been taking it easy recently.  Adrenal insufficiency: Patient has been following with endocrinology for this.  They recently switched him over to prednisone to see if that would provide better benefit than hydrocortisone.  He had anxiety issues with prednisone in the past though he has better control on his anxiety now.  He notes thus far they have not  had any explanation for why he feels different with his fatigue 1 day to the other.  Anxiety: He is on Cymbalta, BuSpar, and Xanax.  He is following with psychiatry.   ROS: See pertinent positives and negatives per HPI.  Past Medical History:  Diagnosis Date   Allergy    Anxiety    COVID-19    1/14, 05/24/20   Esophageal cancer ([place]) 10/21/2016   GE junction   GERD (gastroesophageal reflux disease)    Hyperglycemia    Hyperlipidemia    Hypertension    Sleep apnea    CPAP   SVT (supraventricular tachycardia) ([place])    2013   Thyroid disease    Tobacco abuse     Past Surgical History:  Procedure Laterality Date   COLONOSCOPY     KNEE ARTHROSCOPY Right 07/30/2021   Procedure: right knee arthroscopy, debridement, removal loose body;  Surgeon: Meredith Pel, MD;  Location: [place];  Service: Orthopedics;  Laterality: Right;   LAPAROSCOPIC CHOLECYSTECTOMY  1991   SHOULDER SURGERY Right 1985, 1991, 2000   left x3   swidom teeth extraction     UPPER GASTROINTESTINAL ENDOSCOPY      Family History  Problem Relation Age of Onset   Hypertension Mother    Hypertension Father    Pancreatic cancer Father    Clotting disorder Father        renal cell ca   Colon cancer Neg Hx    Esophageal cancer Neg Hx    Prostate cancer Neg Hx    Rectal cancer Neg Hx    Stomach cancer Neg Hx  Colon polyps Neg Hx     SOCIAL HX: Former smoker   Current Outpatient Medications:    ALPRAZolam (XANAX) 1 MG tablet, Take 1 mg by mouth 3 (three) times daily as needed for anxiety., Disp: , Rfl:    amLODipine (NORVASC) 5 MG tablet, TAKE 1 TABLET BY MOUTH EVERY DAY, Disp: 90 tablet, Rfl: 3   hydrocortisone (CORTEF) 10 MG tablet, Take 10-15 mg by mouth See admin instructions. Take 1.5 tablets (15 mg) by mouth in the morning (1 hour after levothyroxine) & take 1 tablet (10 mg) by mouth in the afternoon at 2 pm., Disp: , Rfl:    levothyroxine (SYNTHROID) 88 MCG tablet, Take 88 mcg by mouth daily  before breakfast., Disp: , Rfl:    oxyCODONE-acetaminophen (PERCOCET) 5-325 MG tablet, Take 1 tablet by mouth every 4 (four) hours as needed for severe pain., Disp: 30 tablet, Rfl: 0   rosuvastatin (CRESTOR) 20 MG tablet, Take 1 tablet (20 mg total) by mouth daily., Disp: 100 tablet, Rfl: 3   testosterone cypionate (DEPOTESTOSTERONE CYPIONATE) 200 MG/ML injection, Inject into the muscle every Monday. 0.49ms, Disp: , Rfl:    tiZANidine (ZANAFLEX) 4 MG tablet, Take 1 tablet (4 mg total) by mouth 3 (three) times daily., Disp: 30 tablet, Rfl: 0   verapamil (CALAN) 40 MG tablet, Take 1 tablet (40 mg total) by mouth 3 (three) times daily as needed., Disp: 90 tablet, Rfl: 1  EXAM: This was a telephone visit and thus no exam was completed.  ASSESSMENT AND PLAN:  Discussed the following assessment and plan:  Problem List Items Addressed This Visit     Adrenal insufficiency ([place] (Chronic)    He will continue treatment through his endocrinologist.      Anxiety (Chronic)    Generally stable.  He will continue management by psychiatry.      Hypertension (Chronic)    Patient will start checking his blood pressure at home.  Discussed goal of less than 130/80.  He will continue amlodipine 5 mg daily.  He will contact me if his blood pressure is running above 130/80 consistently.      Inguinal hernia (Chronic)    Scheduled for surgery next week.       Return in about 6 months (around 06/24/2022) for htn.   I discussed the assessment and treatment plan with the patient. The patient was provided an opportunity to ask questions and all were answered. The patient agreed with the plan and demonstrated an understanding of the instructions.   The patient was advised to call back or seek an in-person evaluation if the symptoms worsen or if the condition fails to improve as anticipated.  I provided 11 minutes of non-face-to-face time during this encounter.   [name] MD

## 2021-12-22 NOTE — Assessment & Plan Note (Signed)
Patient will start checking his blood pressure at home.  Discussed goal of less than 130/80.  He will continue amlodipine 5 mg daily.  He will contact me if his blood pressure is running above 130/80 consistently.

## 2021-12-22 NOTE — Assessment & Plan Note (Signed)
He will continue treatment through his endocrinologist.

## 2021-12-23 DIAGNOSIS — K4020 Bilateral inguinal hernia, without obstruction or gangrene, not specified as recurrent: Secondary | ICD-10-CM | POA: Diagnosis not present

## 2021-12-23 DIAGNOSIS — E038 Other specified hypothyroidism: Secondary | ICD-10-CM | POA: Diagnosis not present

## 2021-12-23 DIAGNOSIS — F411 Generalized anxiety disorder: Secondary | ICD-10-CM | POA: Diagnosis not present

## 2021-12-23 DIAGNOSIS — Z01818 Encounter for other preprocedural examination: Secondary | ICD-10-CM | POA: Diagnosis not present

## 2021-12-23 DIAGNOSIS — E273 Drug-induced adrenocortical insufficiency: Secondary | ICD-10-CM | POA: Diagnosis not present

## 2021-12-23 DIAGNOSIS — I10 Essential (primary) hypertension: Secondary | ICD-10-CM | POA: Diagnosis not present

## 2021-12-23 DIAGNOSIS — E291 Testicular hypofunction: Secondary | ICD-10-CM | POA: Diagnosis not present

## 2021-12-23 DIAGNOSIS — R7303 Prediabetes: Secondary | ICD-10-CM | POA: Diagnosis not present

## 2021-12-23 DIAGNOSIS — G4733 Obstructive sleep apnea (adult) (pediatric): Secondary | ICD-10-CM | POA: Diagnosis not present

## 2021-12-23 DIAGNOSIS — D333 Benign neoplasm of cranial nerves: Secondary | ICD-10-CM | POA: Diagnosis not present

## 2021-12-23 DIAGNOSIS — E785 Hyperlipidemia, unspecified: Secondary | ICD-10-CM | POA: Diagnosis not present

## 2021-12-31 DIAGNOSIS — Z79899 Other long term (current) drug therapy: Secondary | ICD-10-CM | POA: Diagnosis not present

## 2021-12-31 DIAGNOSIS — Z9989 Dependence on other enabling machines and devices: Secondary | ICD-10-CM | POA: Diagnosis not present

## 2021-12-31 DIAGNOSIS — E2740 Unspecified adrenocortical insufficiency: Secondary | ICD-10-CM | POA: Diagnosis not present

## 2021-12-31 DIAGNOSIS — G4730 Sleep apnea, unspecified: Secondary | ICD-10-CM | POA: Diagnosis not present

## 2021-12-31 DIAGNOSIS — E785 Hyperlipidemia, unspecified: Secondary | ICD-10-CM | POA: Diagnosis not present

## 2021-12-31 DIAGNOSIS — K4090 Unilateral inguinal hernia, without obstruction or gangrene, not specified as recurrent: Secondary | ICD-10-CM | POA: Diagnosis not present

## 2021-12-31 DIAGNOSIS — E669 Obesity, unspecified: Secondary | ICD-10-CM | POA: Diagnosis not present

## 2021-12-31 DIAGNOSIS — Z79890 Hormone replacement therapy (postmenopausal): Secondary | ICD-10-CM | POA: Diagnosis not present

## 2021-12-31 DIAGNOSIS — K219 Gastro-esophageal reflux disease without esophagitis: Secondary | ICD-10-CM | POA: Diagnosis not present

## 2021-12-31 DIAGNOSIS — Z87891 Personal history of nicotine dependence: Secondary | ICD-10-CM | POA: Diagnosis not present

## 2021-12-31 DIAGNOSIS — Z6837 Body mass index (BMI) 37.0-37.9, adult: Secondary | ICD-10-CM | POA: Diagnosis not present

## 2021-12-31 DIAGNOSIS — K4000 Bilateral inguinal hernia, with obstruction, without gangrene, not specified as recurrent: Secondary | ICD-10-CM | POA: Diagnosis not present

## 2021-12-31 DIAGNOSIS — D176 Benign lipomatous neoplasm of spermatic cord: Secondary | ICD-10-CM | POA: Diagnosis not present

## 2021-12-31 DIAGNOSIS — K429 Umbilical hernia without obstruction or gangrene: Secondary | ICD-10-CM | POA: Diagnosis not present

## 2021-12-31 DIAGNOSIS — C160 Malignant neoplasm of cardia: Secondary | ICD-10-CM | POA: Diagnosis not present

## 2021-12-31 DIAGNOSIS — G4733 Obstructive sleep apnea (adult) (pediatric): Secondary | ICD-10-CM | POA: Diagnosis not present

## 2021-12-31 DIAGNOSIS — I10 Essential (primary) hypertension: Secondary | ICD-10-CM | POA: Diagnosis not present

## 2022-01-19 DIAGNOSIS — D333 Benign neoplasm of cranial nerves: Secondary | ICD-10-CM | POA: Diagnosis not present

## 2022-01-19 DIAGNOSIS — H903 Sensorineural hearing loss, bilateral: Secondary | ICD-10-CM | POA: Diagnosis not present

## 2022-01-19 DIAGNOSIS — H9313 Tinnitus, bilateral: Secondary | ICD-10-CM | POA: Diagnosis not present

## 2022-01-20 DIAGNOSIS — K4020 Bilateral inguinal hernia, without obstruction or gangrene, not specified as recurrent: Secondary | ICD-10-CM | POA: Diagnosis not present

## 2022-01-26 DIAGNOSIS — F411 Generalized anxiety disorder: Secondary | ICD-10-CM | POA: Diagnosis not present

## 2022-01-26 DIAGNOSIS — R69 Illness, unspecified: Secondary | ICD-10-CM | POA: Diagnosis not present

## 2022-02-10 ENCOUNTER — Telehealth: Payer: Self-pay

## 2022-02-10 NOTE — Telephone Encounter (Signed)
LMTCB to schedule follow up.

## 2022-02-22 DIAGNOSIS — R079 Chest pain, unspecified: Secondary | ICD-10-CM | POA: Diagnosis not present

## 2022-03-11 DIAGNOSIS — R69 Illness, unspecified: Secondary | ICD-10-CM | POA: Diagnosis not present

## 2022-03-11 DIAGNOSIS — F411 Generalized anxiety disorder: Secondary | ICD-10-CM | POA: Diagnosis not present

## 2022-03-26 DIAGNOSIS — D333 Benign neoplasm of cranial nerves: Secondary | ICD-10-CM | POA: Diagnosis not present

## 2022-05-02 ENCOUNTER — Inpatient Hospital Stay
Admission: EM | Admit: 2022-05-02 | Discharge: 2022-05-04 | DRG: 071 | Disposition: A | Discharge status: Home / Self Care | Payer: Medicare HMO | Attending: Hospitalist | Admitting: Hospitalist

## 2022-05-02 ENCOUNTER — Emergency Department: Payer: Medicare HMO

## 2022-05-02 ENCOUNTER — Other Ambulatory Visit: Payer: Self-pay

## 2022-05-02 DIAGNOSIS — E231 Drug-induced hypopituitarism: Secondary | ICD-10-CM | POA: Diagnosis present

## 2022-05-02 DIAGNOSIS — E039 Hypothyroidism, unspecified: Secondary | ICD-10-CM | POA: Diagnosis present

## 2022-05-02 DIAGNOSIS — E273 Drug-induced adrenocortical insufficiency: Secondary | ICD-10-CM | POA: Diagnosis present

## 2022-05-02 DIAGNOSIS — G9341 Metabolic encephalopathy: Principal | ICD-10-CM | POA: Diagnosis present

## 2022-05-02 DIAGNOSIS — R4182 Altered mental status, unspecified: Secondary | ICD-10-CM | POA: Diagnosis present

## 2022-05-02 DIAGNOSIS — F419 Anxiety disorder, unspecified: Secondary | ICD-10-CM | POA: Diagnosis not present

## 2022-05-02 DIAGNOSIS — Z79899 Other long term (current) drug therapy: Secondary | ICD-10-CM

## 2022-05-02 DIAGNOSIS — R531 Weakness: Secondary | ICD-10-CM | POA: Diagnosis not present

## 2022-05-02 DIAGNOSIS — I10 Essential (primary) hypertension: Secondary | ICD-10-CM | POA: Diagnosis present

## 2022-05-02 DIAGNOSIS — Z9049 Acquired absence of other specified parts of digestive tract: Secondary | ICD-10-CM

## 2022-05-02 DIAGNOSIS — Z87891 Personal history of nicotine dependence: Secondary | ICD-10-CM

## 2022-05-02 DIAGNOSIS — T387X6A Underdosing of androgens and anabolic congeners, initial encounter: Secondary | ICD-10-CM | POA: Diagnosis not present

## 2022-05-02 DIAGNOSIS — Z7952 Long term (current) use of systemic steroids: Secondary | ICD-10-CM | POA: Diagnosis not present

## 2022-05-02 DIAGNOSIS — Z1152 Encounter for screening for COVID-19: Secondary | ICD-10-CM | POA: Diagnosis not present

## 2022-05-02 DIAGNOSIS — J101 Influenza due to other identified influenza virus with other respiratory manifestations: Secondary | ICD-10-CM | POA: Diagnosis present

## 2022-05-02 DIAGNOSIS — D333 Benign neoplasm of cranial nerves: Secondary | ICD-10-CM | POA: Diagnosis not present

## 2022-05-02 DIAGNOSIS — E785 Hyperlipidemia, unspecified: Secondary | ICD-10-CM | POA: Diagnosis not present

## 2022-05-02 DIAGNOSIS — Z8616 Personal history of COVID-19: Secondary | ICD-10-CM

## 2022-05-02 DIAGNOSIS — Z91128 Patient's intentional underdosing of medication regimen for other reason: Secondary | ICD-10-CM

## 2022-05-02 DIAGNOSIS — Z9989 Dependence on other enabling machines and devices: Secondary | ICD-10-CM | POA: Diagnosis not present

## 2022-05-02 DIAGNOSIS — R509 Fever, unspecified: Secondary | ICD-10-CM | POA: Diagnosis not present

## 2022-05-02 DIAGNOSIS — G4733 Obstructive sleep apnea (adult) (pediatric): Secondary | ICD-10-CM | POA: Diagnosis present

## 2022-05-02 DIAGNOSIS — T451X5S Adverse effect of antineoplastic and immunosuppressive drugs, sequela: Secondary | ICD-10-CM | POA: Diagnosis not present

## 2022-05-02 DIAGNOSIS — R9431 Abnormal electrocardiogram [ECG] [EKG]: Secondary | ICD-10-CM | POA: Diagnosis not present

## 2022-05-02 DIAGNOSIS — Z8249 Family history of ischemic heart disease and other diseases of the circulatory system: Secondary | ICD-10-CM

## 2022-05-02 DIAGNOSIS — I3139 Other pericardial effusion (noninflammatory): Secondary | ICD-10-CM | POA: Diagnosis not present

## 2022-05-02 DIAGNOSIS — E272 Addisonian crisis: Secondary | ICD-10-CM | POA: Diagnosis not present

## 2022-05-02 DIAGNOSIS — Z8501 Personal history of malignant neoplasm of esophagus: Secondary | ICD-10-CM

## 2022-05-02 DIAGNOSIS — Z79890 Hormone replacement therapy (postmenopausal): Secondary | ICD-10-CM

## 2022-05-02 DIAGNOSIS — K219 Gastro-esophageal reflux disease without esophagitis: Secondary | ICD-10-CM | POA: Diagnosis present

## 2022-05-02 DIAGNOSIS — A419 Sepsis, unspecified organism: Secondary | ICD-10-CM | POA: Diagnosis not present

## 2022-05-02 DIAGNOSIS — I6523 Occlusion and stenosis of bilateral carotid arteries: Secondary | ICD-10-CM | POA: Diagnosis not present

## 2022-05-02 DIAGNOSIS — J320 Chronic maxillary sinusitis: Secondary | ICD-10-CM | POA: Diagnosis not present

## 2022-05-02 DIAGNOSIS — I471 Supraventricular tachycardia, unspecified: Secondary | ICD-10-CM | POA: Diagnosis present

## 2022-05-02 DIAGNOSIS — E2740 Unspecified adrenocortical insufficiency: Secondary | ICD-10-CM | POA: Diagnosis present

## 2022-05-02 DIAGNOSIS — R404 Transient alteration of awareness: Secondary | ICD-10-CM | POA: Diagnosis not present

## 2022-05-02 DIAGNOSIS — R4701 Aphasia: Secondary | ICD-10-CM | POA: Diagnosis present

## 2022-05-02 HISTORY — DX: Primary adrenocortical insufficiency: E27.1

## 2022-05-02 HISTORY — DX: Benign neoplasm of cranial nerves: D33.3

## 2022-05-02 HISTORY — DX: Altered mental status, unspecified: R41.82

## 2022-05-02 LAB — RESP PANEL BY RT-PCR (RSV, FLU A&B, COVID)  RVPGX2
Influenza A by PCR: NEGATIVE
Influenza B by PCR: NEGATIVE
Resp Syncytial Virus by PCR: NEGATIVE
SARS Coronavirus 2 by RT PCR: NEGATIVE

## 2022-05-02 LAB — COMPREHENSIVE METABOLIC PANEL
ALT: 28 U/L (ref 0–44)
AST: 34 U/L (ref 15–41)
Albumin: 4.6 g/dL (ref 3.5–5.0)
Alkaline Phosphatase: 51 U/L (ref 38–126)
Anion gap: 10 (ref 5–15)
BUN: 20 mg/dL (ref 8–23)
CO2: 24 mmol/L (ref 22–32)
Calcium: 8.6 mg/dL — ABNORMAL LOW (ref 8.9–10.3)
Chloride: 101 mmol/L (ref 98–111)
Creatinine, Ser: 1.45 mg/dL — ABNORMAL HIGH (ref 0.61–1.24)
GFR, Estimated: 55 mL/min — ABNORMAL LOW (ref >60)
Glucose, Bld: 102 mg/dL — ABNORMAL HIGH (ref 70–99)
Potassium: 3.5 mmol/L (ref 3.5–5.1)
Sodium: 135 mmol/L (ref 135–145)
Total Bilirubin: 1.2 mg/dL (ref 0.3–1.2)
Total Protein: 7.4 g/dL (ref 6.5–8.1)

## 2022-05-02 LAB — CBC
HCT: 45.4 % (ref 39.0–52.0)
Hemoglobin: 14.9 g/dL (ref 13.0–17.0)
MCH: 28.5 pg (ref 26.0–34.0)
MCHC: 32.8 g/dL (ref 30.0–36.0)
MCV: 87 fL (ref 80.0–100.0)
Platelets: 162 10*3/uL (ref 150–400)
RBC: 5.22 MIL/uL (ref 4.22–5.81)
RDW: 14 % (ref 11.5–15.5)
WBC: 5.7 10*3/uL (ref 4.0–10.5)
nRBC: 0 % (ref 0.0–0.2)

## 2022-05-02 LAB — PROTIME-INR
INR: 1.2 (ref 0.8–1.2)
Prothrombin Time: 14.6 seconds (ref 11.4–15.2)

## 2022-05-02 LAB — DIFFERENTIAL
Abs Immature Granulocytes: 0.01 10*3/uL (ref 0.00–0.07)
Basophils Absolute: 0 10*3/uL (ref 0.0–0.1)
Basophils Relative: 1 %
Eosinophils Absolute: 0 10*3/uL (ref 0.0–0.5)
Eosinophils Relative: 0 %
Immature Granulocytes: 0 %
Lymphocytes Relative: 17 %
Lymphs Abs: 0.9 10*3/uL (ref 0.7–4.0)
Monocytes Absolute: 1.1 10*3/uL — ABNORMAL HIGH (ref 0.1–1.0)
Monocytes Relative: 19 %
Neutro Abs: 3.6 10*3/uL (ref 1.7–7.7)
Neutrophils Relative %: 63 %

## 2022-05-02 LAB — ETHANOL: Alcohol, Ethyl (B): <10 mg/dL (ref <10)

## 2022-05-02 LAB — LACTIC ACID, PLASMA: Lactic Acid, Venous: 1.5 mmol/L (ref 0.5–1.9)

## 2022-05-02 LAB — APTT: aPTT: 34 seconds (ref 24–36)

## 2022-05-02 LAB — CBG MONITORING, ED: Glucose-Capillary: 103 mg/dL — ABNORMAL HIGH (ref 70–99)

## 2022-05-02 MED ORDER — HYDROCORTISONE SOD SUC (PF) 100 MG IJ SOLR
25.0000 mg | Freq: Once | INTRAMUSCULAR | Status: AC
  Administered 2022-05-03: 25 mg via INTRAVENOUS
  Filled 2022-05-02: qty 0.5

## 2022-05-02 MED ORDER — SODIUM CHLORIDE 0.9% FLUSH
3.0000 mL | Freq: Once | INTRAVENOUS | Status: AC
  Administered 2022-05-02: 3 mL via INTRAVENOUS

## 2022-05-02 MED ORDER — ACETAMINOPHEN 325 MG RE SUPP
650.0000 mg | Freq: Once | RECTAL | Status: AC
  Administered 2022-05-02: 650 mg via RECTAL
  Filled 2022-05-02: qty 2

## 2022-05-02 MED ORDER — DEXTROSE 5 % IV SOLN
10.0000 mg/kg | Freq: Once | INTRAVENOUS | Status: AC
  Administered 2022-05-02: 860 mg via INTRAVENOUS
  Filled 2022-05-02: qty 17.2

## 2022-05-02 MED ORDER — SODIUM CHLORIDE 0.9 % IV SOLN
2.0000 g | Freq: Once | INTRAVENOUS | Status: AC
  Administered 2022-05-02: 2 g via INTRAVENOUS
  Filled 2022-05-02: qty 20

## 2022-05-02 MED ORDER — VANCOMYCIN HCL IN DEXTROSE 1-5 GM/200ML-% IV SOLN: 1000.0000 mg | Freq: Once | INTRAVENOUS | Status: DC

## 2022-05-02 MED ORDER — SODIUM CHLORIDE 0.9 % IV SOLN
2.0000 g | Freq: Once | INTRAVENOUS | Status: AC
  Administered 2022-05-02: 2 g via INTRAVENOUS
  Filled 2022-05-02: qty 2000

## 2022-05-02 MED ORDER — VANCOMYCIN HCL 1500 MG/300ML IV SOLN
1500.0000 mg | Freq: Once | INTRAVENOUS | Status: DC
  Filled 2022-05-02: qty 300

## 2022-05-02 MED ORDER — LORAZEPAM 2 MG/ML IJ SOLN: 0.5000 mg | Freq: Once | INTRAMUSCULAR | Status: AC

## 2022-05-02 MED ORDER — VANCOMYCIN HCL 500 MG/100ML IV SOLN
500.0000 mg | Freq: Once | INTRAVENOUS | Status: AC
  Administered 2022-05-03: 500 mg via INTRAVENOUS
  Filled 2022-05-02: qty 100

## 2022-05-02 MED ORDER — VANCOMYCIN HCL 2000 MG/400ML IV SOLN
2000.0000 mg | Freq: Once | INTRAVENOUS | Status: AC
  Administered 2022-05-02: 2000 mg via INTRAVENOUS
  Filled 2022-05-02: qty 400

## 2022-05-02 MED ORDER — DEXAMETHASONE SODIUM PHOSPHATE 10 MG/ML IJ SOLN
10.0000 mg | Freq: Once | INTRAMUSCULAR | Status: AC
  Administered 2022-05-02: 10 mg via INTRAVENOUS
  Filled 2022-05-02: qty 1

## 2022-05-02 MED ORDER — IOHEXOL 350 MG/ML SOLN
100.0000 mL | Freq: Once | INTRAVENOUS | Status: AC | PRN
  Administered 2022-05-02: 100 mL via INTRAVENOUS

## 2022-05-02 MED ORDER — LORAZEPAM 2 MG/ML IJ SOLN
INTRAMUSCULAR | Status: AC
  Administered 2022-05-02: 0.5 mg via INTRAVENOUS
  Filled 2022-05-02: qty 1

## 2022-05-02 NOTE — Code Documentation (Signed)
CODE SEPSIS - PHARMACY COMMUNICATION  **Broad Spectrum Antibiotics should be administered within 1 hour of Sepsis diagnosis**  Time Code Sepsis Called/Page Received: 2124  Antibiotics Ordered: 1948  Time of 1st antibiotic administration: 2135  Darrick Penna ,PharmD Clinical Pharmacist  05/02/2022  9:35 PM

## 2022-05-02 NOTE — ED Notes (Signed)
ED provider Nickolas Madrid MD and teleneurologist at bedside to assess patient

## 2022-05-02 NOTE — Consult Note (Addendum)
NEUROLOGY TELECONSULTATION NOTE   Date of service: May 02, 2022 Patient Name: Jose Hayes MRN:  [id number] DOB:  05/16/1960 Reason for consult: aphasia  Requesting Provider: Dr. Marjean Donna Consult Participants: myself, patient, wife, bedside RN, telestroke RN Location of the provider: [facility], [place] Location of the patient: [facility]  This consult was provided via telemedicine with 2-way video and audio communication. The patient/family was informed that care would be provided in this way and agreed to receive care in this manner.   _ _ _   _ __   _ __ _ _  __ __   _ __   __ _  History of Present Illness   This is a 61 yo man with pmhx HL, HTN, OSA, SVT, thyroid disease, tobacco abuse, esophageal cancer (2018) who presents with aphasia. He woke up unable to speak or follow commands at 1500. LKW was before he went to sleep sometime this AM. He has had flu like sx since yesterday. He will not hold his arms against gravity on my exam although he was able to in triage and was reported to have slightly weaker grip on the left. NIHSS = 21. CT head no acute process, ASPECTS 10. CTA showed no LVO and CT perfusion was negative. All CNS imaging personally reviewed and discussed by phone with neuroradiology.   ROS   UTA 2/2 aphasia  Past History   The following was personally reviewed:  Past Medical History:  Diagnosis Date   Allergy    Anxiety    COVID-19    1/14, 05/24/20   Esophageal cancer ([place]) 10/21/2016   GE junction   GERD (gastroesophageal reflux disease)    Hyperglycemia    Hyperlipidemia    Hypertension    Sleep apnea    CPAP   SVT (supraventricular tachycardia) ([place])    2013   Thyroid disease    Tobacco abuse    Past Surgical History:  Procedure Laterality Date   COLONOSCOPY     KNEE ARTHROSCOPY Right 07/30/2021   Procedure: right knee arthroscopy, debridement, removal loose body;  Surgeon: Meredith Pel, MD;  Location: [place];  Service: Orthopedics;   Laterality: Right;   [place] Right 1985, 1991, 2000   left x3   swidom teeth extraction     UPPER GASTROINTESTINAL ENDOSCOPY     Family History  Problem Relation Age of Onset   Hypertension Mother    Hypertension Father    Pancreatic cancer Father    Clotting disorder Father        renal cell ca   Colon cancer Neg Hx    Esophageal cancer Neg Hx    Prostate cancer Neg Hx    Rectal cancer Neg Hx    Stomach cancer Neg Hx    Colon polyps Neg Hx    Social History   Socioeconomic History   Marital status: Married    Spouse name: Not on file   Number of children: Not on file   Years of education: Not on file   Highest education level: Not on file  Occupational History   Not on file  Tobacco Use   Smoking status: Former    Packs/day: 0.50    Years: 25.00    Total pack years: 12.50    Types: Cigarettes   Smokeless tobacco: Never   Tobacco comments:    trying gum to quit  Vaping Use   Vaping Use: Former  Substance and  Sexual Activity   Alcohol use: Not Currently    Comment: occasionally   Drug use: No   Sexual activity: Not on file  Other Topics Concern   Not on file  Social History Narrative   Not on file   Social Determinants of Health   Financial Resource Strain: Low Risk  (06/26/2021)   Overall Financial Resource Strain (CARDIA)    Difficulty of Paying Living Expenses: Not hard at all  Food Insecurity: No Food Insecurity (06/26/2021)   Hunger Vital Sign    Worried About Running Out of Food in the Last Year: Never true    Ran Out of Food in the Last Year: Never true  Transportation Needs: No Transportation Needs (06/26/2021)   PRAPARE - Hydrologist (Medical): No    Lack of Transportation (Non-Medical): No  Physical Activity: Not on file  Stress: No Stress Concern Present (06/26/2021)   [place]    Feeling of Stress :  Only a little  Social Connections: Unknown (06/26/2021)   Social Connection and Isolation Panel [NHANES]    Frequency of Communication with Friends and Family: Not on file    Frequency of Social Gatherings with Friends and Family: Not on file    Attends Religious Services: Not on file    Active Member of Clubs or Organizations: Not on file    Attends Archivist Meetings: Not on file    Marital Status: Married   No Known Allergies  Medications   (Not in a hospital admission)     Current Facility-Administered Medications:    sodium chloride flush (NS) 0.9 % injection 3 mL, 3 mL, Intravenous, Once, Teodoro Spray, PA  Current Outpatient Medications:    ALPRAZolam (XANAX) 1 MG tablet, Take 1 mg by mouth 3 (three) times daily as needed for anxiety., Disp: , Rfl:    amLODipine (NORVASC) 5 MG tablet, TAKE 1 TABLET BY MOUTH EVERY DAY, Disp: 90 tablet, Rfl: 3   hydrocortisone (CORTEF) 10 MG tablet, Take 10-15 mg by mouth See admin instructions. Take 1.5 tablets (15 mg) by mouth in the morning (1 hour after levothyroxine) & take 1 tablet (10 mg) by mouth in the afternoon at 2 pm., Disp: , Rfl:    levothyroxine (SYNTHROID) 88 MCG tablet, Take 88 mcg by mouth daily before breakfast., Disp: , Rfl:    oxyCODONE-acetaminophen (PERCOCET) 5-325 MG tablet, Take 1 tablet by mouth every 4 (four) hours as needed for severe pain., Disp: 30 tablet, Rfl: 0   rosuvastatin (CRESTOR) 20 MG tablet, Take 1 tablet (20 mg total) by mouth daily., Disp: 100 tablet, Rfl: 3   testosterone cypionate (DEPOTESTOSTERONE CYPIONATE) 200 MG/ML injection, Inject into the muscle every Monday. 0.2ms, Disp: , Rfl:    tiZANidine (ZANAFLEX) 4 MG tablet, Take 1 tablet (4 mg total) by mouth 3 (three) times daily., Disp: 30 tablet, Rfl: 0   verapamil (CALAN) 40 MG tablet, Take 1 tablet (40 mg total) by mouth 3 (three) times daily as needed., Disp: 90 tablet, Rfl: 1  Vitals   Vitals:   05/02/22 1833  BP: 132/74   Pulse: 98  Resp: 20  Temp: (!) 100.5 F (38.1 C)  TempSrc: Oral  SpO2: 93%     There is no height or weight on file to calculate BMI.  Physical Exam   Exam performed over telemedicine with 2-way video and audio communication and with assistance of bedside RN  Physical  Exam Gen: alert, unable to answer orientation questions 2/2 aphasia, does not follow any commands and does not mimic Resp: normal WOB CV: extremities appear well-perfused  Neuro: *MS: alert, unable to answer orientation questions 2/2 aphasia, does not follow any commands and does not mimic *Speech: nonverbal *CN: PERRL 40m, EOMI, UTA blink to threat 2/2 squeezing his eyes shut resisting the RN, face symmetric at rest, hearing intact to voice *Motor:   Normal bulk.  BUE drift to bed, slightly weaker on the R. Wiggles toes spontaneously on R, no movement on L. *Sensory: Decreased withdrawal on LLE compared to R *Coordination:  UTA *Reflexes:  UTA 2/2 tele-exam *Gait: deferred  NIHSS  1a Level of Conscious.: 0 1b LOC Questions: 2 1c LOC Commands: 2 2 Best Gaze: 0 3 Visual: 0 4 Facial Palsy: 0 5a Motor Arm - left: 2 5b Motor Arm - Right: 2 6a Motor Leg - Left: 4 6b Motor Leg - Right: 3 7 Limb Ataxia: 0 8 Sensory: 1 9 Best Language: 3 10 Dysarthria: 2 11 Extinct. and Inatten.: 0  TOTAL: 21   Premorbid mRS = 1   Labs   CBC: No results for input(s): "WBC", "NEUTROABS", "HGB", "HCT", "MCV", "PLT" in the last 168 hours.  Basic Metabolic Panel:  Lab Results  Component Value Date   NA 141 07/30/2021   K 4.1 07/30/2021   CO2 24 07/30/2021   GLUCOSE 103 (H) 07/30/2021   BUN 9 07/30/2021   CREATININE 1.05 07/30/2021   CALCIUM 8.8 (L) 07/30/2021   GFRNONAA >60 07/30/2021   GFRAA >60 11/06/2011   Lipid Panel:  Lab Results  Component Value Date   LDLCALC 107 (H) 07/14/2021   HgbA1c:  Lab Results  Component Value Date   HGBA1C 5.8 11/05/2011   Urine Drug Screen: No results found for:  "LABOPIA", "COCAINSCRNUR", "LABBENZ", "AMPHETMU", "THCU", "LABBARB"  Alcohol Level No results found for: "ETH"   Impression   This is a 61yo man with pmhx HL, HTN, OSA, SVT, thyroid disease, tobacco abuse, esophageal cancer (2018) who presents with new onset aphasia. LKW this AM, he woke up from a nap at 1500 unable to speak or follow commands. NIHSS = 21. CT head no acute process, ASPECTS 10. TNK not administered 2/2 patient outside the window. CTA showed no LVO and CT perfusion was negative. My suspicion is for infectious etiology (meningitis must be ruled out) vs metabolic (potentially related to his adrenal insufficiency.  Recommendations   - LP in ED w/ cell count in 2 tubes, glucose, protein, HSV PCR panel, culture. In the meantime start empiric coverage with vanc, ceftriaxone, ampicillin, acyclovir + dexamethasone - Workup for other metabolic/infectious etiologies per primary team - Patient should undergo MRI brain after initial workup has been completed. If no e/o stroke, not further stroke workup indicated. This can be done in ED or after admission. MRI brain should be with and without contrast given history of malignancy. - rEEG when able to obtain (Tues). Given absence of seizure history or clinical seizure activity there is no indication to transfer for EEG at this time. - Neurology will continue to follow ______________________________________________________________________   Thank you for the opportunity to take part in the care of this patient. If you have any further questions, please contact the neurology consultation attending.  Signed,  CSu Monks MD [facility] [phone] If 7pm- 7am, please page neurology on call as listed in [place]  **Any copied and pasted documentation in this note was written  by me in another application not billed for and pasted by me into this document.

## 2022-05-02 NOTE — Sepsis Progress Note (Signed)
Elink following for Sepsis Protocol 

## 2022-05-02 NOTE — Progress Notes (Signed)
   05/02/22 1900  Clinical Encounter Type  Visited With Family  Visit Type Initial;Code  Referral From Nurse  Consult/Referral To [place] responded to Code Stroke and assisted spouse of patient to room and provided emotional support.

## 2022-05-02 NOTE — ED Provider Notes (Signed)
   [facility] Provider Note    Event Date/Time   First MD Initiated Contact with Patient 05/02/22 1855     (approximate)   History   Chief Complaint Fever and Altered Mental Status   HPI Jose Hayes is a 61 y.o. male presenting with fever, altered mental status, aphasia, and weakness.  Per request by Dr. Jari Pigg, assisted with diagnostic lumbar puncture on this patient.  Ultimately unsuccessful in obtaining sample, though patient tolerated the procedure well.  No complications noted.  See below for details.    .Lumbar Puncture  Date/Time: 05/02/2022 10:50 PM  Performed by: Teodoro Spray, PA Authorized by: Teodoro Spray, PA   Consent:    Consent obtained:  Verbal   Consent given by:  Patient and spouse   Risks, benefits, and alternatives were discussed: yes     Risks discussed:  Bleeding, infection, pain, repeat procedure, nerve damage and headache   Alternatives discussed:  No treatment Universal protocol:    Patient identity confirmed:  Verbally with patient and arm band Pre-procedure details:    Procedure purpose:  Diagnostic   Preparation: Patient was prepped and draped in usual sterile fashion   Anesthesia:    Anesthesia method:  Local infiltration   Local anesthetic:  Lidocaine 1% w/o epi Procedure details:    Lumbar space:  L4-L5 interspace   Patient position:  L lateral decubitus   Needle gauge:  20   Needle type:  Diamond point   Needle length (in):  3.5   Ultrasound guidance: no     Number of attempts:  2   Fluid appearance:  Bloody   Total volume (ml):  0 Post-procedure details:    Puncture site:  Adhesive bandage applied   Procedure completion:  Tolerated well, no immediate complications     Teodoro Spray, PA 05/02/22 2254    Vanessa [place], MD 05/03/22 1550

## 2022-05-02 NOTE — ED Triage Notes (Signed)
Pt to ED with wifem, PA at bedside states pt is weak to L side and aphasic since 1500 today. Pt also has fever and flulike symptoms since this AM Hx adrenal insufficiency  Pt is not following commands or speaking at this time. Motor strength is same to both arms, L hand grip slightly weaker. Not following examiner's finger with eyes at all from midline  CBG 103, [id number]

## 2022-05-02 NOTE — ED Notes (Signed)
CODE  STROKE  CALLED  TO  CARELINK  

## 2022-05-02 NOTE — ED Provider Notes (Signed)
[facility] Provider Note    Event Date/Time   First MD Initiated Contact with Patient 05/02/22 1855     (approximate)   History   Fever and Altered Mental Status   HPI  Jose Hayes is a 61 y.o. male who comes in with fevers, weakness, altered mental status.  Patient has been feeling unwell for the past week and has had some positive contacts of the flu however around 1500 patient was not as responsive to questions and difficulty articulating words.  Patient reportedly had a little bit of hand weakness on the left as well as difficulty speaking.  However there is further clarification with the wife who reports last known normal was actually yesterday.  They have been around a lot of people with the flu.  He has had some occasional abdominal discomfort.  Physical Exam   Triage Vital Signs: ED Triage Vitals [05/02/22 1833]  Enc Vitals Group     BP 132/74     Pulse Rate 98     Resp 20     Temp (!) 100.5 F (38.1 C)     Temp Source Oral     SpO2 93 %     Weight      Height      Head Circumference      Peak Flow      Pain Score      Pain Loc      Pain Edu?      Excl. in [place]?     Most recent vital signs: Vitals:   05/02/22 1833  BP: 132/74  Pulse: 98  Resp: 20  Temp: (!) 100.5 F (38.1 C)  SpO2: 93%     General: Awake, no distress.  CV:  Good peripheral perfusion.  Resp:  Normal effort.  Abd:  No distention.  Other:  Patient is altered.  Patient is nonverbal.  Patient is looking at you   ED Results / Procedures / Treatments   Labs (all labs ordered are listed, but only abnormal results are displayed) Labs Reviewed  CBG MONITORING, ED - Abnormal; Notable for the following components:      Result Value   Glucose-Capillary 103 (*)    All other components within normal limits  RESP PANEL BY RT-PCR (RSV, FLU A&B, COVID)  [id number]  PROTIME-INR  APTT  CBC  DIFFERENTIAL  COMPREHENSIVE METABOLIC PANEL  ETHANOL  I-STAT  CREATININE, ED  CBG MONITORING, ED     EKG  My interpretation of EKG:  Sinus rhythm 90 without any ST elevation or T wave inversions, normal intervals  RADIOLOGY I have reviewed the CT had personally and no evidence of intracranial hemorrhage  PROCEDURES:  Critical Care performed: Yes, see critical care procedure note(s)  .1-3 Lead EKG Interpretation  Performed by: Vanessa [place], MD Authorized by: Vanessa [place], MD     Interpretation: abnormal     ECG rate:  90   ECG rate assessment: normal     Rhythm: sinus rhythm     Ectopy: none     Conduction: normal   .Critical Care  Performed by: Vanessa [place], MD Authorized by: Vanessa [place], MD   Critical care provider statement:    Critical care time (minutes):  30   Critical care was necessary to treat or prevent imminent or life-threatening deterioration of the following conditions:  Sepsis   Critical care was time spent personally by me on the following activities:  Development of treatment  plan with patient or surrogate, discussions with consultants, evaluation of patient's response to treatment, examination of patient, ordering and review of laboratory studies, ordering and review of radiographic studies, ordering and performing treatments and interventions, pulse oximetry, re-evaluation of patient's condition and review of old charts    [place] ED: Medications  sodium chloride flush (NS) 0.9 % injection 3 mL (has no administration in time range)     IMPRESSION / MDM / [place] / ED COURSE  I reviewed the triage vital signs and the nursing notes.   Patient's presentation is most consistent with acute presentation with potential threat to life or bodily function.   Patient comes in altered febrile just got Tylenol at 3 we will give some rectal Tylenol after 6 hours around 9:00.  Stroke code called from triage due to aphasic and some weakness.  Patient out of the window for TNK.  Discussed with  Dr. Quinn Axe from neurology CTA was negative.  No signs of active seizing but patient remains altered and febrile.  Recommended LP.  LP was attempted x 2.  Patient was started on broad-spectrum antibiotics to cover for meningitis IR consult placed for LP tomorrow.  Labs ordered to evaluate for any other cause and will get pan CT given altered unable to get a great history will do without contrast given he already got contrast from the CTA and patient has some elevated creatinine.  Ethanol negative lactate normal CBC normal white count creatinine elevated at 1.45 COVID, flu are negative.  Patient will need to be admitted to the hospital after CT scan  Given patient's adrenal insufficiency I discussed with pharmacist Nilsa Nutting- recommend   Patient was on '15mg'$  in the morning and '10mg'$  in the afternoon, since the IV concentration we have is '50mg'$ /mL (9m vial), I think the best bet might be to consolidate into hydrocortisone IV '25mg'$  daily   Patient does not have signs of adrenal crisis at this time so we will hold off on the 100 and start with 25 but if he does develop hypotension could consider additional doses.  His CT pan scan without any other evidence of infection and I did discuss the hospital team for admission.  The patient is on the cardiac monitor to evaluate for evidence of arrhythmia and/or significant heart rate changes.      FINAL CLINICAL IMPRESSION(S) / ED DIAGNOSES   Final diagnoses:  Altered mental status, unspecified altered mental status type  Sepsis, due to unspecified organism, unspecified whether acute organ dysfunction present ([facility]     Rx / DC Orders   ED Discharge Orders     None        Note:  This document was prepared using Dragon voice recognition software and may include unintentional dictation errors.   FVanessa [place] MD 05/02/22 [phone]

## 2022-05-02 NOTE — Consult Note (Signed)
PHARMACY -  BRIEF ANTIBIOTIC NOTE   Pharmacy has received consult(s) for ampicillin, vancomycin, acyclovir from an ED provider.  The patient's profile has been reviewed for ht/wt/allergies/indication/available labs.    One time order(s) placed for 2g ampicillin IV x1, vancomycin '2500mg'$  IV x1, acyclovir '860mg'$  IV x1 ('10mg'$ /kg based on adj bw, given BMI >30)   Further antibiotics/pharmacy consults should be ordered by admitting physician if indicated.                       Thank you, Darrick Penna 05/02/2022  7:57 PM

## 2022-05-02 NOTE — H&P (Addendum)
History and Physical    Patient: Jose Hayes BTD:[id number] DOB: 03-31-1961 DOA: 05/02/2022 DOS: the patient was seen and examined on 05/02/2022 PCP: Leone Haven, MD  Patient coming from: Home  Chief Complaint:  Chief Complaint  Patient presents with   Fever   Altered Mental Status   HPI: Jose Hayes is a 61 y.o. male with medical history significant of ***  Review of Systems: {ROS_Text:26778} Past Medical History:  Diagnosis Date   Allergy    Anxiety    COVID-19    1/14, 05/24/20   Esophageal cancer ([place]) 10/21/2016   GE junction   GERD (gastroesophageal reflux disease)    Hyperglycemia    Hyperlipidemia    Hypertension    Sleep apnea    CPAP   SVT (supraventricular tachycardia) ([place])    2013   Thyroid disease    Tobacco abuse    Past Surgical History:  Procedure Laterality Date   COLONOSCOPY     KNEE ARTHROSCOPY Right 07/30/2021   Procedure: right knee arthroscopy, debridement, removal loose body;  Surgeon: Meredith Pel, MD;  Location: [place];  Service: Orthopedics;  Laterality: Right;   LAPAROSCOPIC CHOLECYSTECTOMY  1991   SHOULDER SURGERY Right 1985, 1991, 2000   left x3   [place] teeth extraction     UPPER GASTROINTESTINAL ENDOSCOPY     Social History:  reports that he has quit smoking. His smoking use included cigarettes. He has a 12.50 pack-year smoking history. He has never used smokeless tobacco. He reports that he does not currently use alcohol. He reports that he does not use drugs.  No Known Allergies  Family History  Problem Relation Age of Onset   Hypertension Mother    Hypertension Father    Pancreatic cancer Father    Clotting disorder Father        renal cell ca   Colon cancer Neg Hx    Esophageal cancer Neg Hx    Prostate cancer Neg Hx    Rectal cancer Neg Hx    Stomach cancer Neg Hx    Colon polyps Neg Hx     Prior to Admission medications   Medication Sig Start Date End Date Taking? Authorizing Provider   ALPRAZolam Duanne Moron) 1 MG tablet Take 1 mg by mouth 3 (three) times daily as needed for anxiety.    [provider]  amLODipine (NORVASC) 5 MG tablet TAKE 1 TABLET BY MOUTH EVERY DAY 07/31/21   Dunn, Areta Haber, PA-C  hydrocortisone (CORTEF) 10 MG tablet Take 10-15 mg by mouth See admin instructions. Take 1.5 tablets (15 mg) by mouth in the morning (1 hour after levothyroxine) & take 1 tablet (10 mg) by mouth in the afternoon at 2 pm.    [provider]  levothyroxine (SYNTHROID) 88 MCG tablet Take 88 mcg by mouth daily before breakfast.    [provider]  oxyCODONE-acetaminophen (PERCOCET) 5-325 MG tablet Take 1 tablet by mouth every 4 (four) hours as needed for severe pain. 08/04/21 08/04/22  Magnant, Charles L, PA-C  rosuvastatin (CRESTOR) 20 MG tablet Take 1 tablet (20 mg total) by mouth daily. 07/29/21   Leone Haven, MD  testosterone cypionate (DEPOTESTOSTERONE CYPIONATE) 200 MG/ML injection Inject into the muscle every Monday. 0.62ms 05/13/21   [provider]  tiZANidine (ZANAFLEX) 4 MG tablet Take 1 tablet (4 mg total) by mouth 3 (three) times daily. 07/30/21 07/30/22  Magnant, Charles L, PA-C  verapamil (CALAN) 40 MG tablet Take 1 tablet (40  mg total) by mouth 3 (three) times daily as needed. 06/10/20   Minna Merritts, MD    Physical Exam: Vitals:   05/02/22 1930 05/02/22 2200 05/02/22 2245 05/02/22 2251  BP: 136/69 (!) 115/58 (!) 103/56   Pulse: 95 89 94   Resp: 15 (!) 31 (!) 32   Temp:    (!) 102.3 F (39.1 C)  TempSrc:    Oral  SpO2: 97% 95% 94%   Weight:       *** Data Reviewed: {Tip this will not be part of the note when signed- Document your independent interpretation of telemetry tracing, EKG, lab, Radiology test or any other diagnostic tests. Add any new diagnostic test ordered today. (Optional):26781} {Results:26384}  Assessment and Plan: No notes have been filed under this hospital service. Service: Hospitalist     Advance Care  Planning:   Code Status: Not on file ***  Consults: ***  Family Communication: ***  Severity of Illness: {Observation/Inpatient:21159}  Author: Jani Gravel, MD 05/02/2022 11:57 PM  For on call review [email or web address].

## 2022-05-02 NOTE — ED Provider Triage Note (Signed)
  Emergency Medicine Provider Triage Evaluation Note  Jose Hayes , a 61 y.o.male,  was evaluated in triage.  Pt complains of fever/weakness/altered mental status.  Patient is joined by his wife, who states that patient has been feeling unwell for the past week, reportedly due to influenza infection has been going around the house.  However, around approximately 1500 today, patient has not been responsive to questions.  He has been unable to articulate words.  She states that he is also been unable to tie his shoes and perform other daily activities.  She states that the patient also has a history of adrenal insufficiency, however recently received a dose of hydrocortisone.   Review of Systems  Positive: Weakness, AMS, aphasia. Negative: Denies abdominal pain, chest pain, vomiting  Physical Exam   Vitals:   05/02/22 1833  BP: 132/74  Pulse: 98  Resp: 20  Temp: (!) 100.5 F (38.1 C)  SpO2: 93%   Gen:   Awake, appears anxious. Resp:  Normal effort  MSK:   Moves extremities without difficulty  Other:  Patient is attentive, but does not appear to be able to articulate words.  Unable to follow commands.  Mild grip strength weakness in the left upper extremity.  No weakness in the lower extremities.  Medical Decision Making  Given the patient's initial medical screening exam, the following diagnostic evaluation has been ordered. The patient will be placed in the appropriate treatment space, once one is available, to complete the evaluation and treatment. I have discussed the plan of care with the patient and I have advised the patient that an ED physician or mid-level practitioner will reevaluate their condition after the test results have been received, as the results may give them additional insight into the type of treatment they may need.    Diagnostics: Labs, head CT, EKG, respiratory panel. (Code stroke)  Treatments: none immediately   Teodoro Spray, [place] 05/02/22  1848

## 2022-05-02 NOTE — Progress Notes (Signed)
CODE STROKE Elert @ [phone] LKWT @ this morning per wife at bedside Quinn Axe called in at @ [address] Quinn Axe on screen @ [phone]

## 2022-05-03 ENCOUNTER — Encounter: Payer: Self-pay | Admitting: Radiology

## 2022-05-03 ENCOUNTER — Inpatient Hospital Stay: Payer: Medicare HMO

## 2022-05-03 ENCOUNTER — Other Ambulatory Visit: Payer: Self-pay

## 2022-05-03 DIAGNOSIS — R404 Transient alteration of awareness: Secondary | ICD-10-CM

## 2022-05-03 LAB — URINALYSIS, COMPLETE (UACMP) WITH MICROSCOPIC
Bacteria, UA: NONE SEEN
Bilirubin Urine: NEGATIVE
Glucose, UA: NEGATIVE mg/dL
Ketones, ur: 5 mg/dL — AB
Leukocytes,Ua: NEGATIVE
Nitrite: NEGATIVE
Protein, ur: NEGATIVE mg/dL
Specific Gravity, Urine: >1.046 — ABNORMAL HIGH (ref 1.005–1.030)
Squamous Epithelial / HPF: NONE SEEN (ref 0–5)
pH: 5 (ref 5.0–8.0)

## 2022-05-03 LAB — COMPREHENSIVE METABOLIC PANEL
ALT: 30 U/L (ref 0–44)
AST: 42 U/L — ABNORMAL HIGH (ref 15–41)
Albumin: 4.1 g/dL (ref 3.5–5.0)
Alkaline Phosphatase: 41 U/L (ref 38–126)
Anion gap: 11 (ref 5–15)
BUN: 23 mg/dL (ref 8–23)
CO2: 21 mmol/L — ABNORMAL LOW (ref 22–32)
Calcium: 8.1 mg/dL — ABNORMAL LOW (ref 8.9–10.3)
Chloride: 104 mmol/L (ref 98–111)
Creatinine, Ser: 1.6 mg/dL — ABNORMAL HIGH (ref 0.61–1.24)
GFR, Estimated: 49 mL/min — ABNORMAL LOW (ref >60)
Glucose, Bld: 155 mg/dL — ABNORMAL HIGH (ref 70–99)
Potassium: 4.2 mmol/L (ref 3.5–5.1)
Sodium: 136 mmol/L (ref 135–145)
Total Bilirubin: 1.2 mg/dL (ref 0.3–1.2)
Total Protein: 6.9 g/dL (ref 6.5–8.1)

## 2022-05-03 LAB — BLOOD GAS, ARTERIAL
Acid-base deficit: 2.1 mmol/L — ABNORMAL HIGH (ref 0.0–2.0)
Bicarbonate: 20.9 mmol/L (ref 20.0–28.0)
FIO2: 21 %
O2 Saturation: 100 %
Patient temperature: 37
pCO2 arterial: 30 mmHg — ABNORMAL LOW (ref 32–48)
pH, Arterial: 7.45 (ref 7.35–7.45)
pO2, Arterial: 124 mmHg — ABNORMAL HIGH (ref 83–108)

## 2022-05-03 LAB — CBC
HCT: 39.1 % (ref 39.0–52.0)
Hemoglobin: 12.5 g/dL — ABNORMAL LOW (ref 13.0–17.0)
MCH: 28.7 pg (ref 26.0–34.0)
MCHC: 32 g/dL (ref 30.0–36.0)
MCV: 89.7 fL (ref 80.0–100.0)
Platelets: 127 10*3/uL — ABNORMAL LOW (ref 150–400)
RBC: 4.36 MIL/uL (ref 4.22–5.81)
RDW: 14 % (ref 11.5–15.5)
WBC: 4.8 10*3/uL (ref 4.0–10.5)
nRBC: 0 % (ref 0.0–0.2)

## 2022-05-03 LAB — TSH: TSH: 0.664 u[IU]/mL (ref 0.350–4.500)

## 2022-05-03 LAB — TROPONIN I (HIGH SENSITIVITY): Troponin I (High Sensitivity): 12 ng/L (ref <18)

## 2022-05-03 LAB — SEDIMENTATION RATE: Sed Rate: 13 mm/hr (ref 0–20)

## 2022-05-03 LAB — HIV ANTIBODY (ROUTINE TESTING W REFLEX)
HIV Screen 4th Generation wRfx: NONREACTIVE
HIV Screen 4th Generation wRfx: NONREACTIVE

## 2022-05-03 LAB — LACTIC ACID, PLASMA: Lactic Acid, Venous: 1.5 mmol/L (ref 0.5–1.9)

## 2022-05-03 LAB — PROCALCITONIN: Procalcitonin: <0.1 ng/mL

## 2022-05-03 MED ORDER — ENOXAPARIN SODIUM 60 MG/0.6ML IJ SOSY
0.5000 mg/kg | PREFILLED_SYRINGE | INTRAMUSCULAR | Status: DC
  Filled 2022-05-03 (×2): qty 0.6

## 2022-05-03 MED ORDER — SODIUM CHLORIDE 0.9 % IV SOLN: INTRAVENOUS | Status: DC

## 2022-05-03 MED ORDER — LEVOTHYROXINE SODIUM 88 MCG PO TABS
88.0000 ug | ORAL_TABLET | Freq: Every day | ORAL | Status: DC
  Administered 2022-05-03 – 2022-05-04 (×2): 88 ug via ORAL
  Filled 2022-05-03 (×3): qty 1

## 2022-05-03 MED ORDER — VANCOMYCIN HCL 750 MG/150ML IV SOLN
750.0000 mg | Freq: Two times a day (BID) | INTRAVENOUS | Status: DC
  Filled 2022-05-03: qty 150

## 2022-05-03 MED ORDER — ACETAMINOPHEN 650 MG RE SUPP: 650.0000 mg | Freq: Four times a day (QID) | RECTAL | Status: DC | PRN

## 2022-05-03 MED ORDER — ACETAMINOPHEN 325 MG PO TABS
650.0000 mg | ORAL_TABLET | Freq: Four times a day (QID) | ORAL | Status: DC | PRN
  Administered 2022-05-03: 650 mg via ORAL
  Filled 2022-05-03: qty 2

## 2022-05-03 MED ORDER — DEXTROSE 5 % IV SOLN
10.0000 mg/kg | Freq: Three times a day (TID) | INTRAVENOUS | Status: DC
  Filled 2022-05-03 (×2): qty 13.7

## 2022-05-03 MED ORDER — ONDANSETRON HCL 4 MG/2ML IJ SOLN: 4.0000 mg | Freq: Four times a day (QID) | INTRAMUSCULAR | Status: DC | PRN

## 2022-05-03 MED ORDER — DEXTROSE 5 % IV SOLN
10.0000 mg/kg | Freq: Three times a day (TID) | INTRAVENOUS | Status: DC
  Administered 2022-05-03: 685 mg via INTRAVENOUS
  Filled 2022-05-03 (×2): qty 13.7

## 2022-05-03 MED ORDER — ONDANSETRON HCL 4 MG PO TABS: 4.0000 mg | ORAL_TABLET | Freq: Four times a day (QID) | ORAL | Status: DC | PRN

## 2022-05-03 MED ORDER — SODIUM CHLORIDE 0.9 % IV SOLN: INTRAVENOUS | Status: AC

## 2022-05-03 MED ORDER — ALPRAZOLAM 0.5 MG PO TABS
1.0000 mg | ORAL_TABLET | Freq: Three times a day (TID) | ORAL | Status: DC | PRN
  Administered 2022-05-03: 1 mg via ORAL
  Filled 2022-05-03: qty 2

## 2022-05-03 MED ORDER — HYDROCORTISONE SOD SUC (PF) 100 MG IJ SOLR
25.0000 mg | Freq: Three times a day (TID) | INTRAMUSCULAR | Status: DC
  Administered 2022-05-03 – 2022-05-04 (×3): 25 mg via INTRAVENOUS
  Filled 2022-05-03 (×4): qty 0.5

## 2022-05-03 MED ORDER — AMLODIPINE BESYLATE 5 MG PO TABS
5.0000 mg | ORAL_TABLET | Freq: Every day | ORAL | Status: DC
  Administered 2022-05-03: 5 mg via ORAL
  Filled 2022-05-03: qty 1

## 2022-05-03 MED ORDER — SODIUM CHLORIDE 0.9 % IV SOLN
2.0000 g | INTRAVENOUS | Status: DC
  Administered 2022-05-03: 2 g via INTRAVENOUS
  Filled 2022-05-03 (×4): qty 2000

## 2022-05-03 MED ORDER — ROSUVASTATIN CALCIUM 20 MG PO TABS
20.0000 mg | ORAL_TABLET | Freq: Every day | ORAL | Status: DC
  Administered 2022-05-04: 20 mg via ORAL
  Filled 2022-05-03 (×2): qty 1

## 2022-05-03 MED ORDER — DULOXETINE HCL 30 MG PO CPEP
30.0000 mg | ORAL_CAPSULE | Freq: Two times a day (BID) | ORAL | Status: DC
  Administered 2022-05-03 – 2022-05-04 (×4): 30 mg via ORAL
  Filled 2022-05-03 (×7): qty 1

## 2022-05-03 NOTE — ED Notes (Signed)
Pt ambulated to bathroom without assistance. Pt asked for help getting back in bed, so help was provided. Pt is now hooked back up to the cardiac monitor and has been repositioned in bed. Pts fully consumed meal tray was disposed of, per pts request. Pt denied needing anything else at this time and was instructed to use call bell in case he needed any other assistance. Pt responded with "Thank you."

## 2022-05-03 NOTE — ED Notes (Signed)
Patient states he is feeling flushed and a little anxious. Patient requesting tylenol and something to help with his nerves. Prn medications given. Patient tolerated well

## 2022-05-03 NOTE — ED Notes (Signed)
Patient to MRI.

## 2022-05-03 NOTE — Progress Notes (Signed)
Pharmacy Antibiotic Note  Jose Hayes is a 61 y.o. male admitted on 05/02/2022 with meningitis.  Pharmacy has been consulted for Acyclovir, Vancomycin dosing.  Plan: Acyclovir 860 mg IV X 1 given in ED on 12/24 @ 2157. Acyclovir 685 mg (10 mg/kg IBW) IV Q8H ordered to start on 12/25 @ 0600.   Vancomycin 2 gm IV X 1 given in ED on 12/24 @ 2247 followed by additional Vanc 500 mg 12/25 @ 0059 to make total loading dose of 2500 mg.  Vancomycin 750 mg IV Q12H ordered to start on 12/25 @ 1100.   - Will not use AUC dosing b/c meningitis.   Weight: 112.7 kg (248 lb 7.3 oz)  Temp (24hrs), Avg:101.4 F (38.6 C), Min:100.5 F (38.1 C), Max:102.3 F (39.1 C)  Recent Labs  Lab 05/02/22 1901 05/02/22 1954  WBC 5.7  --   CREATININE 1.45*  --   LATICACIDVEN  --  1.5    Estimated Creatinine Clearance: 65.2 mL/min (A) (by C-G formula based on SCr of 1.45 mg/dL (H)).    No Known Allergies  Antimicrobials this admission:   >>    >>   Dose adjustments this admission:   Microbiology results:  BCx:   UCx:   Sputum:    MRSA PCR:   Thank you for allowing pharmacy to be a part of this patient's care.  Janai Brannigan D 05/03/2022 2:00 AM

## 2022-05-03 NOTE — Progress Notes (Signed)
  PROGRESS NOTE    Jose Hayes  STM:[id number] DOB: [birth year] DOA: 05/02/2022 PCP: Leone Haven, MD  ED34A/ED34A  LOS: 1 day   Brief hospital course:   Assessment & Plan: Jose Hayes is a 61 y.o. male with medical history significant of  hypertension, hyperlipidemia, hx of SVT, Gerd, Anxiety, OSA on CPAP,  hx of esophageal cancer, Hx of L acoustic neuroma, hypothyroidism, adrenal insufficiency, apparently c/o slight dyspnea and altered mental status starting in am.  Pt notes that his wife has been sick.    Acute metabolic encephalopathy  --likely due to adrenal crisis in the setting of acute illness.  Pt and family said pt was supposed to take his stress dose steroid at home but didn't. --MRI neg for acute stroke.  LP attempted in the ED to rule out meningitis, however, was not successful in obtaining sample.  Doubt meningitis, since pt denied neck pain or nuchal indignity PTA or currently.  Mental status back to normal the next day after receiving stress dose steroid. --hold further abx  Likely URI --pt presented with fever, cough and congestion, with sick family contact.  CT c/a/p without acute finding.  Likely URI. --obtain RVP --hold further abx  Possible adrenal crisis Adrenal insufficiency --cont IV solu-cortef   Hypothyroidism Cont Levothyroxine 88 micrograms po qday   Hypertension --hold amlodipine due to soft BP   Anxiety Cont Xanax '1mg'$  po tid prn  Cont Duloxetine '30mg'$  po bid   OSA on home CPAP --ABG without hypercapnia  --Cpap per home setting   DVT prophylaxis: Lovenox SQ Code Status: Full code  Family Communication: wife updated at the bedside today Level of care: Stepdown Dispo:   The patient is from: home Anticipated d/c is to: home Anticipated d/c date is: tomorrow   Subjective and Interval History:  Pt was completely alert, oriented and coherent.  Reported coughing and congestion.  No neck pain or nuchal rigidity PTA or  currently.   Objective: Vitals:   05/03/22 1330 05/03/22 1400 05/03/22 1515 05/03/22 1600  BP: 109/64 103/63 119/67 113/65  Pulse: 76 77 68 73  Resp: '18 17 16 14  '$ Temp:    98 F (36.7 C)  TempSrc:      SpO2: 91% 91% 93% 92%  Weight:      Height:        Intake/Output Summary (Last 24 hours) at 05/03/2022 1739 Last data filed at 05/03/2022 0837 Gross per 24 hour  Intake 100 ml  Output --  Net 100 ml   Filed Weights   05/02/22 1901 05/03/22 1137  Weight: 112.7 kg 112.7 kg    Examination:   Constitutional: NAD, AAOx3 HEENT: conjunctivae and lids normal, EOMI CV: No cyanosis.   RESP: normal respiratory effort, on RA Neuro: II - XII grossly intact.   Psych: Normal mood and affect.  Appropriate judgement and reason   Data Reviewed: I have personally reviewed labs and imaging studies  Time spent: 50 minutes  Enzo Bi, MD [facility] If 7PM-7AM, please contact night-coverage 05/03/2022, 5:39 PM

## 2022-05-03 NOTE — Progress Notes (Signed)
Anticoagulation monitoring(Lovenox):  61 yo male ordered Lovenox 40 mg Q24h    Filed Weights   05/02/22 1901  Weight: 112.7 kg (248 lb 7.3 oz)   BMI 38   Lab Results  Component Value Date   CREATININE 1.45 (H) 05/02/2022   CREATININE 1.05 07/30/2021   CREATININE 1.05 06/22/2021   Estimated Creatinine Clearance: 65.2 mL/min (A) (by C-G formula based on SCr of 1.45 mg/dL (H)). Hemoglobin & Hematocrit     Component Value Date/Time   HGB 14.9 05/02/2022 1901   HGB 15.6 11/05/2011 1051   HCT 45.4 05/02/2022 1901   HCT 46.4 11/05/2011 1051     Per Protocol for Patient with estCrcl > 30 ml/min and BMI > 30, will transition to Lovenox 57.5 mg Q24h.

## 2022-05-03 NOTE — Progress Notes (Signed)
   05/03/22 0800  Clinical Encounter Type  Visited With Patient and family together  Visit Type Follow-up   Chaplain provided follow up care to Code Stroke patient and spouse.

## 2022-05-03 NOTE — ED Notes (Signed)
Patient returned from MRI.

## 2022-05-04 DIAGNOSIS — R4182 Altered mental status, unspecified: Secondary | ICD-10-CM | POA: Diagnosis not present

## 2022-05-04 DIAGNOSIS — E272 Addisonian crisis: Secondary | ICD-10-CM

## 2022-05-04 DIAGNOSIS — R404 Transient alteration of awareness: Secondary | ICD-10-CM | POA: Diagnosis not present

## 2022-05-04 LAB — BASIC METABOLIC PANEL
Anion gap: 9 (ref 5–15)
BUN: 25 mg/dL — ABNORMAL HIGH (ref 8–23)
CO2: 25 mmol/L (ref 22–32)
Calcium: 8.5 mg/dL — ABNORMAL LOW (ref 8.9–10.3)
Chloride: 108 mmol/L (ref 98–111)
Creatinine, Ser: 1.1 mg/dL (ref 0.61–1.24)
GFR, Estimated: >60 mL/min (ref >60)
Glucose, Bld: 123 mg/dL — ABNORMAL HIGH (ref 70–99)
Potassium: 4 mmol/L (ref 3.5–5.1)
Sodium: 142 mmol/L (ref 135–145)

## 2022-05-04 LAB — CBC
HCT: 43.1 % (ref 39.0–52.0)
Hemoglobin: 14 g/dL (ref 13.0–17.0)
MCH: 28.3 pg (ref 26.0–34.0)
MCHC: 32.5 g/dL (ref 30.0–36.0)
MCV: 87.2 fL (ref 80.0–100.0)
Platelets: 162 10*3/uL (ref 150–400)
RBC: 4.94 MIL/uL (ref 4.22–5.81)
RDW: 14.2 % (ref 11.5–15.5)
WBC: 10.9 10*3/uL — ABNORMAL HIGH (ref 4.0–10.5)
nRBC: 0 % (ref 0.0–0.2)

## 2022-05-04 LAB — RESPIRATORY PANEL BY PCR

## 2022-05-04 LAB — MAGNESIUM: Magnesium: 2.8 mg/dL — ABNORMAL HIGH (ref 1.7–2.4)

## 2022-05-04 LAB — RPR: RPR Ser Ql: NONREACTIVE

## 2022-05-04 MED ORDER — AMLODIPINE BESYLATE 5 MG PO TABS: ORAL_TABLET | ORAL | 3 refills | Status: DC

## 2022-05-04 MED ORDER — HYDROCORTISONE SOD SUC (PF) 100 MG IJ SOLR
25.0000 mg | Freq: Two times a day (BID) | INTRAMUSCULAR | Status: DC
  Administered 2022-05-04: 25 mg via INTRAVENOUS
  Filled 2022-05-04 (×2): qty 0.5

## 2022-05-04 MED ORDER — HYDROCORTISONE SOD SUC (PF) 100 MG IJ SOLR: 100.0000 mg | INTRAMUSCULAR | 1 refills | Status: AC | PRN

## 2022-05-04 NOTE — Progress Notes (Signed)
Eeg done 

## 2022-05-04 NOTE — Progress Notes (Signed)
Subjective: Feels back to baseline.   Objective: Current vital signs: BP (!) 110/54 (BP Location: Left Arm)   Pulse (!) 58   Temp 98.2 F (36.8 C) (Oral)   Resp 20   Ht '5\' 8"'$  (1.727 m)   Wt 112.7 kg   SpO2 96%   BMI 37.78 kg/m  Vital signs in last 24 hours: Temp:  [97.7 F (36.5 C)-98.2 F (36.8 C)] 98.2 F (36.8 C) (12/26 1312) Pulse Rate:  [52-79] 58 (12/26 1312) Resp:  [15-20] 20 (12/26 1312) BP: (90-120)/(50-68) 110/54 (12/26 1312) SpO2:  [89 %-97 %] 96 % (12/26 1312)  Intake/Output from previous day: 12/25 0701 - 12/26 0700 In: 100 [IV Piggyback:100] Out: -  Intake/Output this shift: No intake/output data recorded. Nutritional status:  Diet Order             Diet heart healthy/carb modified Room service appropriate? Yes; Fluid consistency: Thin  Diet effective now                  HEENT: [place]/AT Lungs: Respirations unlabored Ext: No edema  Neurologic Exam: Mental Status: Awake and alert. Fully oriented. Thought content appropriate.  Speech fluent without evidence of aphasia.  Able to follow all commands without difficulty. Cranial Nerves: II: Fixates and tracks normally. Pupils are equal.  III,IV, VI: No ptosis. EOMI.  VII: Smile symmetric VIII: Hearing intact to voice IX,X: No hypophonia or hoarseness. Occasional mild vocal tremor noted.  XI: Symmetric. Occasional neck tremor noted.  XII: No lingual dysarthria.  Motor: RUE: 5/5 RLE: 5/5 LUE: 5/5 LLE: 5/5 Occasional action tremor noted bilaterally.  Sensory: Grossly intact to touch x 4 during motor exam.  Cerebellar: No ataxia with FNF bilaterally Gait: Deferred   Lab Results: Results for orders placed or performed during the hospital encounter of 05/02/22 (from the past 48 hour(s))  CBG monitoring, ED     Status: Abnormal   Collection Time: 05/02/22  6:41 PM  Result Value Ref Range   Glucose-Capillary 103 (H) 70 - 99 mg/dL    Comment: Glucose reference range applies only to samples taken  after fasting for at least 8 hours.  Protime-INR     Status: None   Collection Time: 05/02/22  7:01 PM  Result Value Ref Range   Prothrombin Time 14.6 11.4 - 15.2 seconds   INR 1.2 0.8 - 1.2    Comment: (NOTE) INR goal varies based on device and disease states. Performed at [facility], [address] [place], [place] [address]   APTT     Status: None   Collection Time: 05/02/22  7:01 PM  Result Value Ref Range   aPTT 34 24 - 36 seconds    Comment: Performed at [facility], [address] [place], [place] [address]  CBC     Status: None   Collection Time: 05/02/22  7:01 PM  Result Value Ref Range   WBC 5.7 4.0 - 10.5 K/uL   RBC 5.22 4.22 - 5.81 MIL/uL   Hemoglobin 14.9 13.0 - 17.0 g/dL   HCT 45.4 39.0 - 52.0 %   MCV 87.0 80.0 - 100.0 fL   MCH 28.5 26.0 - 34.0 pg   MCHC 32.8 30.0 - 36.0 g/dL   RDW 14.0 11.5 - 15.5 %   Platelets 162 150 - 400 K/uL   nRBC 0.0 0.0 - 0.2 %    Comment: Performed at [facility], [address], [place], [place] [address]  Differential  Status: Abnormal   Collection Time: 05/02/22  7:01 PM  Result Value Ref Range   Neutrophils Relative % 63 %   Neutro Abs 3.6 1.7 - 7.7 K/uL   Lymphocytes Relative 17 %   Lymphs Abs 0.9 0.7 - 4.0 K/uL   Monocytes Relative 19 %   Monocytes Absolute 1.1 (H) 0.1 - 1.0 K/uL   Eosinophils Relative 0 %   Eosinophils Absolute 0.0 0.0 - 0.5 K/uL   Basophils Relative 1 %   Basophils Absolute 0.0 0.0 - 0.1 K/uL   Immature Granulocytes 0 %   Abs Immature Granulocytes 0.01 0.00 - 0.07 K/uL    Comment: Performed at [facility], [address] [place], [place] [address]  Comprehensive metabolic panel     Status: Abnormal   Collection Time: 05/02/22  7:01 PM  Result Value Ref Range   Sodium 135 135 - 145 mmol/L   Potassium 3.5 3.5 - 5.1 mmol/L   Chloride 101 98 - 111 mmol/L   CO2 24 22 - 32 mmol/L   Glucose, Bld 102 (H) 70 - 99 mg/dL    Comment: Glucose reference range  applies only to samples taken after fasting for at least 8 hours.   BUN 20 8 - 23 mg/dL   Creatinine, Ser 1.45 (H) 0.61 - 1.24 mg/dL   Calcium 8.6 (L) 8.9 - 10.3 mg/dL   Total Protein 7.4 6.5 - 8.1 g/dL   Albumin 4.6 3.5 - 5.0 g/dL   AST 34 15 - 41 U/L   ALT 28 0 - 44 U/L   Alkaline Phosphatase 51 38 - 126 U/L   Total Bilirubin 1.2 0.3 - 1.2 mg/dL   GFR, Estimated 55 (L) >60 mL/min    Comment: (NOTE) Calculated using the CKD-EPI Creatinine Equation (2021)    Anion gap 10 5 - 15    Comment: Performed at [facility], [address] [place], [place] [address]  Ethanol     Status: None   Collection Time: 05/02/22  7:01 PM  Result Value Ref Range   Alcohol, Ethyl (B) <10 <10 mg/dL    Comment: (NOTE) Lowest detectable limit for serum alcohol is 10 mg/dL.  For medical purposes only. Performed at [facility], [address] [place], [place] [address]   Resp panel by RT-PCR (RSV, Flu A&B, Covid) Anterior Nasal Swab     Status: None   Collection Time: 05/02/22  7:01 PM   Specimen: Anterior Nasal Swab  Result Value Ref Range   SARS Coronavirus 2 by RT PCR NEGATIVE NEGATIVE    Comment: (NOTE) SARS-CoV-2 target nucleic acids are NOT DETECTED.  The SARS-CoV-2 RNA is generally detectable in upper respiratory specimens during the acute phase of infection. The lowest concentration of SARS-CoV-2 viral copies this assay can detect is 138 copies/mL. A negative result does not preclude SARS-Cov-2 infection and should not be used as the sole basis for treatment or other patient management decisions. A negative result may occur with  improper specimen collection/handling, submission of specimen other than nasopharyngeal swab, presence of viral mutation(s) within the areas targeted by this assay, and inadequate number of viral copies(<138 copies/mL). A negative result must be combined with clinical observations, patient history, and epidemiological information. The  expected result is Negative.  Fact Sheet for Patients:  EntrepreneurPulse.com.au  Fact Sheet for Healthcare Providers:  IncredibleEmployment.be  This test is no t yet approved or cleared by the [place] FDA and  has been authorized for detection and/or  diagnosis of SARS-CoV-2 by FDA under an Emergency Use Authorization (EUA). This EUA will remain  in effect (meaning this test can be used) for the duration of the COVID-19 declaration under Section 564(b)(1) of the Act, 21 U.S.C.section 360bbb-3(b)(1), unless the authorization is terminated  or revoked sooner.       Influenza A by PCR NEGATIVE NEGATIVE   Influenza B by PCR NEGATIVE NEGATIVE    Comment: (NOTE) The Xpert Xpress SARS-CoV-2/FLU/RSV plus assay is intended as an aid in the diagnosis of influenza from Nasopharyngeal swab specimens and should not be used as a sole basis for treatment. Nasal washings and aspirates are unacceptable for Xpert Xpress SARS-CoV-2/FLU/RSV testing.  Fact Sheet for Patients: EntrepreneurPulse.com.au  Fact Sheet for Healthcare Providers: IncredibleEmployment.be  This test is not yet approved or cleared by the [place] FDA and has been authorized for detection and/or diagnosis of SARS-CoV-2 by FDA under an Emergency Use Authorization (EUA). This EUA will remain in effect (meaning this test can be used) for the duration of the COVID-19 declaration under Section 564(b)(1) of the Act, 21 U.S.C. section 360bbb-3(b)(1), unless the authorization is terminated or revoked.     Resp Syncytial Virus by PCR NEGATIVE NEGATIVE    Comment: (NOTE) Fact Sheet for Patients: EntrepreneurPulse.com.au  Fact Sheet for Healthcare Providers: IncredibleEmployment.be  This test is not yet approved or cleared by the [place] FDA and has been authorized for detection and/or diagnosis of  SARS-CoV-2 by FDA under an Emergency Use Authorization (EUA). This EUA will remain in effect (meaning this test can be used) for the duration of the COVID-19 declaration under Section 564(b)(1) of the Act, 21 U.S.C. section 360bbb-3(b)(1), unless the authorization is terminated or revoked.  Performed at [facility], [address] [place], [place] [address]   Blood culture (routine x 2)     Status: None (Preliminary result)   Collection Time: 05/02/22  7:54 PM   Specimen: BLOOD  Result Value Ref Range   Specimen Description BLOOD BLOOD RIGHT ARM    Special Requests      BOTTLES DRAWN AEROBIC AND ANAEROBIC Blood Culture adequate volume   Culture      NO GROWTH 2 DAYS Performed at [facility], [address], [place], [place] [address]    Report Status PENDING   Lactic acid, plasma     Status: None   Collection Time: 05/02/22  7:54 PM  Result Value Ref Range   Lactic Acid, Venous 1.5 0.5 - 1.9 mmol/L    Comment: Performed at [facility], [address] [place], [place] [address]  HIV Antibody (routine testing w rflx)     Status: None   Collection Time: 05/02/22  7:54 PM  Result Value Ref Range   HIV Screen 4th Generation wRfx Non Reactive Non Reactive    Comment: Performed at [facility], [place] [address] [place], [place] [address]  Urinalysis, Complete w Microscopic Urine, Clean Catch     Status: Abnormal   Collection Time: 05/03/22  1:30 AM  Result Value Ref Range   Color, Urine YELLOW (A) YELLOW   APPearance HAZY (A) CLEAR   Specific Gravity, Urine >1.046 (H) 1.005 - 1.030   pH 5.0 5.0 - 8.0   Glucose, UA NEGATIVE NEGATIVE mg/dL   Hgb urine dipstick MODERATE (A) NEGATIVE   Bilirubin Urine NEGATIVE NEGATIVE   Ketones, ur 5 (A) NEGATIVE mg/dL   Protein, ur NEGATIVE NEGATIVE mg/dL   Nitrite NEGATIVE NEGATIVE  Leukocytes,Ua NEGATIVE NEGATIVE   RBC / HPF 11-20 0 - 5 RBC/hpf   WBC, UA 0-5 0 - 5 WBC/hpf   Bacteria, UA NONE SEEN NONE  SEEN   Squamous Epithelial / LPF NONE SEEN 0 - 5   Mucus PRESENT     Comment: Performed at [facility], [address] [place], [place] [address]  Blood gas, arterial     Status: Abnormal   Collection Time: 05/03/22  2:01 AM  Result Value Ref Range   FIO2 21 %   Delivery systems ROOM AIR    pH, Arterial 7.45 7.35 - 7.45   pCO2 arterial 30 (L) 32 - 48 mmHg   pO2, Arterial 124 (H) 83 - 108 mmHg   Bicarbonate 20.9 20.0 - 28.0 mmol/L   Acid-base deficit 2.1 (H) 0.0 - 2.0 mmol/L   O2 Saturation 100 %   Patient temperature 37.0    Collection site RIGHT RADIAL    Allens test (pass/fail) PASS PASS    Comment: Performed at [facility] , [address] [place], [place] [address]  Lactic acid, plasma     Status: None   Collection Time: 05/03/22  5:00 AM  Result Value Ref Range   Lactic Acid, Venous 1.5 0.5 - 1.9 mmol/L    Comment: Performed at [facility], [address] [place], [place] [address]  Sedimentation rate     Status: None   Collection Time: 05/03/22  6:04 AM  Result Value Ref Range   Sed Rate 13 0 - 20 mm/hr    Comment: Performed at [facility], [address] [place], [place] [address]  RPR     Status: None   Collection Time: 05/03/22  6:04 AM  Result Value Ref Range   RPR Ser Ql NON REACTIVE NON REACTIVE    Comment: Performed at [place] [facility] [address] [place], [place] [address]  TSH     Status: None   Collection Time: 05/03/22  6:04 AM  Result Value Ref Range   TSH 0.664 0.350 - 4.500 uIU/mL    Comment: Performed by a 3rd Generation assay with a functional sensitivity of <=0.01 uIU/mL. Performed at [facility], [address] [place], [place] [address]   HIV Antibody (routine testing w rflx)     Status: None   Collection Time: 05/03/22  6:04 AM  Result Value Ref Range   HIV Screen 4th Generation wRfx Non Reactive Non Reactive    Comment: Performed at [facility], [place] [address]  [place],  [address]  Comprehensive metabolic panel     Status: Abnormal   Collection Time: 05/03/22  6:04 AM  Result Value Ref Range   Sodium 136 135 - 145 mmol/L   Potassium 4.2 3.5 - 5.1 mmol/L    Comment: HEMOLYSIS AT THIS LEVEL MAY AFFECT RESULT   Chloride 104 98 - 111 mmol/L   CO2 21 (L) 22 - 32 mmol/L   Glucose, Bld 155 (H) 70 - 99 mg/dL    Comment: Glucose reference range applies only to samples taken after fasting for at least 8 hours.   BUN 23 8 - 23 mg/dL   Creatinine, Ser 1.60 (H) 0.61 - 1.24 mg/dL   Calcium 8.1 (L) 8.9 - 10.3 mg/dL   Total Protein 6.9 6.5 - 8.1 g/dL   Albumin 4.1 3.5 - 5.0 g/dL   AST 42 (H) 15 - 41 U/L    Comment: HEMOLYSIS AT THIS LEVEL  MAY AFFECT RESULT   ALT 30 0 - 44 U/L    Comment: HEMOLYSIS AT THIS LEVEL MAY AFFECT RESULT   Alkaline Phosphatase 41 38 - 126 U/L   Total Bilirubin 1.2 0.3 - 1.2 mg/dL    Comment: HEMOLYSIS AT THIS LEVEL MAY AFFECT RESULT   GFR, Estimated 49 (L) >60 mL/min    Comment: (NOTE) Calculated using the CKD-EPI Creatinine Equation (2021)    Anion gap 11 5 - 15    Comment: Performed at [facility], [address] [place], [place] [address]  CBC     Status: Abnormal   Collection Time: 05/03/22  6:04 AM  Result Value Ref Range   WBC 4.8 4.0 - 10.5 K/uL   RBC 4.36 4.22 - 5.81 MIL/uL   Hemoglobin 12.5 (L) 13.0 - 17.0 g/dL   HCT 39.1 39.0 - 52.0 %   MCV 89.7 80.0 - 100.0 fL   MCH 28.7 26.0 - 34.0 pg   MCHC 32.0 30.0 - 36.0 g/dL   RDW 14.0 11.5 - 15.5 %   Platelets 127 (L) 150 - 400 K/uL   nRBC 0.0 0.0 - 0.2 %    Comment: Performed at [facility], [address], [place], [place] [address]  Troponin I (High Sensitivity)     Status: None   Collection Time: 05/03/22  6:04 AM  Result Value Ref Range   Troponin I (High Sensitivity) 12 <18 ng/L    Comment: (NOTE) Elevated high sensitivity troponin I (hsTnI) values and significant  changes across serial measurements may suggest ACS but many other   chronic and acute conditions are known to elevate hsTnI results.  Refer to the "Links" section for chest pain algorithms and additional  guidance. Performed at [facility], [address] [place], [place] [address]   Procalcitonin - Baseline     Status: None   Collection Time: 05/03/22  6:04 AM  Result Value Ref Range   Procalcitonin <0.10 ng/mL    Comment:        Interpretation: PCT (Procalcitonin) <= 0.5 ng/mL: Systemic infection (sepsis) is not likely. Local bacterial infection is possible. (NOTE)       Sepsis PCT Algorithm           Lower Respiratory Tract                                      Infection PCT Algorithm    ----------------------------     ----------------------------         PCT < 0.25 ng/mL                PCT < 0.10 ng/mL          Strongly encourage             Strongly discourage   discontinuation of antibiotics    initiation of antibiotics    ----------------------------     -----------------------------       PCT 0.25 - 0.50 ng/mL            PCT 0.10 - 0.25 ng/mL               OR       >80% decrease in PCT            Discourage initiation of  antibiotics      Encourage discontinuation           of antibiotics    ----------------------------     -----------------------------         PCT >= 0.50 ng/mL              PCT 0.26 - 0.50 ng/mL               AND        <80% decrease in PCT             Encourage initiation of                                             antibiotics       Encourage continuation           of antibiotics    ----------------------------     -----------------------------        PCT >= 0.50 ng/mL                  PCT > 0.50 ng/mL               AND         increase in PCT                  Strongly encourage                                      initiation of antibiotics    Strongly encourage escalation           of antibiotics                                      -----------------------------                                           PCT <= 0.25 ng/mL                                                 OR                                        > 80% decrease in PCT                                      Discontinue / Do not initiate                                             antibiotics  Performed at [facility], [address] [place], [place] [address]   Respiratory (~20 pathogens) panel by PCR     Status: Abnormal  Collection Time: 05/03/22 10:27 AM   Specimen: Nasopharyngeal Swab; Respiratory  Result Value Ref Range   Adenovirus NOT DETECTED NOT DETECTED   Coronavirus 229E NOT DETECTED NOT DETECTED    Comment: (NOTE) The Coronavirus on the Respiratory Panel, DOES NOT test for the novel  Coronavirus (2019 nCoV)    Coronavirus HKU1 NOT DETECTED NOT DETECTED   Coronavirus NL63 NOT DETECTED NOT DETECTED   Coronavirus OC43 NOT DETECTED NOT DETECTED   Metapneumovirus NOT DETECTED NOT DETECTED   Rhinovirus / Enterovirus NOT DETECTED NOT DETECTED   Influenza A H1 2009 DETECTED (A) NOT DETECTED   Influenza B NOT DETECTED NOT DETECTED   Parainfluenza Virus 1 NOT DETECTED NOT DETECTED   Parainfluenza Virus 2 NOT DETECTED NOT DETECTED   Parainfluenza Virus 3 NOT DETECTED NOT DETECTED   Parainfluenza Virus 4 NOT DETECTED NOT DETECTED   Respiratory Syncytial Virus NOT DETECTED NOT DETECTED   Bordetella pertussis NOT DETECTED NOT DETECTED   Bordetella Parapertussis NOT DETECTED NOT DETECTED   Chlamydophila pneumoniae NOT DETECTED NOT DETECTED   Mycoplasma pneumoniae NOT DETECTED NOT DETECTED    Comment: Performed at [facility], [place] [address], [place], [place] [address]  Basic metabolic panel     Status: Abnormal   Collection Time: 05/04/22  6:00 AM  Result Value Ref Range   Sodium 142 135 - 145 mmol/L   Potassium 4.0 3.5 - 5.1 mmol/L   Chloride 108 98 - 111 mmol/L   CO2 25 22 - 32 mmol/L   Glucose, Bld 123 (H) 70 - 99 mg/dL     Comment: Glucose reference range applies only to samples taken after fasting for at least 8 hours.   BUN 25 (H) 8 - 23 mg/dL   Creatinine, Ser 1.10 0.61 - 1.24 mg/dL   Calcium 8.5 (L) 8.9 - 10.3 mg/dL   GFR, Estimated >60 >60 mL/min    Comment: (NOTE) Calculated using the CKD-EPI Creatinine Equation (2021)    Anion gap 9 5 - 15    Comment: Performed at [facility], [address] [place], [place] [address]  CBC     Status: Abnormal   Collection Time: 05/04/22  6:00 AM  Result Value Ref Range   WBC 10.9 (H) 4.0 - 10.5 K/uL   RBC 4.94 4.22 - 5.81 MIL/uL   Hemoglobin 14.0 13.0 - 17.0 g/dL   HCT 43.1 39.0 - 52.0 %   MCV 87.2 80.0 - 100.0 fL   MCH 28.3 26.0 - 34.0 pg   MCHC 32.5 30.0 - 36.0 g/dL   RDW 14.2 11.5 - 15.5 %   Platelets 162 150 - 400 K/uL   nRBC 0.0 0.0 - 0.2 %    Comment: Performed at [facility], [address] [place], [place] [address]  Magnesium     Status: Abnormal   Collection Time: 05/04/22  6:00 AM  Result Value Ref Range   Magnesium 2.8 (H) 1.7 - 2.4 mg/dL    Comment: Performed at [facility], [address], [place], [place] [address]    Recent Results (from the past 240 hour(s))  Resp panel by RT-PCR (RSV, Flu A&B, Covid) Anterior Nasal Swab     Status: None   Collection Time: 05/02/22  7:01 PM   Specimen: Anterior Nasal Swab  Result Value Ref Range Status   SARS Coronavirus 2 by RT PCR NEGATIVE NEGATIVE Final    Comment: (NOTE) SARS-CoV-2 target nucleic acids are NOT DETECTED.  The SARS-CoV-2 RNA  is generally detectable in upper respiratory specimens during the acute phase of infection. The lowest concentration of SARS-CoV-2 viral copies this assay can detect is 138 copies/mL. A negative result does not preclude SARS-Cov-2 infection and should not be used as the sole basis for treatment or other patient management decisions. A negative result may occur with  improper specimen collection/handling, submission of  specimen other than nasopharyngeal swab, presence of viral mutation(s) within the areas targeted by this assay, and inadequate number of viral copies(<138 copies/mL). A negative result must be combined with clinical observations, patient history, and epidemiological information. The expected result is Negative.  Fact Sheet for Patients:  EntrepreneurPulse.com.au  Fact Sheet for Healthcare Providers:  IncredibleEmployment.be  This test is no t yet approved or cleared by the [place] FDA and  has been authorized for detection and/or diagnosis of SARS-CoV-2 by FDA under an Emergency Use Authorization (EUA). This EUA will remain  in effect (meaning this test can be used) for the duration of the COVID-19 declaration under Section 564(b)(1) of the Act, 21 U.S.C.section 360bbb-3(b)(1), unless the authorization is terminated  or revoked sooner.       Influenza A by PCR NEGATIVE NEGATIVE Final   Influenza B by PCR NEGATIVE NEGATIVE Final    Comment: (NOTE) The Xpert Xpress SARS-CoV-2/FLU/RSV plus assay is intended as an aid in the diagnosis of influenza from Nasopharyngeal swab specimens and should not be used as a sole basis for treatment. Nasal washings and aspirates are unacceptable for Xpert Xpress SARS-CoV-2/FLU/RSV testing.  Fact Sheet for Patients: EntrepreneurPulse.com.au  Fact Sheet for Healthcare Providers: IncredibleEmployment.be  This test is not yet approved or cleared by the [place] FDA and has been authorized for detection and/or diagnosis of SARS-CoV-2 by FDA under an Emergency Use Authorization (EUA). This EUA will remain in effect (meaning this test can be used) for the duration of the COVID-19 declaration under Section 564(b)(1) of the Act, 21 U.S.C. section 360bbb-3(b)(1), unless the authorization is terminated or revoked.     Resp Syncytial Virus by PCR NEGATIVE NEGATIVE Final     Comment: (NOTE) Fact Sheet for Patients: EntrepreneurPulse.com.au  Fact Sheet for Healthcare Providers: IncredibleEmployment.be  This test is not yet approved or cleared by the [place] FDA and has been authorized for detection and/or diagnosis of SARS-CoV-2 by FDA under an Emergency Use Authorization (EUA). This EUA will remain in effect (meaning this test can be used) for the duration of the COVID-19 declaration under Section 564(b)(1) of the Act, 21 U.S.C. section 360bbb-3(b)(1), unless the authorization is terminated or revoked.  Performed at [facility], [address] [place], [place] [address]   Blood culture (routine x 2)     Status: None (Preliminary result)   Collection Time: 05/02/22  7:54 PM   Specimen: BLOOD  Result Value Ref Range Status   Specimen Description BLOOD BLOOD RIGHT ARM  Final   Special Requests   Final    BOTTLES DRAWN AEROBIC AND ANAEROBIC Blood Culture adequate volume   Culture   Final    NO GROWTH 2 DAYS Performed at [facility], [address], [place], [place] [address]    Report Status PENDING  Incomplete  Respiratory (~20 pathogens) panel by PCR     Status: Abnormal   Collection Time: 05/03/22 10:27 AM   Specimen: Nasopharyngeal Swab; Respiratory  Result Value Ref Range Status   Adenovirus NOT DETECTED NOT DETECTED Final   Coronavirus 229E NOT DETECTED NOT DETECTED Final  Comment: (NOTE) The Coronavirus on the Respiratory Panel, DOES NOT test for the novel  Coronavirus (2019 nCoV)    Coronavirus HKU1 NOT DETECTED NOT DETECTED Final   Coronavirus NL63 NOT DETECTED NOT DETECTED Final   Coronavirus OC43 NOT DETECTED NOT DETECTED Final   Metapneumovirus NOT DETECTED NOT DETECTED Final   Rhinovirus / Enterovirus NOT DETECTED NOT DETECTED Final   Influenza A H1 2009 DETECTED (A) NOT DETECTED Final   Influenza B NOT DETECTED NOT DETECTED Final   Parainfluenza Virus 1 NOT  DETECTED NOT DETECTED Final   Parainfluenza Virus 2 NOT DETECTED NOT DETECTED Final   Parainfluenza Virus 3 NOT DETECTED NOT DETECTED Final   Parainfluenza Virus 4 NOT DETECTED NOT DETECTED Final   Respiratory Syncytial Virus NOT DETECTED NOT DETECTED Final   Bordetella pertussis NOT DETECTED NOT DETECTED Final   Bordetella Parapertussis NOT DETECTED NOT DETECTED Final   Chlamydophila pneumoniae NOT DETECTED NOT DETECTED Final   Mycoplasma pneumoniae NOT DETECTED NOT DETECTED Final    Comment: Performed at [facility], [place] [address] [place], [place] [address]    Lipid Panel No results for input(s): "CHOL", "TRIG", "HDL", "CHOLHDL", "VLDL", "LDLCALC" in the last 72 hours.  Studies/Results: EEG adult  Result Date: 05/04/2022 Lora Havens, MD     05/04/2022  5:23 PM Patient Name: Jose Hayes MRN: [id number] Epilepsy Attending: Lora Havens Referring Physician/Provider: Kerney Elbe, MD Date: 05/04/2022 Duration: 21.16 mins Patient history: 61yo M with ams. EEG to evaluate for seizure. Level of alertness: Awake, drowsy AEDs during EEG study: None Technical aspects: This EEG study was done with scalp electrodes positioned according to the 10-20 International system of electrode placement. Electrical activity was reviewed with band pass filter of 1-'70Hz'$ , sensitivity of 7 uV/mm, display speed of 23m/sec with a '60Hz'$  notched filter applied as appropriate. EEG data were recorded continuously and digitally stored.  Video monitoring was available and reviewed as appropriate. Description: The posterior dominant rhythm consists of 8 Hz activity of moderate voltage (25-35 uV) seen predominantly in posterior head regions, symmetric and reactive to eye opening and eye closing. Drowsiness was characterized by attenuation of the posterior background rhythm. Hyperventilation and photic stimulation were not performed.   IMPRESSION: This study is within normal limits. No seizures or  epileptiform discharges were seen throughout the recording. PLora Havens  MR BRAIN WO CONTRAST  Result Date: 05/03/2022 CLINICAL DATA:  Initial evaluation for altered mental status, unknown cause. EXAM: MRI HEAD WITHOUT CONTRAST TECHNIQUE: Multiplanar, multiecho pulse sequences of the brain and surrounding structures were obtained without intravenous contrast. COMPARISON:  Prior CTs from 05/02/2022. FINDINGS: Brain: Cerebral volume within normal limits for age. No significant cerebral white matter disease. No abnormal foci of restricted diffusion to suggest acute or subacute ischemia. Gray-white matter differentiation maintained. No areas of chronic cortical infarction. No acute or chronic intracranial blood products. No mass lesion, midline shift or mass effect. No hydrocephalus or extra-axial fluid collection. Pituitary gland and suprasellar region within normal limits. Vascular: Major intracranial vascular flow voids are maintained. Skull and upper cervical spine: Craniocervical junction within normal limits. Bone marrow signal intensity normal. No scalp soft tissue abnormality. Sinuses/Orbits: Globes and orbital soft tissues within normal limits. Scattered polypoid mucosal thickening present throughout the sphenoid ethmoidal and right greater than left maxillary sinuses. No mastoid effusion. Other: None. IMPRESSION: 1. Normal brain MRI for age.  No acute intracranial abnormality. 2. Paranasal sinusitis, most pronounced within the right maxillary sinus. Electronically Signed  By: Jeannine Boga M.D.   On: 05/03/2022 04:15   CT CHEST ABDOMEN PELVIS WO CONTRAST  Result Date: 05/02/2022 CLINICAL DATA:  Sepsis EXAM: CT CHEST, ABDOMEN AND PELVIS WITHOUT CONTRAST TECHNIQUE: Multidetector CT imaging of the chest, abdomen and pelvis was performed following the standard protocol without IV contrast. RADIATION DOSE REDUCTION: This exam was performed according to the departmental dose-optimization  program which includes automated exposure control, adjustment of the mA and/or kV according to patient size and/or use of iterative reconstruction technique. COMPARISON:  CT chest abdomen and pelvis 10/25/2016 FINDINGS: CT CHEST FINDINGS Cardiovascular: Right chest wall Port-A-Cath tip at the superior cavoatrial junction. Small pericardial effusion. Normal heart size. Mediastinum/Nodes: No enlarged mediastinal, hilar, or axillary lymph nodes. Thyroid gland, trachea, and esophagus demonstrate no significant findings. Lungs/Pleura: Respiratory motion markedly degrades image interpretation. Scarring/atelectasis in the lingula. Left lower lobe atelectasis. No pleural effusion or pneumothorax. Musculoskeletal: No chest wall mass or suspicious bone lesions identified. CT ABDOMEN PELVIS FINDINGS Hepatobiliary: No focal liver abnormality is seen. Status post cholecystectomy. No biliary dilatation. Pancreas: Unremarkable. No pancreatic ductal dilatation or surrounding inflammatory changes. Spleen: Normal in size without focal abnormality. Adrenals/Urinary Tract: Adrenal glands are unremarkable. Kidneys are normal, without renal calculi, focal lesion, or hydronephrosis. Bladder is unremarkable. Stomach/Bowel: Stomach is within normal limits. Appendix appears normal. No evidence of bowel wall thickening, distention, or inflammatory changes. Vascular/Lymphatic: Mild aortic atherosclerotic calcification. No suspicious lymphadenopathy. Reproductive: Unremarkable. Other: No free intraperitoneal fluid or air. Musculoskeletal: Chronic L5 pars defects. IMPRESSION: No CT findings for sepsis. Electronically Signed   By: Placido Sou M.D.   On: 05/02/2022 22:47   CT ANGIO HEAD NECK W WO CM W PERF (CODE STROKE)  Result Date: 05/02/2022 CLINICAL DATA:  Aphasia EXAM: CT ANGIOGRAPHY HEAD AND NECK CT PERFUSION BRAIN TECHNIQUE: Multidetector CT imaging of the head and neck was performed using the standard protocol during bolus  administration of intravenous contrast. Multiplanar CT image reconstructions and MIPs were obtained to evaluate the vascular anatomy. Carotid stenosis measurements (when applicable) are obtained utilizing NASCET criteria, using the distal internal carotid diameter as the denominator. Multiphase CT imaging of the brain was performed following IV bolus contrast injection. Subsequent parametric perfusion maps were calculated using RAPID software. RADIATION DOSE REDUCTION: This exam was performed according to the departmental dose-optimization program which includes automated exposure control, adjustment of the mA and/or kV according to patient size and/or use of iterative reconstruction technique. CONTRAST:  125m OMNIPAQUE IOHEXOL 350 MG/ML SOLN COMPARISON:  CTA head and neck 06/29/2021, same-day noncontrast CT head FINDINGS: CTA NECK FINDINGS Aortic arch: The aortic arch is normal. The origins of the major branch vessels are patent. The subclavian arteries are patent to the level imaged. Right carotid system: The right common, internal, and external carotid arteries are patent, without hemodynamically significant stenosis or occlusion. There is no dissection or aneurysm. Left carotid system: The left common, internal, and external carotid arteries are patent, without hemodynamically significant stenosis or occlusion. There is no dissection or aneurysm. Vertebral arteries: The vertebral arteries are patent, without hemodynamically significant stenosis or occlusion. There is no dissection or aneurysm. The right vertebral artery is dominant, a normal variant. Skeleton: There is no acute osseous abnormality or suspicious osseous lesion. There is no visible canal hematoma. Other neck: The soft tissues of the neck are unremarkable. Upper chest: There is patchy ground-glass opacity in the left upper lobe. A right chest wall port is noted, incompletely imaged. Review of the MIP images confirms the  above findings CTA HEAD  FINDINGS Anterior circulation: The intracranial ICAs are patent with mild calcified plaque. The bilateral MCAs are patent, without proximal stenosis or occlusion. The bilateral ACAs are patent, without proximal stenosis or occlusion. The anterior communicating artery is normal. There is no aneurysm or AVM. Posterior circulation: The left V4 segment is markedly diminutive after the PICA origin, favored congenital. The dominant right V4 segment is widely patent. The basilar artery is patent. The major cerebellar arteries are patent. The bilateral PCAs are patent, without proximal stenosis or occlusion. Bilateral posterior communicating arteries are identified with a fetal origin of the right. There is no aneurysm or AVM. Venous sinuses: As permitted by contrast timing, patent. Anatomic variants: As above. Review of the MIP images confirms the above findings CT Brain Perfusion Findings: ASPECTS: 10 CBF (<30%) Volume: 52m Perfusion (Tmax>6.0s) volume: 067mMismatch Volume: 77m21mnfarction Location:N/a IMPRESSION: 1. Normal vasculature of the head and neck with no hemodynamically significant stenosis or occlusion. 2. No infarct core or penumbra identified on CT perfusion. 3. Small area of patchy ground-glass opacity in the left upper lobe may be infectious in nature given history of flu. Findings communicated to Dr. StaQuinn Axe 7:34 pm. Electronically Signed   By: PetValetta MoleD.   On: 05/02/2022 19:55   CT HEAD CODE STROKE WO CONTRAST  Result Date: 05/02/2022 CLINICAL DATA:  Code stroke. Fever, weakness, altered mental status. EXAM: CT HEAD WITHOUT CONTRAST TECHNIQUE: Contiguous axial images were obtained from the base of the skull through the vertex without intravenous contrast. RADIATION DOSE REDUCTION: This exam was performed according to the departmental dose-optimization program which includes automated exposure control, adjustment of the mA and/or kV according to patient size and/or use of iterative  reconstruction technique. COMPARISON:  CT head 07/06/2021 FINDINGS: Brain: There is no acute intracranial hemorrhage, extra-axial fluid collection, or acute infarct. Parenchymal volume is normal. The ventricles are normal in size. Gray-white differentiation is preserved. The pituitary and suprasellar region are normal. There is no mass lesion.  There is no mass effect or midline shift. Vascular: No hyperdense vessel or unexpected calcification. Skull: Normal. Negative for fracture or focal lesion. Sinuses/Orbits: There is complete opacification of the right maxillary sinus and a mucous retention cyst in the left maxillary sinus, unchanged. The globes and orbits are unremarkable. Other: None. ASPECTS ([facility] Early CT Score) - Ganglionic level infarction (caudate, lentiform nuclei, internal capsule, insula, M1-M3 cortex): 7 - Supraganglionic infarction (M4-M6 cortex): 3 Total score (0-10 with 10 being normal): 10 IMPRESSION: 1. No acute intracranial pathology. 2. Unchanged complete opacification of the right maxillary sinus. These results were called by telephone at the time of interpretation on 05/02/2022 at 6:56 pm to provider [facility] verbally acknowledged these results. Electronically Signed   By: PetValetta MoleD.   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 He had woken up from a nap at 1500 unable to speak or follow commands. NIHSS was 21 on initial Neurology assessment. CT head showed no acute process. CTA showed no LVO and CT perfusion was negative.  - He is now back to baseline. Currently afebrlie. Neurological exam is negative except for occasional tremor as described above.  - Viral testing came back positive for influenza A.  - MRI brain: Normal brain MRI for age.  No acute intracranial abnormality. Paranasal sinusitis, most pronounced within the right maxillary sinus. - EEG: This study is within normal limits. No seizures or epileptiform discharges were seen throughout the recording.  - The patient's wife states that he has had prior episodes of severe somnolence triggered by physiological or emotional stressors ever since he was diagnosed with adrenal insufficiency secondary to the iatrogenic panhypopituitarism described above. - Overall impression is that the patient's presentation was secondary to adrenal crisis triggered by intercurrent infection with high fever.      Recommendations: - Stable for discharge from a Neurological standpoint - Management of adrenal crisis and appropriate follow ups per primary team.     LOS: 2 days   '@Electronically'$  signed: Dr. Kerney Elbe 05/04/2022  5:57 PM

## 2022-05-04 NOTE — Procedures (Signed)
Patient Name: Jose Hayes  MRN: [id number]  Epilepsy Attending: Lora Havens  Referring Physician/Provider: Kerney Elbe, MD  Date: 05/04/2022 Duration: 21.16 mins  Patient history: 61yo M with ams. EEG to evaluate for seizure.  Level of alertness: Awake, drowsy  AEDs during EEG study: None  Technical aspects: This EEG study was done with scalp electrodes positioned according to the 10-20 International system of electrode placement. Electrical activity was reviewed with band pass filter of 1-'70Hz'$ , sensitivity of 7 uV/mm, display speed of 73m/sec with a '60Hz'$  notched filter applied as appropriate. EEG data were recorded continuously and digitally stored.  Video monitoring was available and reviewed as appropriate.  Description: The posterior dominant rhythm consists of 8 Hz activity of moderate voltage (25-35 uV) seen predominantly in posterior head regions, symmetric and reactive to eye opening and eye closing. Drowsiness was characterized by attenuation of the posterior background rhythm. Hyperventilation and photic stimulation were not performed.     IMPRESSION: This study is within normal limits. No seizures or epileptiform discharges were seen throughout the recording.  Chantale Leugers OBarbra Sarks

## 2022-05-04 NOTE — Discharge Summary (Signed)
Physician Discharge Summary   Jose Hayes  male DOB: 1960-06-16  BDZ:[id number]  PCP: Leone Haven, MD  Admit date: 05/02/2022 Discharge date: 05/04/2022  Admitted From: home Disposition:  home CODE STATUS: Full code  Discharge Instructions     Discharge instructions   Complete by: As directed    You have the Flu, which explains your fever.  Please continue to take your home stress dose steroid and taper down as per your home instruction.     Dr. Enzo Bi [facility] Course:  For full details, please see H&P, progress notes, consult notes and ancillary notes.  Briefly,  Jose Hayes is a 61 y.o. male with medical history significant of hypertension, hx of SVT, Anxiety, OSA on CPAP,  hx of esophageal cancer, Hx of L acoustic neuroma, hypothyroidism, adrenal insufficiency, apparently c/o slight dyspnea and altered mental status starting in am.  Pt notes that his wife has been sick.    Acute metabolic encephalopathy  --pt presented with AMS and high fever, so was started on empiric abx for meningitis.   --MRI neg for acute stroke.  LP attempted in the ED to rule out meningitis, however, was not successful in obtaining sample.  Doubt meningitis, since pt denied neck pain or nuchal indignity PTA or currently.  Abx were not continued. --AMS likely due to adrenal crisis in the setting of flu and high fevers.  Pt and family said pt was supposed to take his stress dose steroid injection at home but didn't.  Mental status back to normal the next day after receiving stress dose steroid with IV Solu-cortef.  Fever 2/2 Flu A --pt presented with fever, cough and congestion, with sick family contact.  CT c/a/p without acute finding.  Initially neg for covid and flu A/B, however, subsequent RVP was pos for Flu A.  Possible adrenal crisis Adrenal insufficiency --Pt received IV solu-cortef during hospitalization.  Discharged on home oral Cortef.    Hypothyroidism Cont Levothyroxine 88 micrograms po qday   Hypertension --hold home amlodipine due to soft BP   Anxiety Cont Xanax '1mg'$  po tid prn  Cont Duloxetine '30mg'$  po bid   OSA on home CPAP --ABG without hypercapnia  --Cpap per home setting   Unless noted above, medications under "STOP" list are ones pt was not taking PTA.  Discharge Diagnoses:  Principal Problem:   AMS (altered mental status) Active Problems:   Hyperlipidemia   Hypertension   Adrenal insufficiency ([place])   Hypothyroidism   30 Day Unplanned Readmission Risk Score    Flowsheet Row ED from 05/02/2022 in [facility]  30 Day Unplanned Readmission Risk Score (%) 15.09 Filed at 05/04/2022 1600       This score is the patient's risk of an unplanned readmission within 30 days of being discharged (0 -100%). The score is based on dignosis, age, lab data, medications, orders, and past utilization.   Low:  0-14.9   Medium: 15-21.9   High: 22-29.9   Extreme: 30 and above         Discharge Instructions:  Allergies as of 05/04/2022   No Known Allergies      Medication List     STOP taking these medications    busPIRone 10 MG tablet Commonly known as: BUSPAR   oxyCODONE-acetaminophen 5-325 MG tablet Commonly known as: Percocet   rosuvastatin 20 MG tablet Commonly known as: Crestor   tiZANidine 4 MG tablet Commonly  known as: Zanaflex   verapamil 40 MG tablet Commonly known as: CALAN       TAKE these medications    ALPRAZolam 1 MG tablet Commonly known as: XANAX Take 1 mg by mouth 3 (three) times daily as needed for anxiety.   amLODipine 5 MG tablet Commonly known as: NORVASC Hold until followup with outpatient provider due to low blood pressure. What changed:  how much to take how to take this when to take this additional instructions   DULoxetine 30 MG capsule Commonly known as: CYMBALTA Take 30 mg by mouth 2 (two) times daily.    gabapentin 100 MG capsule Commonly known as: NEURONTIN Take 100 mg by mouth 3 (three) times daily.   hydrocortisone 10 MG tablet Commonly known as: CORTEF Take 10-15 mg by mouth See admin instructions. Take 1.5 tablets (15 mg) by mouth in the morning (1 hour after levothyroxine) & take 1 tablet (10 mg) by mouth in the afternoon at 2 pm.   hydrocortisone sodium succinate 100 MG injection Commonly known as: SOLU-CORTEF Inject 2 mLs (100 mg total) into the muscle as needed. For adrenal crisis.   hydrOXYzine 10 MG tablet Commonly known as: ATARAX Take 10 mg by mouth 3 (three) times daily as needed.   levothyroxine 88 MCG tablet Commonly known as: SYNTHROID Take 88 mcg by mouth daily before breakfast.   predniSONE 2.5 MG tablet Commonly known as: DELTASONE Take 7.5 mg by mouth daily.   testosterone cypionate 200 MG/ML injection Commonly known as: DEPOTESTOSTERONE CYPIONATE Inject into the muscle every Monday. 0.27ms         Follow-up Information     SLeone Haven MD Follow up in 1 week(s).   Specialty: Family Medicine Contact information: [address] [phone]                No Known Allergies   The results of significant diagnostics from this hospitalization (including imaging, microbiology, ancillary and laboratory) are listed below for reference.   Consultations:   Procedures/Studies: EEG adult  Result Date: 112/31/2023YLora Havens MD     112-31-2023 5:23 PM Patient Name: MWELDON NOURIMRN: [id number] Attending: PLora HavensReferring Physician/Provider: LKerney Elbe MD Date: 1Dec 31, 2023Duration: 21.16 mins Patient history: 89yoM with ams. EEG to evaluate for seizure. Level of alertness: Awake, drowsy AEDs during EEG study: None Technical aspects: This EEG study was done with scalp electrodes positioned according to the 10-20 International system of electrode placement. Electrical  activity was reviewed with band pass filter of 1-'70Hz'$ , sensitivity of 7 uV/mm, display speed of 355msec with a '60Hz'$  notched filter applied as appropriate. EEG data were recorded continuously and digitally stored.  Video monitoring was available and reviewed as appropriate. Description: The posterior dominant rhythm consists of 8 Hz activity of moderate voltage (25-35 uV) seen predominantly in posterior head regions, symmetric and reactive to eye opening and eye closing. Drowsiness was characterized by attenuation of the posterior background rhythm. Hyperventilation and photic stimulation were not performed.   IMPRESSION: This study is within normal limits. No seizures or epileptiform discharges were seen throughout the recording. PrLora Havens MR BRAIN WO CONTRAST  Result Date: 05/03/2022 CLINICAL DATA:  Initial evaluation for altered mental status, unknown cause. EXAM: MRI HEAD WITHOUT CONTRAST TECHNIQUE: Multiplanar, multiecho pulse sequences of the brain and surrounding structures were obtained without intravenous contrast. COMPARISON:  Prior CTs from 05/02/2022. FINDINGS: Brain: Cerebral volume  within normal limits for age. No significant cerebral white matter disease. No abnormal foci of restricted diffusion to suggest acute or subacute ischemia. Gray-white matter differentiation maintained. No areas of chronic cortical infarction. No acute or chronic intracranial blood products. No mass lesion, midline shift or mass effect. No hydrocephalus or extra-axial fluid collection. Pituitary gland and suprasellar region within normal limits. Vascular: Major intracranial vascular flow voids are maintained. Skull and upper cervical spine: Craniocervical junction within normal limits. Bone marrow signal intensity normal. No scalp soft tissue abnormality. Sinuses/Orbits: Globes and orbital soft tissues within normal limits. Scattered polypoid mucosal thickening present throughout the sphenoid ethmoidal and right  greater than left maxillary sinuses. No mastoid effusion. Other: None. IMPRESSION: 1. Normal brain MRI for age.  No acute intracranial abnormality. 2. Paranasal sinusitis, most pronounced within the right maxillary sinus. Electronically Signed   By: Jeannine Boga M.D.   On: 05/03/2022 04:15   CT CHEST ABDOMEN PELVIS WO CONTRAST  Result Date: 05/02/2022 CLINICAL DATA:  Sepsis EXAM: CT CHEST, ABDOMEN AND PELVIS WITHOUT CONTRAST TECHNIQUE: Multidetector CT imaging of the chest, abdomen and pelvis was performed following the standard protocol without IV contrast. RADIATION DOSE REDUCTION: This exam was performed according to the departmental dose-optimization program which includes automated exposure control, adjustment of the mA and/or kV according to patient size and/or use of iterative reconstruction technique. COMPARISON:  CT chest abdomen and pelvis 10/25/2016 FINDINGS: CT CHEST FINDINGS Cardiovascular: Right chest wall Port-A-Cath tip at the superior cavoatrial junction. Small pericardial effusion. Normal heart size. Mediastinum/Nodes: No enlarged mediastinal, hilar, or axillary lymph nodes. Thyroid gland, trachea, and esophagus demonstrate no significant findings. Lungs/Pleura: Respiratory motion markedly degrades image interpretation. Scarring/atelectasis in the lingula. Left lower lobe atelectasis. No pleural effusion or pneumothorax. Musculoskeletal: No chest wall mass or suspicious bone lesions identified. CT ABDOMEN PELVIS FINDINGS Hepatobiliary: No focal liver abnormality is seen. Status post cholecystectomy. No biliary dilatation. Pancreas: Unremarkable. No pancreatic ductal dilatation or surrounding inflammatory changes. Spleen: Normal in size without focal abnormality. Adrenals/Urinary Tract: Adrenal glands are unremarkable. Kidneys are normal, without renal calculi, focal lesion, or hydronephrosis. Bladder is unremarkable. Stomach/Bowel: Stomach is within normal limits. Appendix appears  normal. No evidence of bowel wall thickening, distention, or inflammatory changes. Vascular/Lymphatic: Mild aortic atherosclerotic calcification. No suspicious lymphadenopathy. Reproductive: Unremarkable. Other: No free intraperitoneal fluid or air. Musculoskeletal: Chronic L5 pars defects. IMPRESSION: No CT findings for sepsis. Electronically Signed   By: Placido Sou M.D.   On: 05/02/2022 22:47   CT ANGIO HEAD NECK W WO CM W PERF (CODE STROKE)  Result Date: 05/02/2022 CLINICAL DATA:  Aphasia EXAM: CT ANGIOGRAPHY HEAD AND NECK CT PERFUSION BRAIN TECHNIQUE: Multidetector CT imaging of the head and neck was performed using the standard protocol during bolus administration of intravenous contrast. Multiplanar CT image reconstructions and MIPs were obtained to evaluate the vascular anatomy. Carotid stenosis measurements (when applicable) are obtained utilizing NASCET criteria, using the distal internal carotid diameter as the denominator. Multiphase CT imaging of the brain was performed following IV bolus contrast injection. Subsequent parametric perfusion maps were calculated using RAPID software. RADIATION DOSE REDUCTION: This exam was performed according to the departmental dose-optimization program which includes automated exposure control, adjustment of the mA and/or kV according to patient size and/or use of iterative reconstruction technique. CONTRAST:  164m OMNIPAQUE IOHEXOL 350 MG/ML SOLN COMPARISON:  CTA head and neck 06/29/2021, same-day noncontrast CT head FINDINGS: CTA NECK FINDINGS Aortic arch: The aortic arch is normal. The origins of the major  branch vessels are patent. The subclavian arteries are patent to the level imaged. Right carotid system: The right common, internal, and external carotid arteries are patent, without hemodynamically significant stenosis or occlusion. There is no dissection or aneurysm. Left carotid system: The left common, internal, and external carotid arteries are  patent, without hemodynamically significant stenosis or occlusion. There is no dissection or aneurysm. Vertebral arteries: The vertebral arteries are patent, without hemodynamically significant stenosis or occlusion. There is no dissection or aneurysm. The right vertebral artery is dominant, a normal variant. Skeleton: There is no acute osseous abnormality or suspicious osseous lesion. There is no visible canal hematoma. Other neck: The soft tissues of the neck are unremarkable. Upper chest: There is patchy ground-glass opacity in the left upper lobe. A right chest wall port is noted, incompletely imaged. Review of the MIP images confirms the above findings CTA HEAD FINDINGS Anterior circulation: The intracranial ICAs are patent with mild calcified plaque. The bilateral MCAs are patent, without proximal stenosis or occlusion. The bilateral ACAs are patent, without proximal stenosis or occlusion. The anterior communicating artery is normal. There is no aneurysm or AVM. Posterior circulation: The left V4 segment is markedly diminutive after the PICA origin, favored congenital. The dominant right V4 segment is widely patent. The basilar artery is patent. The major cerebellar arteries are patent. The bilateral PCAs are patent, without proximal stenosis or occlusion. Bilateral posterior communicating arteries are identified with a fetal origin of the right. There is no aneurysm or AVM. Venous sinuses: As permitted by contrast timing, patent. Anatomic variants: As above. Review of the MIP images confirms the above findings CT Brain Perfusion Findings: ASPECTS: 10 CBF (<30%) Volume: 44m Perfusion (Tmax>6.0s) volume: 0109mMismatch Volume: 47m71mnfarction Location:N/a IMPRESSION: 1. Normal vasculature of the head and neck with no hemodynamically significant stenosis or occlusion. 2. No infarct core or penumbra identified on CT perfusion. 3. Small area of patchy ground-glass opacity in the left upper lobe may be infectious in  nature given history of flu. Findings communicated to Dr. StaQuinn Axe 7:34 pm. Electronically Signed   By: PetValetta MoleD.   On: 05/02/2022 19:55   CT HEAD CODE STROKE WO CONTRAST  Result Date: 05/02/2022 CLINICAL DATA:  Code stroke. Fever, weakness, altered mental status. EXAM: CT HEAD WITHOUT CONTRAST TECHNIQUE: Contiguous axial images were obtained from the base of the skull through the vertex without intravenous contrast. RADIATION DOSE REDUCTION: This exam was performed according to the departmental dose-optimization program which includes automated exposure control, adjustment of the mA and/or kV according to patient size and/or use of iterative reconstruction technique. COMPARISON:  CT head 07/06/2021 FINDINGS: Brain: There is no acute intracranial hemorrhage, extra-axial fluid collection, or acute infarct. Parenchymal volume is normal. The ventricles are normal in size. Gray-white differentiation is preserved. The pituitary and suprasellar region are normal. There is no mass lesion.  There is no mass effect or midline shift. Vascular: No hyperdense vessel or unexpected calcification. Skull: Normal. Negative for fracture or focal lesion. Sinuses/Orbits: There is complete opacification of the right maxillary sinus and a mucous retention cyst in the left maxillary sinus, unchanged. The globes and orbits are unremarkable. Other: None. ASPECTS ([facility] Program Early CT Score) - Ganglionic level infarction (caudate, lentiform nuclei, internal capsule, insula, M1-M3 cortex): 7 - Supraganglionic infarction (M4-M6 cortex): 3 Total score (0-10 with 10 being normal): 10 IMPRESSION: 1. No acute intracranial pathology. 2. Unchanged complete opacification of the right maxillary sinus. These results were called by telephone  at the time of interpretation on 05/02/2022 at 6:56 pm to provider [facility], who verbally acknowledged these results. Electronically Signed   By: Valetta Mole M.D.   On: 05/02/2022 19:01       Labs: BNP (last 3 results) No results for input(s): "BNP" in the last 8760 hours. Basic Metabolic Panel: Recent Labs  Lab 05/02/22 1901 05/03/22 0604 05/04/22 0600  NA 135 136 142  K 3.5 4.2 4.0  CL 101 104 108  CO2 24 21* 25  GLUCOSE 102* 155* 123*  BUN 20 23 25*  CREATININE 1.45* 1.60* 1.10  CALCIUM 8.6* 8.1* 8.5*  MG  --   --  2.8*   Liver Function Tests: Recent Labs  Lab 05/02/22 1901 05/03/22 0604  AST 34 42*  ALT 28 30  ALKPHOS 51 41  BILITOT 1.2 1.2  PROT 7.4 6.9  ALBUMIN 4.6 4.1   No results for input(s): "LIPASE", "AMYLASE" in the last 168 hours. No results for input(s): "AMMONIA" in the last 168 hours. CBC: Recent Labs  Lab 05/02/22 1901 05/03/22 0604 05/04/22 0600  WBC 5.7 4.8 10.9*  NEUTROABS 3.6  --   --   HGB 14.9 12.5* 14.0  HCT 45.4 39.1 43.1  MCV 87.0 89.7 87.2  PLT 162 127* 162   Cardiac Enzymes: No results for input(s): "CKTOTAL", "CKMB", "CKMBINDEX", "TROPONINI" in the last 168 hours. BNP: Invalid input(s): "POCBNP" CBG: Recent Labs  Lab 05/02/22 1841  GLUCAP 103*   D-Dimer No results for input(s): "DDIMER" in the last 72 hours. Hgb A1c No results for input(s): "HGBA1C" in the last 72 hours. Lipid Profile No results for input(s): "CHOL", "HDL", "LDLCALC", "TRIG", "CHOLHDL", "LDLDIRECT" in the last 72 hours. Thyroid function studies Recent Labs    05/03/22 0604  TSH 0.664   Anemia work up No results for input(s): "VITAMINB12", "FOLATE", "FERRITIN", "TIBC", "IRON", "RETICCTPCT" in the last 72 hours. Urinalysis    Component Value Date/Time   COLORURINE YELLOW (A) 05/03/2022 0130   APPEARANCEUR HAZY (A) 05/03/2022 0130   APPEARANCEUR Clear 10/20/2021 1557   LABSPEC >1.046 (H) 05/03/2022 0130   LABSPEC 1.002 11/05/2011 1138   PHURINE 5.0 05/03/2022 0130   GLUCOSEU NEGATIVE 05/03/2022 0130   GLUCOSEU Negative 11/05/2011 1138   HGBUR MODERATE (A) 05/03/2022 0130   BILIRUBINUR NEGATIVE 05/03/2022 0130   BILIRUBINUR  Negative 10/20/2021 1557   BILIRUBINUR Negative 11/05/2011 1138   KETONESUR 5 (A) 05/03/2022 0130   PROTEINUR NEGATIVE 05/03/2022 0130   NITRITE NEGATIVE 05/03/2022 0130   LEUKOCYTESUR NEGATIVE 05/03/2022 0130   LEUKOCYTESUR Negative 11/05/2011 1138   Sepsis Labs Recent Labs  Lab 05/02/22 1901 05/03/22 0604 05/04/22 0600  WBC 5.7 4.8 10.9*   Microbiology Recent Results (from the past 240 hour(s))  Resp panel by RT-PCR (RSV, Flu A&B, Covid) Anterior Nasal Swab     Status: None   Collection Time: 05/02/22  7:01 PM   Specimen: Anterior Nasal Swab  Result Value Ref Range Status   SARS Coronavirus 2 by RT PCR NEGATIVE NEGATIVE Final    Comment: (NOTE) SARS-CoV-2 target nucleic acids are NOT DETECTED.  The SARS-CoV-2 RNA is generally detectable in upper respiratory specimens during the acute phase of infection. The lowest concentration of SARS-CoV-2 viral copies this assay can detect is 138 copies/mL. A negative result does not preclude SARS-Cov-2 infection and should not be used as the sole basis for treatment or other patient management decisions. A negative result may occur with  improper specimen collection/handling, submission of specimen other  than nasopharyngeal swab, presence of viral mutation(s) within the areas targeted by this assay, and inadequate number of viral copies(<138 copies/mL). A negative result must be combined with clinical observations, patient history, and epidemiological information. The expected result is Negative.  Fact Sheet for Patients:  EntrepreneurPulse.com.au  Fact Sheet for Healthcare Providers:  IncredibleEmployment.be  This test is no t yet approved or cleared by the [place] FDA and  has been authorized for detection and/or diagnosis of SARS-CoV-2 by FDA under an Emergency Use Authorization (EUA). This EUA will remain  in effect (meaning this test can be used) for the duration of the COVID-19  declaration under Section 564(b)(1) of the Act, 21 U.S.C.section 360bbb-3(b)(1), unless the authorization is terminated  or revoked sooner.       Influenza A by PCR NEGATIVE NEGATIVE Final   Influenza B by PCR NEGATIVE NEGATIVE Final    Comment: (NOTE) The Xpert Xpress SARS-CoV-2/FLU/RSV plus assay is intended as an aid in the diagnosis of influenza from Nasopharyngeal swab specimens and should not be used as a sole basis for treatment. Nasal washings and aspirates are unacceptable for Xpert Xpress SARS-CoV-2/FLU/RSV testing.  Fact Sheet for Patients: EntrepreneurPulse.com.au  Fact Sheet for Healthcare Providers: IncredibleEmployment.be  This test is not yet approved or cleared by the [place] FDA and has been authorized for detection and/or diagnosis of SARS-CoV-2 by FDA under an Emergency Use Authorization (EUA). This EUA will remain in effect (meaning this test can be used) for the duration of the COVID-19 declaration under Section 564(b)(1) of the Act, 21 U.S.C. section 360bbb-3(b)(1), unless the authorization is terminated or revoked.     Resp Syncytial Virus by PCR NEGATIVE NEGATIVE Final    Comment: (NOTE) Fact Sheet for Patients: EntrepreneurPulse.com.au  Fact Sheet for Healthcare Providers: IncredibleEmployment.be  This test is not yet approved or cleared by the [place] FDA and has been authorized for detection and/or diagnosis of SARS-CoV-2 by FDA under an Emergency Use Authorization (EUA). This EUA will remain in effect (meaning this test can be used) for the duration of the COVID-19 declaration under Section 564(b)(1) of the Act, 21 U.S.C. section 360bbb-3(b)(1), unless the authorization is terminated or revoked.  Performed at [facility], [address] [place], [place] [address]   Blood culture (routine x 2)     Status: None (Preliminary result)   Collection  Time: 05/02/22  7:54 PM   Specimen: BLOOD  Result Value Ref Range Status   Specimen Description BLOOD BLOOD RIGHT ARM  Final   Special Requests   Final    BOTTLES DRAWN AEROBIC AND ANAEROBIC Blood Culture adequate volume   Culture   Final    NO GROWTH 2 DAYS Performed at [facility], [address] [place], [place] [address]    Report Status PENDING  Incomplete  Respiratory (~20 pathogens) panel by PCR     Status: Abnormal   Collection Time: 05/03/22 10:27 AM   Specimen: Nasopharyngeal Swab; Respiratory  Result Value Ref Range Status   Adenovirus NOT DETECTED NOT DETECTED Final   Coronavirus 229E NOT DETECTED NOT DETECTED Final    Comment: (NOTE) The Coronavirus on the Respiratory Panel, DOES NOT test for the novel  Coronavirus (2019 nCoV)    Coronavirus HKU1 NOT DETECTED NOT DETECTED Final   Coronavirus NL63 NOT DETECTED NOT DETECTED Final   Coronavirus OC43 NOT DETECTED NOT DETECTED Final   Metapneumovirus NOT DETECTED NOT DETECTED Final   Rhinovirus / Enterovirus NOT DETECTED NOT DETECTED Final  Influenza A H1 2009 DETECTED (A) NOT DETECTED Final   Influenza B NOT DETECTED NOT DETECTED Final   Parainfluenza Virus 1 NOT DETECTED NOT DETECTED Final   Parainfluenza Virus 2 NOT DETECTED NOT DETECTED Final   Parainfluenza Virus 3 NOT DETECTED NOT DETECTED Final   Parainfluenza Virus 4 NOT DETECTED NOT DETECTED Final   Respiratory Syncytial Virus NOT DETECTED NOT DETECTED Final   Bordetella pertussis NOT DETECTED NOT DETECTED Final   Bordetella Parapertussis NOT DETECTED NOT DETECTED Final   Chlamydophila pneumoniae NOT DETECTED NOT DETECTED Final   Mycoplasma pneumoniae NOT DETECTED NOT DETECTED Final    Comment: Performed at [facility], [place] [address], [place], [place] [address]     Total time spend on discharging this patient, including the last patient exam, discussing the hospital stay, instructions for ongoing care as it relates to all pertinent  caregivers, as well as preparing the medical discharge records, prescriptions, and/or referrals as applicable, is 45 minutes.    Enzo Bi, MD  [facility] 05/04/2022, 5:49 PM

## 2022-05-04 NOTE — ED Notes (Signed)
Patient pleasant throughout shift and cooperative with treatment. Patient resting at this time. Wife at bedside.

## 2022-05-05 ENCOUNTER — Telehealth: Payer: Self-pay | Admitting: *Deleted

## 2022-05-05 NOTE — Patient Outreach (Signed)
  Care Coordination [facility] Note Transition Care Management Follow-up Telephone Call Date of discharge and from where: [facility] [phone] How have you been since you were released from the hospital? I am doing really good Any questions or concerns? No  Items Reviewed: Did the pt receive and understand the discharge instructions provided? Yes  Medications obtained and verified? Yes  Other? No  Any new allergies since your discharge? No  Dietary orders reviewed? Yes Do you have support at home? Yes   Home Care and Equipment/Supplies: Were home health services ordered? no If so, what is the name of the agency? N   Has the agency set up a time to come to the patient's home? no Were any new equipment or medical supplies ordered?  No What is the name of the medical supply agency? n Were you able to get the supplies/equipment? not applicable Do you have any questions related to the use of the equipment or supplies? No  Functional Questionnaire: (I = Independent and D = Dependent) ADLs: I  Bathing/Dressing- I  Meal Prep- D  Eating- I  Maintaining continence- I  Transferring/Ambulation- I  Managing Meds- I  Follow up appointments reviewed:  PCP Hospital f/u appt confirmed? No  Per patient he will schedule. [facility] f/u appt confirmed? No   Are transportation arrangements needed? No  If their condition worsens, is the pt aware to call PCP or go to the Emergency Dept.? Yes Was the patient provided with contact information for the PCP's office or ED? Yes Was to pt encouraged to call back with questions or concerns? Yes  SDOH assessments and interventions completed:   Yes SDOH Interventions Today    Flowsheet Row Most Recent Value  SDOH Interventions   Food Insecurity Interventions Intervention Not Indicated  Housing Interventions Intervention Not Indicated  Transportation Interventions Intervention Not Indicated       Care Coordination Interventions:  RN discussed  foods to help boost his immune system    Encounter Outcome:  Pt. Visit Completed    [facility] [phone]

## 2022-05-07 LAB — CULTURE, BLOOD (ROUTINE X 2)
Culture: NO GROWTH
Special Requests: ADEQUATE

## 2022-05-17 DIAGNOSIS — E038 Other specified hypothyroidism: Secondary | ICD-10-CM | POA: Diagnosis not present

## 2022-05-17 DIAGNOSIS — E2749 Other adrenocortical insufficiency: Secondary | ICD-10-CM | POA: Diagnosis not present

## 2022-05-17 DIAGNOSIS — E236 Other disorders of pituitary gland: Secondary | ICD-10-CM | POA: Diagnosis not present

## 2022-05-17 DIAGNOSIS — Z87891 Personal history of nicotine dependence: Secondary | ICD-10-CM | POA: Diagnosis not present

## 2022-05-17 DIAGNOSIS — C7951 Secondary malignant neoplasm of bone: Secondary | ICD-10-CM | POA: Diagnosis not present

## 2022-05-17 DIAGNOSIS — E291 Testicular hypofunction: Secondary | ICD-10-CM | POA: Diagnosis not present

## 2022-05-17 DIAGNOSIS — C160 Malignant neoplasm of cardia: Secondary | ICD-10-CM | POA: Diagnosis not present

## 2022-05-17 DIAGNOSIS — D333 Benign neoplasm of cranial nerves: Secondary | ICD-10-CM | POA: Diagnosis not present

## 2022-05-17 DIAGNOSIS — T451X5A Adverse effect of antineoplastic and immunosuppressive drugs, initial encounter: Secondary | ICD-10-CM | POA: Diagnosis not present

## 2022-05-17 DIAGNOSIS — F411 Generalized anxiety disorder: Secondary | ICD-10-CM | POA: Diagnosis not present

## 2022-05-17 DIAGNOSIS — J9811 Atelectasis: Secondary | ICD-10-CM | POA: Diagnosis not present

## 2022-05-17 DIAGNOSIS — F419 Anxiety disorder, unspecified: Secondary | ICD-10-CM | POA: Diagnosis not present

## 2022-05-17 DIAGNOSIS — C787 Secondary malignant neoplasm of liver and intrahepatic bile duct: Secondary | ICD-10-CM | POA: Diagnosis not present

## 2022-05-17 DIAGNOSIS — R69 Illness, unspecified: Secondary | ICD-10-CM | POA: Diagnosis not present

## 2022-05-17 DIAGNOSIS — K4020 Bilateral inguinal hernia, without obstruction or gangrene, not specified as recurrent: Secondary | ICD-10-CM | POA: Diagnosis not present

## 2022-05-24 ENCOUNTER — Ambulatory Visit (INDEPENDENT_AMBULATORY_CARE_PROVIDER_SITE_OTHER): Payer: Medicare HMO | Admitting: Family Medicine

## 2022-05-24 ENCOUNTER — Encounter: Payer: Self-pay | Admitting: Family Medicine

## 2022-05-24 VITALS — BP 118/78 | HR 85 | Temp 97.5°F | Ht 68.0 in | Wt 246.2 lb

## 2022-05-24 DIAGNOSIS — R69 Illness, unspecified: Secondary | ICD-10-CM | POA: Diagnosis not present

## 2022-05-24 DIAGNOSIS — R404 Transient alteration of awareness: Secondary | ICD-10-CM

## 2022-05-24 DIAGNOSIS — F419 Anxiety disorder, unspecified: Secondary | ICD-10-CM

## 2022-05-24 DIAGNOSIS — I10 Essential (primary) hypertension: Secondary | ICD-10-CM | POA: Diagnosis not present

## 2022-05-24 DIAGNOSIS — L237 Allergic contact dermatitis due to plants, except food: Secondary | ICD-10-CM | POA: Diagnosis not present

## 2022-05-24 MED ORDER — PREDNISONE 10 MG PO TABS: ORAL_TABLET | ORAL | 0 refills | Status: AC

## 2022-05-24 NOTE — Assessment & Plan Note (Signed)
Rash is presumably from poison ivy.  Given that it is located on his face and genitals we will treat with prednisone.  He will take prednisone 40 mg daily for 3 days and then decrease to 30 mg daily for 3 days and then decrease to 20 mg daily for 3 days and then decrease to 10 mg daily for 3 days.  He will subsequently resume his home hydrocortisone dosing.  He was advised to seek medical attention immediately if he develops pain or worsening swelling in his penis.

## 2022-05-24 NOTE — Assessment & Plan Note (Addendum)
Chronic issue.  Likely elevated today related to discomfort from his poison ivy.  Blood pressure improved on recheck.  Discussed checking consistently at home.  He will continue amlodipine 5 mg daily.

## 2022-05-24 NOTE — Assessment & Plan Note (Signed)
Resolved.  This was related to adrenal crisis from his influenza.

## 2022-05-24 NOTE — Patient Instructions (Signed)
Nice to see you. I sent in prednisone for you to start on for the poison ivy.  If you have any worsening symptoms please let us know.  If you have pain or develop worsening swelling in your penis please be reevaluated right away.

## 2022-05-24 NOTE — Progress Notes (Signed)
Jose Rumps, MD Phone: [phone]  Jose Hayes is a 62 y.o. male who presents today for f/u.  HYPERTENSION Disease Monitoring Home BP Monitoring not checking Chest pain- no    Dyspnea- no Medications Compliance-  taking amlodipine.  BMET    Component Value Date/Time   NA 142 05/04/2022 0600   NA 142 11/06/2011 0427   K 4.0 05/04/2022 0600   K 4.1 11/06/2011 0427   CL 108 05/04/2022 0600   CL 107 11/06/2011 0427   CO2 25 05/04/2022 0600   CO2 29 11/06/2011 0427   GLUCOSE 123 (H) 05/04/2022 0600   GLUCOSE 93 11/06/2011 0427   BUN 25 (H) 05/04/2022 0600   BUN 17 11/06/2011 0427   CREATININE 1.10 05/04/2022 0600   CREATININE 1.04 12/19/2020 1518   CALCIUM 8.5 (L) 05/04/2022 0600   CALCIUM 8.5 11/06/2011 0427   GFRNONAA >60 05/04/2022 0600   GFRNONAA >60 11/06/2011 0427   GFRAA >60 11/06/2011 0427   Influenza/altered mental status/adrenal crisis: Patient ended up with respiratory issues and was found to have influenza.  He was significantly altered and notes he cannot remember any of what happened early on in his hospitalization.  He was found to be in adrenal crisis related to his influenza.  His altered mental status improved with stress dose steroids.  His fever resolved.  He feels as though he is back to his baseline from this issue.  Anxiety: Patient notes Cymbalta has been helpful.  He did have to reduce the dose given that the 60 mg dose caused sexual dysfunction and constipation.  Poison ivy: Patient reports rash on his face and genitals and subsequently on his left arm around his antecubital fossa.  He notes he was exposed to dormant poison ivy on Saturday and subsequently the rash started.  He notes no medication changes.  No soap or detergent changes.  No contacts with similar symptoms.  Social History   Tobacco Use  Smoking Status Former   Packs/day: 0.50   Years: 25.00   Total pack years: 12.50   Types: Cigarettes  Smokeless Tobacco Never   Tobacco Comments   trying gum to quit    Current Outpatient Medications on File Prior to Visit  Medication Sig Dispense Refill   ALPRAZolam (XANAX) 1 MG tablet Take 1 mg by mouth 3 (three) times daily as needed for anxiety.     amLODipine (NORVASC) 5 MG tablet Hold until followup with outpatient provider due to low blood pressure. 90 tablet 3   DULoxetine (CYMBALTA) 30 MG capsule Take 30 mg by mouth 2 (two) times daily.     gabapentin (NEURONTIN) 100 MG capsule Take 100 mg by mouth 3 (three) times daily.     hydrocortisone (CORTEF) 10 MG tablet Take 10-15 mg by mouth See admin instructions. Take 1.5 tablets (15 mg) by mouth in the morning (1 hour after levothyroxine) & take 1 tablet (10 mg) by mouth in the afternoon at 2 pm.     hydrocortisone sodium succinate (SOLU-CORTEF) 100 MG injection Inject 2 mLs (100 mg total) into the muscle as needed. For adrenal crisis. 1 each 1   hydrOXYzine (ATARAX) 10 MG tablet Take 10 mg by mouth 3 (three) times daily as needed.     levothyroxine (SYNTHROID) 88 MCG tablet Take 88 mcg by mouth daily before breakfast.     rosuvastatin (CRESTOR) 20 MG tablet Take 20 mg by mouth daily.     testosterone cypionate (DEPOTESTOSTERONE CYPIONATE) 200 MG/ML injection Inject into the  muscle every Monday. 0.46ms     No current facility-administered medications on file prior to visit.     ROS see history of present illness  Objective  Physical Exam Vitals:   05/24/22 1109 05/24/22 1128  BP: (!) 140/80 118/78  Pulse: 85   Temp: (!) 97.5 F (36.4 C)   SpO2: 97%     BP Readings from Last 3 Encounters:  05/24/22 118/78  05/04/22 128/85  12/22/21 125/78   Wt Readings from Last 3 Encounters:  05/24/22 246 lb 3.2 oz (111.7 kg)  05/03/22 248 lb 7.3 oz (112.7 kg)  12/22/21 240 lb (108.9 kg)    Physical Exam Constitutional:      General: He is not in acute distress.    Appearance: He is not diaphoretic.  HENT:     Head:   Cardiovascular:     Rate and  Rhythm: Normal rate and regular rhythm.     Heart sounds: Normal heart sounds.  Pulmonary:     Effort: Pulmonary effort is normal.     Breath sounds: Normal breath sounds.  Genitourinary:    Comments: Circumcised penis, area of swelling and erythema on the distal shaft of the penis Skin:    General: Skin is warm and dry.     Comments: Erythematous papular rash around his left antecubital fossa  Neurological:     Mental Status: He is alert.      Assessment/Plan: Please see individual problem list.  Primary hypertension Assessment & Plan: Chronic issue.  Likely elevated today related to discomfort from his poison ivy.  Blood pressure improved on recheck.  Discussed checking consistently at home.  He will continue amlodipine 5 mg daily.   Anxiety Assessment & Plan: Chronic issue.  He will continue Cymbalta 30 mg daily.  I discussed that it would be okay for him to take his Xanax as prescribed while on the steroids if he has increased anxiety from these.  Advised to monitor for drowsiness with taking his Xanax.   Transient alteration of awareness Assessment & Plan: Resolved.  This was related to adrenal crisis from his influenza.   Poison ivy Assessment & Plan: Rash is presumably from poison ivy.  Given that it is located on his face and genitals we will treat with prednisone.  He will take prednisone 40 mg daily for 3 days and then decrease to 30 mg daily for 3 days and then decrease to 20 mg daily for 3 days and then decrease to 10 mg daily for 3 days.  He will subsequently resume his home hydrocortisone dosing.  He was advised to seek medical attention immediately if he develops pain or worsening swelling in his penis.  Orders: -     predniSONE; Take 4 tablets (40 mg total) by mouth daily with breakfast for 3 days, THEN 3 tablets (30 mg total) daily with breakfast for 3 days, THEN 2 tablets (20 mg total) daily with breakfast for 3 days, THEN 1 tablet (10 mg total) daily with  breakfast for 3 days.  Dispense: 30 tablet; Refill: 0     Return in about 6 months (around 11/22/2022) for Hypertension.   Jose Rumps MD Jose Hayes

## 2022-05-24 NOTE — Assessment & Plan Note (Addendum)
Chronic issue.  He will continue Cymbalta 30 mg daily.  I discussed that it would be okay for him to take his Xanax as prescribed while on the steroids if he has increased anxiety from these.  Advised to monitor for drowsiness with taking his Xanax.

## 2022-05-24 NOTE — Assessment & Plan Note (Signed)
>>  ASSESSMENT AND PLAN FOR ANXIETY WRITTEN ON 05/24/2022 11:30 AM BY SONNENBERG, ERIC G, MD  Chronic issue.  He will continue Cymbalta  30 mg daily.  I discussed that it would be okay for him to take his Xanax  as prescribed while on the steroids if he has increased anxiety from these.  Advised to monitor for drowsiness with taking his Xanax .

## 2022-05-25 DIAGNOSIS — K4020 Bilateral inguinal hernia, without obstruction or gangrene, not specified as recurrent: Secondary | ICD-10-CM | POA: Diagnosis not present

## 2022-07-08 DIAGNOSIS — I861 Scrotal varices: Secondary | ICD-10-CM | POA: Diagnosis not present

## 2022-07-08 DIAGNOSIS — K4020 Bilateral inguinal hernia, without obstruction or gangrene, not specified as recurrent: Secondary | ICD-10-CM | POA: Diagnosis not present

## 2022-07-13 DIAGNOSIS — K4090 Unilateral inguinal hernia, without obstruction or gangrene, not specified as recurrent: Secondary | ICD-10-CM | POA: Diagnosis not present

## 2022-08-03 ENCOUNTER — Encounter: Payer: Self-pay | Admitting: Internal Medicine

## 2022-08-03 ENCOUNTER — Ambulatory Visit (INDEPENDENT_AMBULATORY_CARE_PROVIDER_SITE_OTHER): Payer: Medicare HMO | Admitting: Internal Medicine

## 2022-08-03 VITALS — BP 138/88 | HR 76 | Temp 98.0°F | Ht 68.0 in | Wt 257.0 lb

## 2022-08-03 DIAGNOSIS — J0140 Acute pansinusitis, unspecified: Secondary | ICD-10-CM | POA: Insufficient documentation

## 2022-08-03 MED ORDER — AMOXICILLIN-POT CLAVULANATE 875-125 MG PO TABS: 1.0000 | ORAL_TABLET | Freq: Two times a day (BID) | ORAL | 1 refills | Status: DC

## 2022-08-03 NOTE — Assessment & Plan Note (Signed)
Ongoing symptoms for months No adrenal crisis--can hold off on rescue cortef Augmentin 875 bid x 7 days (but with refill in case takes longer)

## 2022-08-03 NOTE — Progress Notes (Signed)
Subjective:    Patient ID: Jose Hayes, male    DOB: 10-19-1960, 62 y.o.   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     amLODipine (NORVASC) 5 MG tablet Hold until followup with outpatient provider due to low blood pressure. 90 tablet 3   gabapentin (NEURONTIN) 100 MG capsule Take 100 mg by mouth 3 (three) times daily.     hydrocortisone (CORTEF) 10 MG tablet Take 10-15 mg by mouth See admin instructions. Take 1.5 tablets (15 mg) by mouth in the morning (1 hour after levothyroxine) & take 1 tablet (10 mg) by mouth in the afternoon at 2 pm.     hydrocortisone sodium succinate (SOLU-CORTEF) 100 MG injection Inject 2 mLs (100 mg total) into the muscle as needed. For adrenal crisis. 1 each 1   hydrOXYzine (ATARAX) 10 MG tablet Take 10 mg by mouth 3 (three) times daily as needed.     levothyroxine (SYNTHROID) 88 MCG tablet Take 88 mcg by mouth daily before breakfast.     rosuvastatin (CRESTOR) 20 MG tablet Take 20 mg by mouth daily.     testosterone cypionate (DEPOTESTOSTERONE CYPIONATE) 200 MG/ML injection Inject into the muscle every Monday. 0.96mls     No current facility-administered medications on file prior to visit.    No Known Allergies  Past  Medical History:  Diagnosis Date   Adrenal insufficiency (Addison's disease) ([place])    Allergy    Anxiety    COVID-19    1/14, 05/24/20   Esophageal cancer ([place]) 10/21/2016   GE junction   GERD (gastroesophageal reflux disease)    Hyperglycemia    Hyperlipidemia    Hypertension    Left acoustic neuroma ([place])    Sleep apnea    CPAP   SVT (supraventricular tachycardia)    2013   Thyroid disease    Tobacco abuse     Past Surgical History:  Procedure Laterality Date   COLONOSCOPY     KNEE ARTHROSCOPY Right 07/30/2021   Procedure: right knee arthroscopy, debridement, removal loose body;  Surgeon: Meredith Pel, MD;  Location: [facility];  Service: Orthopedics;  Laterality: Right;   LAPAROSCOPIC CHOLECYSTECTOMY  1991   SHOULDER SURGERY Right 1985, 1991, 2000   left x3   [place] teeth extraction     UPPER GASTROINTESTINAL ENDOSCOPY      Family History  Problem Relation Age of Onset   Hypertension Mother    Hypertension Father    Pancreatic cancer Father    Clotting disorder Father        renal cell ca   Colon cancer Neg Hx    Esophageal cancer Neg Hx    Prostate cancer Neg Hx    Rectal cancer Neg Hx    Stomach cancer Neg Hx  Colon polyps Neg Hx     Social History   Socioeconomic History   Marital status: Married    Spouse name: Not on file   Number of children: Not on file   Years of education: Not on file   Highest education level: Not on file  Occupational History   Not on file  Tobacco Use   Smoking status: Former    Packs/day: 0.50    Years: 25.00    Additional pack years: 0.00    Total pack years: 12.50    Types: Cigarettes   Smokeless tobacco: Never   Tobacco comments:    trying gum to quit  Vaping Use   Vaping Use: Former  Substance and Sexual Activity   Alcohol use: Not Currently    Comment: occasionally   Drug use: No   Sexual activity: Not on file  Other Topics Concern   Not on file  Social History Narrative   Not on file   Social  Determinants of Health   Financial Resource Strain: Low Risk  (06/26/2021)   Overall Financial Resource Strain (CARDIA)    Difficulty of Paying Living Expenses: Not hard at all  Food Insecurity: No Food Insecurity (05/05/2022)   Hunger Vital Sign    Worried About Running Out of Food in the Last Year: Never true    Ran Out of Food in the Last Year: Never true  Transportation Needs: No Transportation Needs (05/05/2022)   PRAPARE - Hydrologist (Medical): No    Lack of Transportation (Non-Medical): No  Physical Activity: Not on file  Stress: No Stress Concern Present (06/26/2021)   [place]    Feeling of Stress : Only a little  Social Connections: Unknown (06/26/2021)   Social Connection and Isolation Panel [NHANES]    Frequency of Communication with Friends and Family: Not on file    Frequency of Social Gatherings with Friends and Family: Not on file    Attends Religious Services: Not on file    Active Member of Clubs or Organizations: Not on file    Attends Archivist Meetings: Not on file    Marital Status: Married  Intimate Partner Violence: Not At Risk (06/26/2021)   Humiliation, Afraid, Rape, and Kick questionnaire    Fear of Current or Ex-Partner: No    Emotionally Abused: No    Physically Abused: No    Sexually Abused: No   Review of Systems Not sleeping well--even with CPAP.  No N/V Eating okay    Objective:   Physical Exam Constitutional:      Appearance: Normal appearance.  HENT:     Head:     Comments: Maxillary >> frontal tenderness    Right Ear: Tympanic membrane and ear canal normal.     Left Ear: Tympanic membrane and ear canal normal.     Mouth/Throat:     Pharynx: No oropharyngeal exudate or posterior oropharyngeal erythema.  Pulmonary:     Effort: Pulmonary effort is normal.     Breath sounds: Normal breath sounds. No wheezing or rales.   Musculoskeletal:     Cervical back: Neck supple.  Lymphadenopathy:     Cervical: No cervical adenopathy.  Neurological:     Mental Status: He is alert.            Assessment & Plan:

## 2022-08-16 DIAGNOSIS — F331 Major depressive disorder, recurrent, moderate: Secondary | ICD-10-CM | POA: Diagnosis not present

## 2022-08-16 DIAGNOSIS — F411 Generalized anxiety disorder: Secondary | ICD-10-CM | POA: Diagnosis not present

## 2022-08-16 DIAGNOSIS — E291 Testicular hypofunction: Secondary | ICD-10-CM | POA: Diagnosis not present

## 2022-08-18 ENCOUNTER — Other Ambulatory Visit: Payer: Self-pay | Admitting: Physician Assistant

## 2022-08-18 NOTE — Telephone Encounter (Signed)
Please contact patient for overdue 12 month f/u, last seen by Dr Mariah Milling 07-14-21.  Refills pending appt.

## 2022-08-18 NOTE — Telephone Encounter (Signed)
Pt scheduled on 7/22 for [facility] Only.

## 2022-08-18 NOTE — Telephone Encounter (Signed)
PCP noted Amlodipine(Sig: Hold until followup with outpatient provider due to low blood pressure). Please advise if ok to refill. Please route to MA pool or RN. Thank you

## 2022-08-23 NOTE — Telephone Encounter (Signed)
Pt has appt 11/29/22 with [facility]. Has medication, no rf needed

## 2022-08-25 ENCOUNTER — Other Ambulatory Visit: Payer: Self-pay | Admitting: Internal Medicine

## 2022-08-30 DIAGNOSIS — R3912 Poor urinary stream: Secondary | ICD-10-CM | POA: Diagnosis not present

## 2022-08-30 DIAGNOSIS — N401 Enlarged prostate with lower urinary tract symptoms: Secondary | ICD-10-CM | POA: Diagnosis not present

## 2022-08-30 DIAGNOSIS — N50819 Testicular pain, unspecified: Secondary | ICD-10-CM | POA: Diagnosis not present

## 2022-08-30 DIAGNOSIS — N529 Male erectile dysfunction, unspecified: Secondary | ICD-10-CM | POA: Diagnosis not present

## 2022-08-30 DIAGNOSIS — N50812 Left testicular pain: Secondary | ICD-10-CM | POA: Diagnosis not present

## 2022-09-09 DIAGNOSIS — R3129 Other microscopic hematuria: Secondary | ICD-10-CM | POA: Diagnosis not present

## 2022-09-14 DIAGNOSIS — R3129 Other microscopic hematuria: Secondary | ICD-10-CM | POA: Diagnosis not present

## 2022-09-14 DIAGNOSIS — N200 Calculus of kidney: Secondary | ICD-10-CM | POA: Diagnosis not present

## 2022-09-14 DIAGNOSIS — K769 Liver disease, unspecified: Secondary | ICD-10-CM | POA: Diagnosis not present

## 2022-09-21 ENCOUNTER — Telehealth: Payer: Self-pay | Admitting: Family Medicine

## 2022-09-21 NOTE — Telephone Encounter (Signed)
Contacted Jose Hayes to schedule their annual wellness visit. Appointment made for 11/02/2022.  Verlee Rossetti; Care Guide Ambulatory Clinical Support [place] [facility] Direct Dial: [phone]

## 2022-09-29 ENCOUNTER — Telehealth: Payer: Self-pay | Admitting: Cardiovascular Disease

## 2022-09-29 MED ORDER — AMLODIPINE BESYLATE 5 MG PO TABS: ORAL_TABLET | ORAL | 3 refills | Status: DC

## 2022-09-29 NOTE — Telephone Encounter (Signed)
*  STAT* If patient is at the pharmacy, call can be transferred to refill team.   1. Which medications need to be refilled? (please list name of each medication and dose if known) amLODipine (NORVASC) 5 MG tablet   2. Which pharmacy/location (including street and city if local pharmacy) is medication to be sent to? [facility] - [place], [place] - [address]   3. Do they need a 30 day or 90 day supply? 90 day

## 2022-09-30 DIAGNOSIS — R3129 Other microscopic hematuria: Secondary | ICD-10-CM | POA: Diagnosis not present

## 2022-09-30 DIAGNOSIS — N200 Calculus of kidney: Secondary | ICD-10-CM | POA: Diagnosis not present

## 2022-09-30 DIAGNOSIS — N401 Enlarged prostate with lower urinary tract symptoms: Secondary | ICD-10-CM | POA: Diagnosis not present

## 2022-09-30 DIAGNOSIS — Z79890 Hormone replacement therapy (postmenopausal): Secondary | ICD-10-CM | POA: Diagnosis not present

## 2022-09-30 DIAGNOSIS — N529 Male erectile dysfunction, unspecified: Secondary | ICD-10-CM | POA: Diagnosis not present

## 2022-09-30 DIAGNOSIS — Z87891 Personal history of nicotine dependence: Secondary | ICD-10-CM | POA: Diagnosis not present

## 2022-09-30 DIAGNOSIS — N50819 Testicular pain, unspecified: Secondary | ICD-10-CM | POA: Diagnosis not present

## 2022-09-30 DIAGNOSIS — Z9049 Acquired absence of other specified parts of digestive tract: Secondary | ICD-10-CM | POA: Diagnosis not present

## 2022-09-30 DIAGNOSIS — R3912 Poor urinary stream: Secondary | ICD-10-CM | POA: Diagnosis not present

## 2022-09-30 DIAGNOSIS — N50812 Left testicular pain: Secondary | ICD-10-CM | POA: Diagnosis not present

## 2022-09-30 DIAGNOSIS — Z79899 Other long term (current) drug therapy: Secondary | ICD-10-CM | POA: Diagnosis not present

## 2022-10-26 DIAGNOSIS — F411 Generalized anxiety disorder: Secondary | ICD-10-CM | POA: Diagnosis not present

## 2022-11-02 ENCOUNTER — Ambulatory Visit (INDEPENDENT_AMBULATORY_CARE_PROVIDER_SITE_OTHER): Payer: Medicare HMO | Admitting: *Deleted

## 2022-11-02 VITALS — Ht 68.0 in | Wt 255.0 lb

## 2022-11-02 DIAGNOSIS — Z0000 Encounter for general adult medical examination without abnormal findings: Secondary | ICD-10-CM

## 2022-11-02 NOTE — Progress Notes (Signed)
Subjective:   Jose Hayes is a 62 y.o. male who presents for Medicare Annual/Subsequent preventive examination.  Visit Complete: Virtual  I connected with  Jose Hayes on 11/02/22 by a video and audio enabled telemedicine application and verified that I am speaking with the correct person using two identifiers.  Patient Location: Home  Provider Location: Office/Clinic  I discussed the limitations of evaluation and management by telemedicine. The patient expressed understanding and agreed to proceed.   Review of Systems     Cardiac Risk Factors include: advanced age (>34men, >85 women);dyslipidemia;obesity (BMI >30kg/m2);male gender;hypertension;Other (see comment), Risk factor comments: SVT     Objective:    Today's Vitals   11/02/22 1023 11/02/22 1028  Weight: 255 lb (115.7 kg)   Height: 5\' 8"  (1.727 m)   PainSc:  5    Body mass index is 38.77 kg/m.     11/02/2022   10:59 AM 07/30/2021   12:13 PM 06/26/2021   10:25 AM 05/22/2020   12:02 PM 11/02/2016    9:35 AM 10/27/2016    3:10 PM 02/10/2015    6:00 PM  Advanced Directives  Does Patient Have a Medical Advance Directive? No No No Yes No No No  Does patient want to make changes to medical advance directive?    No - Patient declined     Would patient like information on creating a medical advance directive?  No - Patient declined No - Patient declined    No - patient declined information    Current Medications (verified) Outpatient Encounter Medications as of 11/02/2022  Medication Sig   ALPRAZolam (XANAX) 1 MG tablet Take 1 mg by mouth 3 (three) times daily as needed for anxiety.   amLODipine (NORVASC) 5 MG tablet Hold until followup with outpatient provider due to low blood pressure.   busPIRone (BUSPAR) 10 MG tablet Take 5 mg by mouth 3 (three) times daily.   hydrocortisone (CORTEF) 10 MG tablet Take 10-15 mg by mouth See admin instructions. Take 1.5 tablets (15 mg) by mouth in the morning (1 hour after  levothyroxine) & take 1 tablet (10 mg) by mouth in the afternoon at 2 pm.   hydrocortisone sodium succinate (SOLU-CORTEF) 100 MG injection Inject 2 mLs (100 mg total) into the muscle as needed. For adrenal crisis.   levothyroxine (SYNTHROID) 88 MCG tablet Take 88 mcg by mouth daily before breakfast.   rosuvastatin (CRESTOR) 20 MG tablet Take 20 mg by mouth daily.   testosterone cypionate (DEPOTESTOSTERONE CYPIONATE) 200 MG/ML injection Inject into the muscle every Monday. 0.42mls   gabapentin (NEURONTIN) 100 MG capsule Take 100 mg by mouth 3 (three) times daily. (Patient not taking: Reported on 11/02/2022)   hydrOXYzine (ATARAX) 10 MG tablet Take 10 mg by mouth 3 (three) times daily as needed. (Patient not taking: Reported on 11/02/2022)   [DISCONTINUED] amoxicillin-clavulanate (AUGMENTIN) 875-125 MG tablet Take 1 tablet by mouth 2 (two) times daily. (Patient not taking: Reported on 11/02/2022)   No facility-administered encounter medications on file as of 11/02/2022.    Allergies (verified) Patient has no known allergies.   History: Past Medical History:  Diagnosis Date   Adrenal insufficiency (Addison's disease) (HCC)    Allergy    Anxiety    COVID-19    1/14, 05/24/20   Esophageal cancer (HCC) 10/21/2016   GE junction   GERD (gastroesophageal reflux disease)    Hyperglycemia    Hyperlipidemia    Hypertension    Left acoustic neuroma (HCC)  Sleep apnea    CPAP   SVT (supraventricular tachycardia)    2013   Thyroid disease    Tobacco abuse    Past Surgical History:  Procedure Laterality Date   COLONOSCOPY     KNEE ARTHROSCOPY Right 07/30/2021   Procedure: right knee arthroscopy, debridement, removal loose body;  Surgeon: Cammy Copa, MD;  Location: [facility] OR;  Service: Orthopedics;  Laterality: Right;   LAPAROSCOPIC CHOLECYSTECTOMY  1991   SHOULDER SURGERY Right 1985, 1991, 2000   left x3   [place] teeth extraction     UPPER GASTROINTESTINAL ENDOSCOPY     Family  History  Problem Relation Age of Onset   Hypertension Mother    Hypertension Father    Pancreatic cancer Father    Clotting disorder Father        renal cell ca   Colon cancer Neg Hx    Esophageal cancer Neg Hx    Prostate cancer Neg Hx    Rectal cancer Neg Hx    Stomach cancer Neg Hx    Colon polyps Neg Hx    Social History   Socioeconomic History   Marital status: Married    Spouse name: Not on file   Number of children: Not on file   Years of education: Not on file   Highest education level: Not on file  Occupational History   Not on file  Tobacco Use   Smoking status: Former    Packs/day: 0.50    Years: 25.00    Additional pack years: 0.00    Total pack years: 12.50    Types: Cigarettes   Smokeless tobacco: Never   Tobacco comments:    trying gum to quit  Vaping Use   Vaping Use: Former  Substance and Sexual Activity   Alcohol use: Not Currently    Comment: occasionally   Drug use: No   Sexual activity: Not on file  Other Topics Concern   Not on file  Social History Narrative   Not on file   Social Determinants of Health   Financial Resource Strain: Low Risk  (11/02/2022)   Overall Financial Resource Strain (CARDIA)    Difficulty of Paying Living Expenses: Not hard at all  Food Insecurity: No Food Insecurity (11/02/2022)   Hunger Vital Sign    Worried About Running Out of Food in the Last Year: Never true    Ran Out of Food in the Last Year: Never true  Transportation Needs: No Transportation Needs (11/02/2022)   PRAPARE - Administrator, Civil Service (Medical): No    Lack of Transportation (Non-Medical): No  Physical Activity: Insufficiently Active (11/02/2022)   Exercise Vital Sign    Days of Exercise per Week: 4 days    Minutes of Exercise per Session: 20 min  Stress: Stress Concern Present (11/02/2022)   [employer] of Occupational Health - Occupational Stress Questionnaire    Feeling of Stress : Rather much  Social Connections:  Moderately Integrated (11/02/2022)   Social Connection and Isolation Panel [NHANES]    Frequency of Communication with Friends and Family: More than three times a week    Frequency of Social Gatherings with Friends and Family: More than three times a week    Attends Religious Services: More than 4 times per year    Active Member of [employer] or Organizations: No    Attends Banker Meetings: Never    Marital Status: Married    Tobacco Counseling Counseling given: Not Answered  Tobacco comments: trying gum to quit   Clinical Intake:  Pre-visit preparation completed: Yes  Pain : 0-10 Pain Score: 5  Pain Type: Chronic pain Pain Location: Abdomen Pain Orientation: Left Pain Descriptors / Indicators: Sharp, Dull Pain Onset: More than a month ago Pain Frequency: Intermittent     BMI - recorded: 37.43 Nutritional Risks: None Diabetes: No  How often do you need to have someone help you when you read instructions, pamphlets, or other written materials from your doctor or pharmacy?: 1 - Never  Interpreter Needed?: No  Information entered by :: R. Fernandez Kenley LPN   Activities of Daily Living    11/02/2022   10:45 AM  In your present state of health, do you have any difficulty performing the following activities:  Hearing? 1  Vision? 0  Difficulty concentrating or making decisions? 0  Walking or climbing stairs? 0  Dressing or bathing? 0  Doing errands, shopping? 0  Preparing Food and eating ? N  Using the Toilet? N  In the past six months, have you accidently leaked urine? N  Do you have problems with loss of bowel control? N  Managing your Medications? N  Managing your Finances? N  Housekeeping or managing your Housekeeping? N    Patient Care Team: Glori Luis, MD as PCP - General (Family Medicine) Mariah Milling [name] Pizza, MD as PCP - Cardiology (Cardiology) Antonieta Iba, MD as Consulting Physician (Cardiology) Excell Seltzer, Silas Flood, MD as Referring  Physician (Urology) Lance Bosch, MD as Referring Physician (Internal Medicine) Tiffany Kocher, Charise Killian, MD as Referring Physician (Endocrinology) Noel Gerold, Lajuana Ripple, NP as Nurse Practitioner (Psychiatry) Margarito Courser, MD (Otolaryngology)  Indicate any recent Medical Services you may have received from other than [facility] providers in the past year (date may be approximate).     Assessment:   This is a routine wellness examination for [place].  Hearing/Vision screen Hearing Screening - Comments:: Issues hearing Vision Screening - Comments:: No issues  Dietary issues and exercise activities discussed:     Goals Addressed             This Visit's Progress    Patient Stated       Wants to better control his medical conditions to minimize anxiety, joint pain and fatigue. Work on weight loss.       Depression Screen    11/02/2022   11:03 AM 11/02/2022   10:51 AM 05/24/2022   11:10 AM 12/22/2021   10:10 AM 10/14/2021   11:13 AM 07/01/2021   11:18 AM 06/26/2021    9:55 AM  PHQ 2/9 Scores  PHQ - 2 Score 0 0 0 1 0 0   PHQ- 9 Score 4 4       Exception Documentation       Other- indicate reason in comment box  Not completed       Followed by Psychiatry every one month    Fall Risk    11/02/2022   10:48 AM 12/22/2021   10:09 AM 10/14/2021   11:13 AM 07/01/2021   11:18 AM 06/26/2021   10:26 AM  Fall Risk   Falls in the past year? 0 0 0 0 0  Number falls in past yr: 0 0 0 0 0  Injury with Fall? 0 0 0 1   Risk for fall due to : No Fall Risks No Fall Risks No Fall Risks History of fall(s)   Follow up Falls prevention discussed;Falls evaluation completed;Education provided Falls evaluation completed Falls evaluation  completed Falls evaluation completed Falls evaluation completed    MEDICARE RISK AT HOME:  Medicare Risk at Home - 11/02/22 1049     Any stairs in or around the home? Yes    If so, are there any without handrails? Yes    Home free of loose throw rugs in walkways, pet  beds, electrical cords, etc? Yes    Adequate lighting in your home to reduce risk of falls? Yes    Life alert? No    Use of a cane, walker or w/c? No    Grab bars in the bathroom? Yes    Shower chair or bench in shower? No    Elevated toilet seat or a handicapped toilet? No                    11/02/2022   11:01 AM  6CIT Screen  What Year? 0 points  What month? 0 points  What time? 0 points  Count back from 20 0 points  Months in reverse 0 points  Repeat phrase 0 points  Total Score 0 points    Immunizations Immunization History  Administered Date(s) Administered   Influenza,inj,Quad PF,6+ Mos 02/03/2017, 02/13/2018, 01/11/2019   Influenza-Unspecified 04/09/2020   PFIZER(Purple Top)SARS-COV-2 Vaccination 08/06/2019, 08/27/2019   Tetanus 03/16/2021    TDAP status: Up to date  Flu Vaccine status: Due, Education has been provided regarding the importance of this vaccine. Advised may receive this vaccine at local pharmacy or Health Dept. Aware to provide a copy of the vaccination record if obtained from local pharmacy or Health Dept. Verbalized acceptance and understanding.  Pneumococcal vaccine status: Due, Education has been provided regarding the importance of this vaccine. Advised may receive this vaccine at local pharmacy or Health Dept. Aware to provide a copy of the vaccination record if obtained from local pharmacy or Health Dept. Verbalized acceptance and understanding.  Covid-19 vaccine status: Information provided on how to obtain vaccines.   Qualifies for Shingles Vaccine? Yes   Zostavax completed No   Shingrix Completed?: No.    Education has been provided regarding the importance of this vaccine. Patient has been advised to call insurance company to determine out of pocket expense if they have not yet received this vaccine. Advised may also receive vaccine at local pharmacy or Health Dept. Verbalized acceptance and understanding.  Screening Tests Health  Maintenance  Topic Date Due   Hepatitis C Screening  Never done   Zoster Vaccines- Shingrix (1 of 2) Never done   COVID-19 Vaccine (3 - Pfizer risk series) 09/24/2019   DTaP/Tdap/Td (1 - Tdap) 03/17/2021   Medicare Annual Wellness (AWV)  06/26/2022   INFLUENZA VACCINE  12/09/2022   Colonoscopy  08/14/2023   HIV Screening  Completed   HPV VACCINES  Aged Out    Health Maintenance  Health Maintenance Due  Topic Date Due   Hepatitis C Screening  Never done   Zoster Vaccines- Shingrix (1 of 2) Never done   COVID-19 Vaccine (3 - Pfizer risk series) 09/24/2019   DTaP/Tdap/Td (1 - Tdap) 03/17/2021   Medicare Annual Wellness (AWV)  06/26/2022    Colorectal cancer screening: Type of screening: Colonoscopy. Completed 4/22. Repeat every 3 years  Lung Cancer Screening: (Low Dose CT Chest recommended if Age 61-80 years, 20 pack-year currently smoking OR have quit w/in 15years.) does not qualify.     Additional Screening:  Hepatitis C Screening: does not qualify; Completed due at next lab  Vision Screening: Recommended annual ophthalmology  exams for early detection of glaucoma and other disorders of the eye. Is the patient up to date with their annual eye exam?  No  Who is the provider or what is the name of the office in which the patient attends annual eye exams? Has been several years since eye exam, will talk with PCP when he comes in for visit If pt is not established with a provider, would they like to be referred to a provider to establish care? No .   Dental Screening: Recommended annual dental exams for proper oral hygiene   Community Resource Referral / Chronic Care Management: CRR required this visit?  No   CCM required this visit?  No     Plan:     I have personally reviewed and noted the following in the patient's chart:   Medical and social history Use of alcohol, tobacco or illicit drugs  Current medications and supplements including opioid prescriptions.  Patient is not currently taking opioid prescriptions. Functional ability and status Nutritional status Physical activity Advanced directives List of other physicians Hospitalizations, surgeries, and ER visits in previous 12 months Vitals Screenings to include cognitive, depression, and falls Referrals and appointments  In addition, I have reviewed and discussed with patient certain preventive protocols, quality metrics, and best practice recommendations. A written personalized care plan for preventive services as well as general preventive health recommendations were provided to patient.     Sydell Axon, LPN   2/84/1324   After Visit Summary: (MyChart) Due to this being a telephonic visit, the after visit summary with patients personalized plan was offered to patient via MyChart   Nurse Notes: Patient is due Hep C labs. Patient will discuss updating his vaccines when he comes in. Wants to discuss referrals to other specialist.

## 2022-11-02 NOTE — Patient Instructions (Signed)
Jose Hayes , Thank you for taking time to come for your Medicare Wellness Visit. I appreciate your ongoing commitment to your health goals. Please review the following plan we discussed and let me know if I can assist you in the future.   These are the goals we discussed:  Goals      Follow up with pcp     As needed     Patient Stated     Wants to better control his medical conditions to minimize anxiety, joint pain and fatigue. Work on weight loss.        This is a list of the screening recommended for you and due dates:  Health Maintenance  Topic Date Due   Hepatitis C Screening  Never done   Zoster (Shingles) Vaccine (1 of 2) Never done   COVID-19 Vaccine (3 - Pfizer risk series) 09/24/2019   DTaP/Tdap/Td vaccine (1 - Tdap) 03/17/2021   Medicare Annual Wellness Visit  06/26/2022   Flu Shot  12/09/2022   Colon Cancer Screening  08/14/2023   HIV Screening  Completed   HPV Vaccine  Aged Out    Advanced directives: none, patient will get paperwork at next visit  Conditions/risks identified: none  Next appointment: Follow up in one year for your annual wellness visit. 6/30/ @ 10:30 telephone  Preventive Care 65 Years and Older, Male  Preventive care refers to lifestyle choices and visits with your health care provider that can promote health and wellness. What does preventive care include? A yearly physical exam. This is also called an annual well check. Dental exams once or twice a year. Routine eye exams. Ask your health care provider how often you should have your eyes checked. Personal lifestyle choices, including: Daily care of your teeth and gums. Regular physical activity. Eating a healthy diet. Avoiding tobacco and drug use. Limiting alcohol use. Practicing safe sex. Taking low doses of aspirin every day. Taking vitamin and mineral supplements as recommended by your health care provider. What happens during an annual well check? The services and screenings  done by your health care provider during your annual well check will depend on your age, overall health, lifestyle risk factors, and family history of disease. Counseling  Your health care provider may ask you questions about your: Alcohol use. Tobacco use. Drug use. Emotional well-being. Home and relationship well-being. Sexual activity. Eating habits. History of falls. Memory and ability to understand (cognition). Work and work Astronomer. Screening  You may have the following tests or measurements: Height, weight, and BMI. Blood pressure. Lipid and cholesterol levels. These may be checked every 5 years, or more frequently if you are over 31 years old. Skin check. Lung cancer screening. You may have this screening every year starting at age 59 if you have a 30-pack-year history of smoking and currently smoke or have quit within the past 15 years. Fecal occult blood test (FOBT) of the stool. You may have this test every year starting at age 8. Flexible sigmoidoscopy or colonoscopy. You may have a sigmoidoscopy every 5 years or a colonoscopy every 10 years starting at age 46. Prostate cancer screening. Recommendations will vary depending on your family history and other risks. Hepatitis C blood test. Hepatitis B blood test. Sexually transmitted disease (STD) testing. Diabetes screening. This is done by checking your blood sugar (glucose) after you have not eaten for a while (fasting). You may have this done every 1-3 years. Abdominal aortic aneurysm (AAA) screening. You may need this if  you are a current or former smoker. Osteoporosis. You may be screened starting at age 49 if you are at high risk. Talk with your health care provider about your test results, treatment options, and if necessary, the need for more tests. Vaccines  Your health care provider may recommend certain vaccines, such as: Influenza vaccine. This is recommended every year. Tetanus, diphtheria, and acellular  pertussis (Tdap, Td) vaccine. You may need a Td booster every 10 years. Zoster vaccine. You may need this after age 37. Pneumococcal 13-valent conjugate (PCV13) vaccine. One dose is recommended after age 68. Pneumococcal polysaccharide (PPSV23) vaccine. One dose is recommended after age 31. Talk to your health care provider about which screenings and vaccines you need and how often you need them. This information is not intended to replace advice given to you by your health care provider. Make sure you discuss any questions you have with your health care provider. Document Released: 05/23/2015 Document Revised: 01/14/2016 Document Reviewed: 02/25/2015 Elsevier Interactive Patient Education  2017 [place] Prevention in the Home Falls can cause injuries. They can happen to people of all ages. There are many things you can do to make your home safe and to help prevent falls. What can I do on the outside of my home? Regularly fix the edges of walkways and driveways and fix any cracks. Remove anything that might make you trip as you walk through a door, such as a raised step or threshold. Trim any bushes or trees on the path to your home. Use bright outdoor lighting. Clear any walking paths of anything that might make someone trip, such as rocks or tools. Regularly check to see if handrails are loose or broken. Make sure that both sides of any steps have handrails. Any raised decks and porches should have guardrails on the edges. Have any leaves, snow, or ice cleared regularly. Use sand or salt on walking paths during winter. Clean up any spills in your garage right away. This includes oil or grease spills. What can I do in the bathroom? Use night lights. Install grab bars by the toilet and in the tub and shower. Do not use towel bars as grab bars. Use non-skid mats or decals in the tub or shower. If you need to sit down in the shower, use a plastic, non-slip stool. Keep the floor  dry. Clean up any water that spills on the floor as soon as it happens. Remove soap buildup in the tub or shower regularly. Attach bath mats securely with double-sided non-slip rug tape. Do not have throw rugs and other things on the floor that can make you trip. What can I do in the bedroom? Use night lights. Make sure that you have a light by your bed that is easy to reach. Do not use any sheets or blankets that are too big for your bed. They should not hang down onto the floor. Have a firm chair that has side arms. You can use this for support while you get dressed. Do not have throw rugs and other things on the floor that can make you trip. What can I do in the kitchen? Clean up any spills right away. Avoid walking on wet floors. Keep items that you use a lot in easy-to-reach places. If you need to reach something above you, use a strong step stool that has a grab bar. Keep electrical cords out of the way. Do not use floor polish or wax that makes floors slippery. If  you must use wax, use non-skid floor wax. Do not have throw rugs and other things on the floor that can make you trip. What can I do with my stairs? Do not leave any items on the stairs. Make sure that there are handrails on both sides of the stairs and use them. Fix handrails that are broken or loose. Make sure that handrails are as long as the stairways. Check any carpeting to make sure that it is firmly attached to the stairs. Fix any carpet that is loose or worn. Avoid having throw rugs at the top or bottom of the stairs. If you do have throw rugs, attach them to the floor with carpet tape. Make sure that you have a light switch at the top of the stairs and the bottom of the stairs. If you do not have them, ask someone to add them for you. What else can I do to help prevent falls? Wear shoes that: Do not have high heels. Have rubber bottoms. Are comfortable and fit you well. Are closed at the toe. Do not wear  sandals. If you use a stepladder: Make sure that it is fully opened. Do not climb a closed stepladder. Make sure that both sides of the stepladder are locked into place. Ask someone to hold it for you, if possible. Clearly mark and make sure that you can see: Any grab bars or handrails. First and last steps. Where the edge of each step is. Use tools that help you move around (mobility aids) if they are needed. These include: Canes. Walkers. Scooters. Crutches. Turn on the lights when you go into a dark area. Replace any light bulbs as soon as they burn out. Set up your furniture so you have a clear path. Avoid moving your furniture around. If any of your floors are uneven, fix them. If there are any pets around you, be aware of where they are. Review your medicines with your doctor. Some medicines can make you feel dizzy. This can increase your chance of falling. Ask your doctor what other things that you can do to help prevent falls. This information is not intended to replace advice given to you by your health care provider. Make sure you discuss any questions you have with your health care provider. Document Released: 02/20/2009 Document Revised: 10/02/2015 Document Reviewed: 05/31/2014 Elsevier Interactive Patient Education  2017 [employer].

## 2022-11-12 DIAGNOSIS — E039 Hypothyroidism, unspecified: Secondary | ICD-10-CM | POA: Diagnosis not present

## 2022-11-12 DIAGNOSIS — E2740 Unspecified adrenocortical insufficiency: Secondary | ICD-10-CM | POA: Diagnosis not present

## 2022-11-12 DIAGNOSIS — E273 Drug-induced adrenocortical insufficiency: Secondary | ICD-10-CM | POA: Diagnosis not present

## 2022-11-12 DIAGNOSIS — Z9289 Personal history of other medical treatment: Secondary | ICD-10-CM | POA: Diagnosis not present

## 2022-11-12 DIAGNOSIS — R4589 Other symptoms and signs involving emotional state: Secondary | ICD-10-CM | POA: Diagnosis not present

## 2022-11-12 DIAGNOSIS — E291 Testicular hypofunction: Secondary | ICD-10-CM | POA: Diagnosis not present

## 2022-11-15 DIAGNOSIS — F419 Anxiety disorder, unspecified: Secondary | ICD-10-CM | POA: Diagnosis not present

## 2022-11-15 DIAGNOSIS — C169 Malignant neoplasm of stomach, unspecified: Secondary | ICD-10-CM | POA: Diagnosis not present

## 2022-11-15 DIAGNOSIS — T50Z95A Adverse effect of other vaccines and biological substances, initial encounter: Secondary | ICD-10-CM | POA: Diagnosis not present

## 2022-11-15 DIAGNOSIS — E273 Drug-induced adrenocortical insufficiency: Secondary | ICD-10-CM | POA: Diagnosis not present

## 2022-11-15 DIAGNOSIS — E032 Hypothyroidism due to medicaments and other exogenous substances: Secondary | ICD-10-CM | POA: Diagnosis not present

## 2022-11-15 DIAGNOSIS — Z79899 Other long term (current) drug therapy: Secondary | ICD-10-CM | POA: Diagnosis not present

## 2022-11-15 DIAGNOSIS — D333 Benign neoplasm of cranial nerves: Secondary | ICD-10-CM | POA: Diagnosis not present

## 2022-11-15 DIAGNOSIS — R918 Other nonspecific abnormal finding of lung field: Secondary | ICD-10-CM | POA: Diagnosis not present

## 2022-11-15 DIAGNOSIS — E236 Other disorders of pituitary gland: Secondary | ICD-10-CM | POA: Diagnosis not present

## 2022-11-15 DIAGNOSIS — C160 Malignant neoplasm of cardia: Secondary | ICD-10-CM | POA: Diagnosis not present

## 2022-11-15 DIAGNOSIS — Z125 Encounter for screening for malignant neoplasm of prostate: Secondary | ICD-10-CM | POA: Diagnosis not present

## 2022-11-15 DIAGNOSIS — Z08 Encounter for follow-up examination after completed treatment for malignant neoplasm: Secondary | ICD-10-CM | POA: Diagnosis not present

## 2022-11-15 DIAGNOSIS — M79675 Pain in left toe(s): Secondary | ICD-10-CM | POA: Diagnosis not present

## 2022-11-15 DIAGNOSIS — Z87891 Personal history of nicotine dependence: Secondary | ICD-10-CM | POA: Diagnosis not present

## 2022-11-15 DIAGNOSIS — Z85028 Personal history of other malignant neoplasm of stomach: Secondary | ICD-10-CM | POA: Diagnosis not present

## 2022-11-22 NOTE — Progress Notes (Signed)
Cardiology Office Note  Date:  11/29/2022   ID:  Jose Hayes, DOB 1961/01/18, MRN [id number]  PCP:  Glori Luis, MD   Chief Complaint  Patient presents with   Follow-up    Patient is concerned with elevated blood pressure in the morning.  In office BP 142/88 but is dealing with stressor at home with sick dog.    HPI:  Jose Hayes is a very pleasant 62 year-old gentleman with  obesity,  smoking history, quit  [facility] 11/05/2011 with SVT Rhythm broke to normal sinus rhythm in the emergency room. Adenosine may have worked Stomach cancer, completed chemotherapy, developed adrenal and thyroid issues He presents for routine followup of his arrhythmia/SVT, HTN  Last seen by myself in clinic March 2023 Struggling with steroids, jittery or fatigue Followed by endocrine for adrenal insufficiency, primary hypothyroidism, secondary hypogonadism  Dealing with chronic Pain, lipoma  In the hospital December 2023 acute metabolic encephalopathy in the setting of viral influenza requiring stress steroids for adrenal crisis  Now retired from [employer], on disability given chronic health issues  EKG personally reviewed by myself on todays visit EKG Interpretation Date/Time:  Monday November 29 2022 10:27:22 EDT Ventricular Rate:  67 PR Interval:  180 QRS Duration:  104 QT Interval:  400 QTC Calculation: 422 R Axis:   60  Text Interpretation: Normal sinus rhythm Normal ECG When compared with ECG of 02-May-2022 19:35, infarct pattern has resolved Confirmed by Julien Nordmann [phone]) on 11/29/2022 10:58:49 AM   Prior imaging CT head Mild chronic microvascular ischemic changes in the cerebral white matter, as above. Large mucosal retention cysts or polyps in the maxillary sinuses bilaterally (right greater than left).  MRI Large mucous retention cysts in the bilateral maxillary sinuses. Mild mucosal thickening in the ethmoid air cells and frontal sinuses.  Acoutic neuroma  Other  past medical history reviewed Completed stomach cancer therapy,  "knocked out pituitary" On supplement therapy, adrenal, thyroid, testerosterone Completed chemotherapy through [facility] clinical trial  Fevers, fatigue daily last year H/A, tinnitus, tumors followed by MRI Possible acoustic neuroma vs mets  Retired from  Consulting civil engineer at [employer]. On long term disability  Medical fatigue, depleting cortisol Tried to treat with steroids Seeing therapist, celexa/xanax  Covid Jan 2022, "got through it" Ab infusion  Event monitor: NSR, 1 run SVT Triggered events (11) , one associated with short run of SVT, other triggers not associated with significant arrhythmia.  Echocardiogram was essentially normal with normal ejection fraction, normal biventricular systolic pressures.   PMH:   has a past medical history of Adrenal insufficiency (Addison's disease) (HCC), Allergy, Anxiety, COVID-19, Esophageal cancer (HCC) (10/21/2016), GERD (gastroesophageal reflux disease), Hyperglycemia, Hyperlipidemia, Hypertension, Left acoustic neuroma (HCC), Sleep apnea, SVT (supraventricular tachycardia), Thyroid disease, and Tobacco abuse.  PSH:    Past Surgical History:  Procedure Laterality Date   COLONOSCOPY     KNEE ARTHROSCOPY Right 07/30/2021   Procedure: right knee arthroscopy, debridement, removal loose body;  Surgeon: Cammy Copa, MD;  Location: [facility] OR;  Service: Orthopedics;  Laterality: Right;   LAPAROSCOPIC CHOLECYSTECTOMY  1991   SHOULDER SURGERY Right 1985, 1991, 2000   left x3   [place] teeth extraction     UPPER GASTROINTESTINAL ENDOSCOPY      Current Outpatient Medications  Medication Sig Dispense Refill   ALPRAZolam (XANAX) 1 MG tablet Take 1 mg by mouth 3 (three) times daily as needed for anxiety.     amLODipine (NORVASC) 5 MG tablet Take 5 mg by mouth daily.  gabapentin (NEURONTIN) 100 MG capsule Take 100 mg by mouth 3 (three) times daily.     hydrocortisone (CORTEF) 10 MG tablet Take  10-15 mg by mouth See admin instructions. Take 1.5 tablets (15 mg) by mouth in the morning (1 hour after levothyroxine) & take 1 tablet (10 mg) by mouth in the afternoon at 2 pm.     hydrocortisone sodium succinate (SOLU-CORTEF) 100 MG injection Inject 2 mLs (100 mg total) into the muscle as needed. For adrenal crisis. 1 each 1   levothyroxine (SYNTHROID) 88 MCG tablet Take 88 mcg by mouth daily before breakfast.     rosuvastatin (CRESTOR) 20 MG tablet Take 20 mg by mouth daily.     testosterone cypionate (DEPOTESTOSTERONE CYPIONATE) 200 MG/ML injection Inject into the muscle every Monday. 0.11mls     tiZANidine (ZANAFLEX) 4 MG tablet Take 1 tablet (4 mg total) by mouth every 8 (eight) hours as needed for muscle spasms. 30 tablet 0   No current facility-administered medications for this visit.    Allergies:   Patient has no known allergies.   Social History:  The patient  reports that he has quit smoking. His smoking use included cigarettes. He has a 12.5 pack-year smoking history. He has never used smokeless tobacco. He reports that he does not currently use alcohol. He reports that he does not use drugs.   Family History:   family history includes Clotting disorder in his father; Hypertension in his father and mother; Pancreatic cancer in his father.    Review of Systems: Review of Systems  Constitutional: Negative.   HENT: Negative.    Respiratory: Negative.    Cardiovascular: Negative.   Gastrointestinal: Negative.   Musculoskeletal: Negative.   Neurological: Negative.   Psychiatric/Behavioral: Negative.    All other systems reviewed and are negative.   PHYSICAL EXAM: VS:  BP (!) 142/88 (BP Location: Left Arm, Patient Position: Sitting, Cuff Size: Large)   Pulse 67   Ht 5\' 8"  (1.727 m)   Wt 254 lb 3.2 oz (115.3 kg)   SpO2 98%   BMI 38.65 kg/m  , BMI Body mass index is 38.65 kg/m. Constitutional:  oriented to person, place, and time. No distress.  HENT:  Head: Grossly  normal Eyes:  no discharge. No scleral icterus.  Neck: No JVD, no carotid bruits  Cardiovascular: Regular rate and rhythm, no murmurs appreciated Pulmonary/Chest: Clear to auscultation bilaterally, no wheezes or rails Abdominal: Soft.  no distension.  no tenderness.  Musculoskeletal: Normal range of motion Neurological:  normal muscle tone. Coordination normal. No atrophy Skin: Skin warm and dry Psychiatric: normal affect, pleasant   Recent Labs: 05/03/2022: TSH 0.664 05/04/2022: Hemoglobin 14.0; Magnesium 2.8; Platelets 162 11/23/2022: ALT 25; BUN 18; Creatinine, Ser 1.14; Potassium 4.1; Sodium 139    Lipid Panel Lab Results  Component Value Date   CHOL 129 11/23/2022   HDL 39.30 11/23/2022   LDLCALC 76 11/23/2022   TRIG 69.0 11/23/2022      Wt Readings from Last 3 Encounters:  11/29/22 254 lb 3.2 oz (115.3 kg)  11/23/22 257 lb 9.6 oz (116.8 kg)  11/02/22 255 lb (115.7 kg)     ASSESSMENT AND PLAN:  SVT (supraventricular tachycardia) (HCC) Denies tachyarrhythmia, no changes to his medications  Mixed hyperlipidemia Cholesterol is at goal on the current lipid regimen. No changes to the medications were made.  Stomach cancer Treated at [facility], clinical trial Completed his treatment, now in remission Reports no recurrence, imaging pulled up and reviewed  Essential hypertension Blood pressure running high in the morning, recommend we increase amlodipine up to 5 twice daily If pressures continue to run high may need to add ARB  Smoking Quit years ago  Aortic atherosclerosis Minimal plaquing noted in the distal descending aorta Cholesterol at goal, non-smoker, A1c reasonable   Total encounter time more than 30 minutes  Greater than 50% was spent in counseling and coordination of care with the patient   Orders Placed This Encounter  Procedures   EKG 12-Lead     Signed, Dossie Arbour, M.D., Ph.D. 11/29/2022  [facility] [place],  [place] [phone]

## 2022-11-23 ENCOUNTER — Ambulatory Visit: Payer: Medicare HMO | Admitting: Family Medicine

## 2022-11-23 ENCOUNTER — Telehealth: Payer: Self-pay | Admitting: Family Medicine

## 2022-11-23 ENCOUNTER — Encounter: Payer: Self-pay | Admitting: Family Medicine

## 2022-11-23 VITALS — BP 130/80 | HR 75 | Temp 98.2°F | Ht 68.0 in | Wt 257.6 lb

## 2022-11-23 DIAGNOSIS — E2740 Unspecified adrenocortical insufficiency: Secondary | ICD-10-CM

## 2022-11-23 DIAGNOSIS — G8929 Other chronic pain: Secondary | ICD-10-CM

## 2022-11-23 DIAGNOSIS — K4090 Unilateral inguinal hernia, without obstruction or gangrene, not specified as recurrent: Secondary | ICD-10-CM

## 2022-11-23 DIAGNOSIS — R7303 Prediabetes: Secondary | ICD-10-CM

## 2022-11-23 DIAGNOSIS — G629 Polyneuropathy, unspecified: Secondary | ICD-10-CM

## 2022-11-23 DIAGNOSIS — F419 Anxiety disorder, unspecified: Secondary | ICD-10-CM

## 2022-11-23 DIAGNOSIS — C160 Malignant neoplasm of cardia: Secondary | ICD-10-CM | POA: Diagnosis not present

## 2022-11-23 DIAGNOSIS — I10 Essential (primary) hypertension: Secondary | ICD-10-CM

## 2022-11-23 DIAGNOSIS — M545 Low back pain, unspecified: Secondary | ICD-10-CM | POA: Insufficient documentation

## 2022-11-23 DIAGNOSIS — E782 Mixed hyperlipidemia: Secondary | ICD-10-CM | POA: Diagnosis not present

## 2022-11-23 LAB — COMPREHENSIVE METABOLIC PANEL
ALT: 25 U/L (ref 0–53)
AST: 22 U/L (ref 0–37)
Albumin: 4.5 g/dL (ref 3.5–5.2)
Alkaline Phosphatase: 49 U/L (ref 39–117)
BUN: 18 mg/dL (ref 6–23)
CO2: 29 mEq/L (ref 19–32)
Calcium: 9.1 mg/dL (ref 8.4–10.5)
Chloride: 104 mEq/L (ref 96–112)
Creatinine, Ser: 1.14 mg/dL (ref 0.40–1.50)
GFR: 69.19 mL/min (ref >60.00)
Glucose, Bld: 99 mg/dL (ref 70–99)
Potassium: 4.1 mEq/L (ref 3.5–5.1)
Sodium: 139 mEq/L (ref 135–145)
Total Bilirubin: 0.8 mg/dL (ref 0.2–1.2)
Total Protein: 6.7 g/dL (ref 6.0–8.3)

## 2022-11-23 LAB — HEMOGLOBIN A1C: Hgb A1c MFr Bld: 6 % (ref 4.6–6.5)

## 2022-11-23 LAB — LIPID PANEL
Cholesterol: 129 mg/dL (ref 0–200)
HDL: 39.3 mg/dL (ref >39.00)
LDL Cholesterol: 76 mg/dL (ref 0–99)
NonHDL: 90.03
Total CHOL/HDL Ratio: 3
Triglycerides: 69 mg/dL (ref 0.0–149.0)
VLDL: 13.8 mg/dL (ref 0.0–40.0)

## 2022-11-23 MED ORDER — TIZANIDINE HCL 4 MG PO TABS: 4.0000 mg | ORAL_TABLET | Freq: Three times a day (TID) | ORAL | 0 refills | Status: DC | PRN

## 2022-11-23 MED ORDER — TIZANIDINE HCL 4 MG PO TABS
4.0000 mg | ORAL_TABLET | Freq: Three times a day (TID) | ORAL | 0 refills | Status: DC | PRN
Start: 2022-11-23 — End: 2022-12-28

## 2022-11-23 MED ORDER — TRAMADOL HCL 50 MG PO TABS: 50.0000 mg | ORAL_TABLET | Freq: Three times a day (TID) | ORAL | 0 refills | Status: DC | PRN

## 2022-11-23 MED ORDER — TRAMADOL HCL 50 MG PO TABS
50.0000 mg | ORAL_TABLET | Freq: Three times a day (TID) | ORAL | 0 refills | Status: AC | PRN
Start: 2022-11-23 — End: 2022-11-28

## 2022-11-23 NOTE — Assessment & Plan Note (Signed)
Chronic issue.  Improved on recheck.  Generally well-controlled at home.  Patient will continue amlodipine 5 mg daily.

## 2022-11-23 NOTE — Assessment & Plan Note (Signed)
Chronic issue.  Patient will continue treatment through his endocrinology team.

## 2022-11-23 NOTE — Assessment & Plan Note (Signed)
Patient is likely having some discomfort related to his prior surgery.  Patient today reports that he had a very large lipoma removed at the time of his hernia repair.  Certainly he could have had some nerve injury or other issue result from his surgery.  At this point he will follow-up with his general surgeon to get their input.

## 2022-11-23 NOTE — Assessment & Plan Note (Signed)
Chronic issue.  Patient is going to continue to follow with his psychiatry team.  Currently he is on Xanax and gabapentin for this.

## 2022-11-23 NOTE — Assessment & Plan Note (Signed)
Patient has undergone treatment for this.  No evidence of recurrence on recent imaging.  I encouraged patient to check with his oncology team to see if he needs a follow-up endoscopy.

## 2022-11-23 NOTE — Assessment & Plan Note (Signed)
Chronic issue.  Check lipid panel.  Continue Crestor 20 mg daily. 

## 2022-11-23 NOTE — Assessment & Plan Note (Signed)
Chronic issue.  Will refer to pain management to get their input.  Today we will prescribe tizanidine 4 mg every 8 hours as needed for spasms and tramadol 50 mg every 8 hours as needed for pain.  Advised to monitor for drowsiness with both of these.  Advised to not take both of them at the same time.  Advised not to drive if he is drowsy while taking them.

## 2022-11-23 NOTE — Assessment & Plan Note (Signed)
Chronic issue.  Patient will continue gabapentin 100 mg 3 times daily for this.  Discussed that there is room to increase the dose on this.

## 2022-11-23 NOTE — Progress Notes (Signed)
Jose Alar, MD Phone: [phone]  Jose Hayes is a 62 y.o. male who presents today for follow-up.  Hypertension: Typically 120s/less than 85.  Taking amlodipine.  No chest pain, shortness with, or edema.  Gastroesophageal cancer: Patient reports he had follow-up recently and had no evidence of disease on imaging.  They did start him on alpha lipoic acid to help with his neuropathy.  Patient wonders if it is time for him to have a follow-up endoscopy.  Left groin pain: This has been going on for a while.  He has achy discomfort and sometimes shooting discomfort.  He saw his general surgeon for this and notes they did an ultrasound that revealed possible urological issues.  He subsequently saw a urologist who said there was no urological cause for his symptoms.  Patient notes he needs to go back to see his general surgery to see what the neck step is.  Hyperlipidemia: Patient is on Crestor.  No right upper quadrant pain.  He does have chronic muscle and joint aches though he notes he has been off of Crestor previously and that did not help with those.  Chronic back pain/joint pain: Patient notes chronic low back pain.  He notes degenerative disc disease in that area.  He has been cleared to start back on some NSAIDs and notes they do not help.  He also has neuropathy.  He was recently started back on gabapentin help with his anxiety.  In the past he has taken tizanidine for back spasms.  He notes when he was going through cancer treatment he was on narcotics and managed that quite well.  Adrenal insufficiency: Patient notes his endocrinologist told him there is nothing else they can do to change his treatment at this time.  He notes they advised him he needs to get everything else under better control.  Obesity: Patient notes he is having trouble maintaining a good level of activity due to his chronic pain.  He does eat lots of fruits and vegetables and generally eats lean meats.  He  will occasionally have some diet soda.  He does drink sports drinks.  No fried foods.  Anxiety: Patient notes at this point he is only on Xanax and they recently restarted gabapentin.  He had bad tendinitis and imbalance with the buspirone.  Social History   Tobacco Use  Smoking Status Former   Current packs/day: 0.50   Average packs/day: 0.5 packs/day for 25.0 years (12.5 ttl pk-yrs)   Types: Cigarettes  Smokeless Tobacco Never  Tobacco Comments   trying gum to quit    Current Outpatient Medications on File Prior to Visit  Medication Sig Dispense Refill   ALPRAZolam (XANAX) 1 MG tablet Take 1 mg by mouth 3 (three) times daily as needed for anxiety.     amLODipine (NORVASC) 5 MG tablet Hold until followup with outpatient provider due to low blood pressure. 90 tablet 3   gabapentin (NEURONTIN) 100 MG capsule Take 100 mg by mouth 3 (three) times daily.     hydrocortisone (CORTEF) 10 MG tablet Take 10-15 mg by mouth See admin instructions. Take 1.5 tablets (15 mg) by mouth in the morning (1 hour after levothyroxine) & take 1 tablet (10 mg) by mouth in the afternoon at 2 pm.     hydrocortisone sodium succinate (SOLU-CORTEF) 100 MG injection Inject 2 mLs (100 mg total) into the muscle as needed. For adrenal crisis. 1 each 1   hydrOXYzine (ATARAX) 10 MG tablet Take 10 mg by  mouth 3 (three) times daily as needed.     levothyroxine (SYNTHROID) 88 MCG tablet Take 88 mcg by mouth daily before breakfast.     rosuvastatin (CRESTOR) 20 MG tablet Take 20 mg by mouth daily.     testosterone cypionate (DEPOTESTOSTERONE CYPIONATE) 200 MG/ML injection Inject into the muscle every Monday. 0.32mls     No current facility-administered medications on file prior to visit.     ROS see history of present illness  Objective  Physical Exam Vitals:   11/23/22 1013 11/23/22 1042  BP: (!) 140/84 130/80  Pulse: 75   Temp: 98.2 F (36.8 C)   SpO2: 96%     BP Readings from Last 3 Encounters:  11/23/22  130/80  08/03/22 138/88  05/24/22 118/78   Wt Readings from Last 3 Encounters:  11/23/22 257 lb 9.6 oz (116.8 kg)  11/02/22 255 lb (115.7 kg)  08/03/22 257 lb (116.6 kg)    Physical Exam Constitutional:      General: He is not in acute distress.    Appearance: He is not diaphoretic.  Cardiovascular:     Rate and Rhythm: Normal rate and regular rhythm.     Heart sounds: Normal heart sounds.  Pulmonary:     Effort: Pulmonary effort is normal.     Breath sounds: Normal breath sounds.  Musculoskeletal:     Right lower leg: No edema.     Left lower leg: No edema.  Skin:    General: Skin is warm and dry.  Neurological:     Mental Status: He is alert.      Assessment/Plan: Please see individual problem list.  Primary hypertension Assessment & Plan: Chronic issue.  Improved on recheck.  Generally well-controlled at home.  Patient will continue amlodipine 5 mg daily.  Orders: -     Comprehensive metabolic panel  Unilateral inguinal hernia without obstruction or gangrene, recurrence not specified Assessment & Plan: Patient is likely having some discomfort related to his prior surgery.  Patient today reports that he had a very large lipoma removed at the time of his hernia repair.  Certainly he could have had some nerve injury or other issue result from his surgery.  At this point he will follow-up with his general surgeon to get their input.   Anxiety Assessment & Plan: Chronic issue.  Patient is going to continue to follow with his psychiatry team.  Currently he is on Xanax and gabapentin for this.   Mixed hyperlipidemia Assessment & Plan: Chronic issue.  Check lipid panel.  Continue Crestor 20 mg daily.  Orders: -     Comprehensive metabolic panel -     Lipid panel  Chronic bilateral low back pain without sciatica Assessment & Plan: Chronic issue.  Will refer to pain management to get their input.  Today we will prescribe tizanidine 4 mg every 8 hours as needed for  spasms and tramadol 50 mg every 8 hours as needed for pain.  Advised to monitor for drowsiness with both of these.  Advised to not take both of them at the same time.  Advised not to drive if he is drowsy while taking them.  Orders: -     Ambulatory referral to Pain Clinic -     tiZANidine HCl; Take 1 tablet (4 mg total) by mouth every 8 (eight) hours as needed for muscle spasms.  Dispense: 30 tablet; Refill: 0 -     traMADol HCl; Take 1 tablet (50 mg total) by mouth every 8 (  eight) hours as needed for up to 5 days.  Dispense: 15 tablet; Refill: 0  Prediabetes -     Hemoglobin A1c  Gastroesophageal cancer (HCC) Assessment & Plan: Patient has undergone treatment for this.  No evidence of recurrence on recent imaging.  I encouraged patient to check with his oncology team to see if he needs a follow-up endoscopy.   Adrenal insufficiency (HCC) Assessment & Plan: Chronic issue.  Patient will continue treatment through his endocrinology team.   Neuropathy Assessment & Plan: Chronic issue.  Patient will continue gabapentin 100 mg 3 times daily for this.  Discussed that there is room to increase the dose on this.     Return in about 6 months (around 05/26/2023).  I have spent 48 minutes in the care of this patient regarding history taking, documentation, completion of exam, discussion of plan, chart review, placing orders.   Jose Alar, MD [facility]

## 2022-11-23 NOTE — Telephone Encounter (Signed)
Pt called in stating he had some meds refilled today and they [facility] then they re [facility] it and sent to [facility] but [facility] stated they could not refill it because [facility]. Pt would like to be called

## 2022-11-23 NOTE — Patient Instructions (Signed)
Nice to see you. You can try the Zanaflex for your muscle spasms.  You can also try the tramadol for your back pain.  Do not take both of those at the same time given risk of drowsiness.  If you get excessively drowsy with either please discontinue use and let us know. Will contact you with lab results. You will hear from the pain clinic in the next 2 weeks please let us know.

## 2022-11-23 NOTE — Assessment & Plan Note (Signed)
>>  ASSESSMENT AND PLAN FOR ANXIETY WRITTEN ON 11/23/2022 11:07 AM BY MARIBETH, ERIC G, MD  Chronic issue.  Patient is going to continue to follow with his psychiatry team.  Currently he is on Xanax  and gabapentin  for this.

## 2022-11-24 NOTE — Telephone Encounter (Signed)
I have called [facility] & cancelled the Tizanidine & Tramadol.  And I have notified [facility] that they can now rerun it & pt has been notified as well.  All prior pharmacies have been removed from chart to prevent this from happening again.

## 2022-11-24 NOTE — Telephone Encounter (Signed)
Patient called, no one has called from the office about his message from yesterday. He needs his medication.

## 2022-11-29 ENCOUNTER — Encounter: Payer: Self-pay | Admitting: Cardiovascular Disease

## 2022-11-29 ENCOUNTER — Ambulatory Visit: Payer: Medicare HMO | Attending: Cardiovascular Disease | Admitting: Cardiovascular Disease

## 2022-11-29 VITALS — BP 142/88 | HR 67 | Ht 68.0 in | Wt 254.2 lb

## 2022-11-29 DIAGNOSIS — F419 Anxiety disorder, unspecified: Secondary | ICD-10-CM | POA: Diagnosis not present

## 2022-11-29 DIAGNOSIS — E782 Mixed hyperlipidemia: Secondary | ICD-10-CM | POA: Diagnosis not present

## 2022-11-29 DIAGNOSIS — I10 Essential (primary) hypertension: Secondary | ICD-10-CM | POA: Diagnosis not present

## 2022-11-29 DIAGNOSIS — G4733 Obstructive sleep apnea (adult) (pediatric): Secondary | ICD-10-CM | POA: Diagnosis not present

## 2022-11-29 DIAGNOSIS — I471 Supraventricular tachycardia, unspecified: Secondary | ICD-10-CM | POA: Diagnosis not present

## 2022-11-29 MED ORDER — AMLODIPINE BESYLATE 5 MG PO TABS: 5.0000 mg | ORAL_TABLET | Freq: Two times a day (BID) | ORAL | 3 refills | Status: DC

## 2022-11-29 NOTE — Patient Instructions (Addendum)
Medication Instructions:  Please increase the amlodipine up to 5 mg twice a day Please monitor blood pressure  If you need a refill on your cardiac medications before your next appointment, please call your pharmacy.   Lab work: No new labs needed  Testing/Procedures: No new testing needed  Follow-Up: At [facility], you and your health needs are our priority.  As part of our continuing mission to provide you with exceptional heart care, we have created designated Provider Care Teams.  These Care Teams include your primary Cardiologist (physician) and Advanced Practice Providers (APPs -  Physician Assistants and Nurse Practitioners) who all work together to provide you with the care you need, when you need it.  You will need a follow up appointment in 12 months  Providers on your designated Care Team:   Nicolasa Ducking, NP Eula Listen, PA-C Cadence Fransico [place], [place]  COVID-19 Vaccine Information can be found at: [email or web address] For questions related to vaccine distribution or appointments, please email [email or web address] or call [phone].

## 2022-12-06 ENCOUNTER — Telehealth (INDEPENDENT_AMBULATORY_CARE_PROVIDER_SITE_OTHER): Payer: Medicare HMO | Admitting: Nurse Practitioner

## 2022-12-06 ENCOUNTER — Encounter: Payer: Self-pay | Admitting: Nurse Practitioner

## 2022-12-06 VITALS — Temp 98.4°F | Ht 68.0 in | Wt 254.0 lb

## 2022-12-06 DIAGNOSIS — J0100 Acute maxillary sinusitis, unspecified: Secondary | ICD-10-CM

## 2022-12-06 MED ORDER — AMOXICILLIN-POT CLAVULANATE 875-125 MG PO TABS: 1.0000 | ORAL_TABLET | Freq: Two times a day (BID) | ORAL | 0 refills | Status: DC

## 2022-12-06 NOTE — Assessment & Plan Note (Signed)
Symptoms consistent with sinusitis. Will treat with Augmentin 875 BID x 10 days. Encouraged adequate fluid intake. He can continue Tylenol/Ibuprofen PRN. Advised Mucinex daily to help with congestion. He can continue his nasal rinses. Return precautions given to patient.

## 2022-12-06 NOTE — Progress Notes (Signed)
MyChart Video Visit    Virtual Visit via Video Note   This visit type was conducted because this format is felt to be most appropriate for this patient at this time. Physical exam was limited by quality of the video and audio technology used for the visit. CMA was able to get the patient set up on a video visit.  Patient location: Home. Patient and provider in visit Provider location: Office  I discussed the limitations of evaluation and management by telemedicine and the availability of in person appointments. The patient expressed understanding and agreed to proceed.  Visit Date: 12/06/2022  Today's healthcare provider: Bethanie Dicker, NP     Subjective:    Patient ID: Jose Hayes, male    DOB: 30-Sep-1960, 62 y.o.   MRN: [id number]  Chief Complaint  Patient presents with   Sinusitis    Congestion & right side of head hurts been taking sudafed and tylenol worked for some has a nose lavage and has noticed some blood. Started: 5-6 days ago    HPI Symptoms for the last 5-6 days. Feels a lot of pressure and congestion in his sinuses, mostly on his right side. Respiratory illness:  Cough- Mild  Congestion-    Sinus- Yes, maxillary and frontal   Chest- No  Post nasal drip- No  Sore throat- No  Shortness of breath- No  Fever- No  Fatigue/Myalgia- No Headache- Yes Nausea/Vomiting- No Taste disturbance- No  Smell disturbance- No  Covid exposure- No  Covid vaccination- x 3  Flu vaccination- Not due  Medications- Sudafed, Tylenol   Past Medical History:  Diagnosis Date   Adrenal insufficiency (Addison's disease) (HCC)    Allergy    Anxiety    COVID-19    1/14, 05/24/20   Esophageal cancer (HCC) 10/21/2016   GE junction   GERD (gastroesophageal reflux disease)    Hyperglycemia    Hyperlipidemia    Hypertension    Left acoustic neuroma (HCC)    Sleep apnea    CPAP   SVT (supraventricular tachycardia)    2013   Thyroid disease    Tobacco abuse      Past Surgical History:  Procedure Laterality Date   COLONOSCOPY     KNEE ARTHROSCOPY Right 07/30/2021   Procedure: right knee arthroscopy, debridement, removal loose body;  Surgeon: Cammy Copa, MD;  Location: [facility] OR;  Service: Orthopedics;  Laterality: Right;   LAPAROSCOPIC CHOLECYSTECTOMY  1991   SHOULDER SURGERY Right 1985, 1991, 2000   left x3   [place] teeth extraction     UPPER GASTROINTESTINAL ENDOSCOPY      Family History  Problem Relation Age of Onset   Hypertension Mother    Hypertension Father    Pancreatic cancer Father    Clotting disorder Father        renal cell ca   Colon cancer Neg Hx    Esophageal cancer Neg Hx    Prostate cancer Neg Hx    Rectal cancer Neg Hx    Stomach cancer Neg Hx    Colon polyps Neg Hx     Social History   Socioeconomic History   Marital status: Married    Spouse name: Not on file   Number of children: Not on file   Years of education: Not on file   Highest education level: Not on file  Occupational History   Not on file  Tobacco Use   Smoking status: Former    Current packs/day: 0.50  Average packs/day: 0.5 packs/day for 25.0 years (12.5 ttl pk-yrs)    Types: Cigarettes   Smokeless tobacco: Never   Tobacco comments:    trying gum to quit  Vaping Use   Vaping status: Former  Substance and Sexual Activity   Alcohol use: Not Currently    Comment: occasionally   Drug use: No   Sexual activity: Not on file  Other Topics Concern   Not on file  Social History Narrative   Not on file   Social Determinants of Health   Financial Resource Strain: Low Risk  (11/02/2022)   Overall Financial Resource Strain (CARDIA)    Difficulty of Paying Living Expenses: Not hard at all  Food Insecurity: No Food Insecurity (11/02/2022)   Hunger Vital Sign    Worried About Running Out of Food in the Last Year: Never true    Ran Out of Food in the Last Year: Never true  Transportation Needs: No Transportation Needs (11/02/2022)    PRAPARE - Administrator, Civil Service (Medical): No    Lack of Transportation (Non-Medical): No  Physical Activity: Insufficiently Active (11/02/2022)   Exercise Vital Sign    Days of Exercise per Week: 4 days    Minutes of Exercise per Session: 20 min  Stress: Stress Concern Present (11/02/2022)   [employer] of Occupational Health - Occupational Stress Questionnaire    Feeling of Stress : Rather much  Social Connections: Moderately Integrated (11/02/2022)   Social Connection and Isolation Panel [NHANES]    Frequency of Communication with Friends and Family: More than three times a week    Frequency of Social Gatherings with Friends and Family: More than three times a week    Attends Religious Services: More than 4 times per year    Active Member of [employer] or Organizations: No    Attends Banker Meetings: Never    Marital Status: Married  Catering manager Violence: Not At Risk (11/02/2022)   Humiliation, Afraid, Rape, and Kick questionnaire    Fear of Current or Ex-Partner: No    Emotionally Abused: No    Physically Abused: No    Sexually Abused: No    Outpatient Medications Prior to Visit  Medication Sig Dispense Refill   ALPRAZolam (XANAX) 1 MG tablet Take 1 mg by mouth 3 (three) times daily as needed for anxiety.     amLODipine (NORVASC) 5 MG tablet Take 1 tablet (5 mg total) by mouth 2 (two) times daily. 180 tablet 3   hydrocortisone (CORTEF) 10 MG tablet Take 10-15 mg by mouth See admin instructions. Take 1.5 tablets (15 mg) by mouth in the morning (1 hour after levothyroxine) & take 1 tablet (10 mg) by mouth in the afternoon at 2 pm.     hydrocortisone sodium succinate (SOLU-CORTEF) 100 MG injection Inject 2 mLs (100 mg total) into the muscle as needed. For adrenal crisis. 1 each 1   levothyroxine (SYNTHROID) 88 MCG tablet Take 88 mcg by mouth daily before breakfast.     rosuvastatin (CRESTOR) 20 MG tablet Take 20 mg by mouth daily.     testosterone  cypionate (DEPOTESTOSTERONE CYPIONATE) 200 MG/ML injection Inject into the muscle every Monday. 0.37mls     gabapentin (NEURONTIN) 100 MG capsule Take 100 mg by mouth 3 (three) times daily. (Patient not taking: Reported on 12/06/2022)     tiZANidine (ZANAFLEX) 4 MG tablet Take 1 tablet (4 mg total) by mouth every 8 (eight) hours as needed for muscle spasms. (Patient  not taking: Reported on 12/06/2022) 30 tablet 0   No facility-administered medications prior to visit.    No Known Allergies  ROS See HPI    Objective:    Physical Exam  Temp 98.4 F (36.9 C) Comment: pt reported  Ht 5\' 8"  (1.727 m)   Wt 254 lb (115.2 kg)   BMI 38.62 kg/m  Wt Readings from Last 3 Encounters:  12/06/22 254 lb (115.2 kg)  11/29/22 254 lb 3.2 oz (115.3 kg)  11/23/22 257 lb 9.6 oz (116.8 kg)   GENERAL: alert, oriented, appears well and in no acute distress   HEENT: atraumatic, conjunttiva clear, no obvious abnormalities on inspection of external nose and ears   NECK: normal movements of the head and neck   LUNGS: on inspection no signs of respiratory distress, breathing rate appears normal, no obvious gross SOB, gasping or wheezing   CV: no obvious cyanosis   MS: moves all visible extremities without noticeable abnormality   PSYCH/NEURO: pleasant and cooperative, no obvious depression or anxiety, speech and thought processing grossly intact     Assessment & Plan:   Problem List Items Addressed This Visit       Respiratory   Acute non-recurrent maxillary sinusitis - Primary    Symptoms consistent with sinusitis. Will treat with Augmentin 875 BID x 10 days. Encouraged adequate fluid intake. He can continue Tylenol/Ibuprofen PRN. Advised Mucinex daily to help with congestion. He can continue his nasal rinses. Return precautions given to patient.       Relevant Medications   amoxicillin-clavulanate (AUGMENTIN) 875-125 MG tablet    I am having Azlan Augusto. Goeser start on  amoxicillin-clavulanate. I am also having him maintain his ALPRAZolam, hydrocortisone, levothyroxine, testosterone cypionate, gabapentin, hydrocortisone sodium succinate, rosuvastatin, tiZANidine, and amLODipine.  Meds ordered this encounter  Medications   amoxicillin-clavulanate (AUGMENTIN) 875-125 MG tablet    Sig: Take 1 tablet by mouth 2 (two) times daily.    Dispense:  20 tablet    Refill:  0    Order Specific Question:   Supervising Provider    Answer:   Birdie Sons, ERIC G [[id number]]    I discussed the assessment and treatment plan with the patient. The patient was provided an opportunity to ask questions and all were answered. The patient agreed with the plan and demonstrated an understanding of the instructions.   The patient was advised to call back or seek an in-person evaluation if the symptoms worsen or if the condition fails to improve as anticipated.   Bethanie Dicker, NP [facility] [phone] (phone) [phone] (fax)  [facility]

## 2022-12-07 ENCOUNTER — Encounter: Payer: Self-pay | Admitting: Family Medicine

## 2022-12-07 ENCOUNTER — Telehealth: Payer: Self-pay

## 2022-12-07 NOTE — Telephone Encounter (Signed)
Printed the paperwork out and placed in Dr. Purvis Sheffield needs to be signed basket.

## 2022-12-09 DIAGNOSIS — F1320 Sedative, hypnotic or anxiolytic dependence, uncomplicated: Secondary | ICD-10-CM | POA: Diagnosis not present

## 2022-12-09 DIAGNOSIS — R4589 Other symptoms and signs involving emotional state: Secondary | ICD-10-CM | POA: Diagnosis not present

## 2022-12-13 ENCOUNTER — Telehealth: Payer: Self-pay

## 2022-12-13 NOTE — Telephone Encounter (Signed)
Sent the referral person a message to check on the pain management referral.

## 2022-12-14 NOTE — Telephone Encounter (Signed)
error 

## 2022-12-15 NOTE — Telephone Encounter (Signed)
Noted  

## 2022-12-27 ENCOUNTER — Encounter: Payer: Self-pay | Admitting: Cardiovascular Disease

## 2022-12-28 ENCOUNTER — Encounter: Payer: Self-pay | Admitting: Student in an Organized Health Care Education/Training Program

## 2022-12-28 ENCOUNTER — Ambulatory Visit
Payer: Medicare HMO | Attending: Student in an Organized Health Care Education/Training Program | Admitting: Student in an Organized Health Care Education/Training Program

## 2022-12-28 ENCOUNTER — Telehealth: Payer: Self-pay

## 2022-12-28 VITALS — BP 145/88 | HR 65 | Temp 99.7°F | Resp 18 | Ht 68.0 in | Wt 240.0 lb

## 2022-12-28 DIAGNOSIS — R1032 Left lower quadrant pain: Secondary | ICD-10-CM | POA: Diagnosis not present

## 2022-12-28 DIAGNOSIS — M5442 Lumbago with sciatica, left side: Secondary | ICD-10-CM | POA: Diagnosis not present

## 2022-12-28 DIAGNOSIS — Z9889 Other specified postprocedural states: Secondary | ICD-10-CM | POA: Diagnosis not present

## 2022-12-28 DIAGNOSIS — G620 Drug-induced polyneuropathy: Secondary | ICD-10-CM

## 2022-12-28 DIAGNOSIS — G894 Chronic pain syndrome: Secondary | ICD-10-CM | POA: Diagnosis not present

## 2022-12-28 DIAGNOSIS — G588 Other specified mononeuropathies: Secondary | ICD-10-CM | POA: Diagnosis not present

## 2022-12-28 DIAGNOSIS — Z8719 Personal history of other diseases of the digestive system: Secondary | ICD-10-CM

## 2022-12-28 DIAGNOSIS — M5136 Other intervertebral disc degeneration, lumbar region: Secondary | ICD-10-CM | POA: Diagnosis not present

## 2022-12-28 DIAGNOSIS — G8929 Other chronic pain: Secondary | ICD-10-CM | POA: Diagnosis not present

## 2022-12-28 DIAGNOSIS — T451X5A Adverse effect of antineoplastic and immunosuppressive drugs, initial encounter: Secondary | ICD-10-CM | POA: Diagnosis not present

## 2022-12-28 DIAGNOSIS — M47816 Spondylosis without myelopathy or radiculopathy, lumbar region: Secondary | ICD-10-CM | POA: Diagnosis not present

## 2022-12-28 HISTORY — DX: Left lower quadrant pain: R10.32

## 2022-12-28 MED ORDER — GABAPENTIN 300 MG PO CAPS: ORAL_CAPSULE | ORAL | 0 refills | Status: AC

## 2022-12-28 NOTE — Progress Notes (Signed)
Patient: Jose Hayes  Service Category: E/M  Provider: Edward Jolly, MD  DOB: 10/17/1960  DOS: 12/28/2022  Referring Provider: Glori Luis, MD  MRN: [id number]  Setting: Ambulatory outpatient  PCP: Glori Luis, MD  Type: New Patient  Specialty: Interventional Pain Management    Location: Office  Delivery: Face-to-face     Primary Reason(s) for Visit: Encounter for initial evaluation of one or more chronic problems (new to examiner) potentially causing chronic pain, and posing a threat to normal musculoskeletal function. (Level of risk: High) CC: Back Pain  HPI  Jose Hayes is a 62 y.o. year old, male patient, who comes for the first time to our practice referred by Glori Luis, MD for our initial evaluation of his chronic pain. He has Hyperlipidemia; SVT (supraventricular tachycardia); Obesity; Hypertension; Restless leg syndrome; Kidney stones; Gastroesophageal cancer (HCC); Adrenal insufficiency (HCC); Hypothyroidism; Anxiety; Inguinal hernia; Pulsatile tinnitus; Left foot pain; Right knee pain; Complex tear of lateral meniscus of right knee as current injury; Loose body in knee, right knee; AMS (altered mental status); Deviated nasal septum; Left acoustic neuroma (HCC); OSA (obstructive sleep apnea); Poison ivy; Chronic bilateral low back pain with left-sided sciatica; Neuropathy; Acute non-recurrent maxillary sinusitis; Lumbar degenerative disc disease; Severe left groin pain; Status post bilateral inguinal hernia repair; Chronic pain syndrome; Lumbar facet arthropathy; and Chemotherapy-induced neuropathy (HCC) on their problem list. Today he comes in for evaluation of his Back Pain  Pain Assessment: Location: Lower, Left Back Radiating: Denies. Secondary pain in left groin area. Onset: More than a month ago Duration: Chronic pain Quality: Numbness, Shooting, Spasm, Tingling, Aching, Sharp Severity: 2 /10 (subjective, self-reported pain score)  Effect on ADL: Limit  ADLs Timing: Constant Modifying factors: Denies BP: (!) 145/88  HR: 65  Onset and Duration: Gradual Cause of pain:  Hernia Repair Severity: Getting worse, No change since onset, NAS-11 at its worse: 8/10, NAS-11 at its best: 1/10, NAS-11 now: 3/10, and NAS-11 on the average: 4/10 Timing: After activity or exercise Aggravating Factors: Bending, Lifiting, Motion, Prolonged sitting, Prolonged standing, Squatting, Stooping , Twisting, Walking, Walking uphill, and Walking downhill Alleviating Factors: Lying down, Medications, Resting, and Warm showers or baths Associated Problems: Fatigue, Numbness, Spasms, Tingling, Pain that wakes patient up, and Pain that does not allow patient to sleep Quality of Pain: Aching, Intermittent, Exhausting, Sharp, and Tingling Previous Examinations or Tests: CT scan and MRI scan Previous Treatments: Chiropractic manipulations, Epidural steroid injections, Narcotic medications, Physical Therapy, and Steroid treatments by mouth  Jose Hayes is being evaluated for possible interventional pain management therapies for the treatment of his chronic pain.   Jose Hayes is a pleasant 62 year old male who presents with a chief complaint of axial low back pain as well as left groin pain.  He states that his low back pain is chronic in nature and has been going on for many years.  He has a history of lumbar degenerative disc disease and lumbar spondylolisthesis.  Also pertinent to his medical history is a history of bilateral inguinal hernia surgery now with persistent and chronic left groin pain consistent with ilioinguinal/genitofemoral neuralgia.  He has had a full urologic workup which did not reveal any urologic issues for his left groin pain.  Of note he has a history of stomach cancer stage IV in 2018 and he received immunotherapy and chemotherapy.  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  He is on chronic steroids.  He also has a history of anxiety and PTSD.  He has seen psychiatry in the past and has tried various SSRIs and SNRIs.  He takes alprazolam as needed for anxiety.  He also has a history of hypertension, hyperlipidemia, obstructive sleep apnea on CPAP as well as obesity.  He has not had any recent MRIs done.   Jose Hayes has been informed that this initial visit was an evaluation only.  On the follow up appointment I will go over the results, including ordered tests and available interventional therapies. At that time he will have the opportunity to decide whether to proceed with offered therapies or not. In the event that Jose Hayes prefers avoiding interventional options, this will conclude our involvement in the case.  Medication management recommendations may be provided upon request.  Historic Controlled Substance Pharmacotherapy Review  PMP and historical list of controlled substances: Alprazolam 1 mg twice daily to 3 times daily as needed Historical Monitoring: The patient  reports no history of drug use. List of prior UDS Testing: Lab Results  Component Value Date   [facility] <10 05/02/2022   Historical Background Evaluation: [place] PMP: PDMP not reviewed this encounter. Review of the past 76-months conducted.             [place] Department of public safety, offender search: Engineer, mining Information) Non-contributory Risk Assessment Profile: Aberrant behavior: None observed or detected today Risk factors for fatal opioid overdose: None identified today Fatal overdose hazard ratio (HR): Calculation deferred Non-fatal overdose hazard ratio (HR): Calculation deferred Risk of opioid abuse or dependence: 0.7-3.0% with doses ? 36 MME/day and 6.1-26% with doses ? 120 MME/day. Substance use disorder (SUD) risk level: See below Personal History of Substance Abuse (SUD-Substance use disorder):  Alcohol:    Illegal Drugs:    Rx Drugs:     ORT Risk Level calculation:    ORT Scoring interpretation table:  Score <3 = Low Risk for SUD  Score between 4-7 = Moderate Risk for SUD  Score >8 = High Risk for Opioid Abuse   PHQ-2 Depression Scale:  Total score: 1  PHQ-2 Scoring interpretation table: (Score and probability of major depressive disorder)  Score 0 = No depression  Score 1 = 15.4% Probability  Score 2 = 21.1% Probability  Score 3 = 38.4% Probability  Score 4 = 45.5% Probability  Score 5 = 56.4% Probability  Score 6 = 78.6% Probability   PHQ-9 Depression Scale:  Total score: 1  PHQ-9 Scoring interpretation table:  Score 0-4 = No depression  Score 5-9 = Mild depression  Score 10-14 = Moderate depression  Score 15-19 = Moderately severe depression  Score 20-27 = Severe depression (2.4 times higher risk of SUD and 2.89 times higher risk of overuse)   Pharmacologic Plan: As per protocol, I have not taken over any controlled substance management, pending the results of ordered tests and/or consults.            Initial impression: Pending review of available data and ordered tests.  Meds   Current Outpatient Medications:    ALPRAZolam (XANAX) 1 MG tablet, Take 1 mg by mouth 3 (three) times daily as needed for anxiety., Disp: , Rfl:    amLODipine (NORVASC) 5 MG tablet, Take 1 tablet (5 mg total) by mouth 2 (two) times daily., Disp: 180 tablet, Rfl: 3   escitalopram (LEXAPRO) 5 MG tablet, Take by mouth., Disp: , Rfl:    gabapentin (NEURONTIN)  300 MG capsule, Take 1 capsule (300 mg total) by mouth at bedtime for 15 days, THEN 1 capsule (300 mg total) 2 (two) times daily., Disp: 105 capsule, Rfl: 0   hydrocortisone (CORTEF) 10 MG tablet, Take 10-15 mg by mouth See admin instructions. Take 1.5 tablets (15 mg) by mouth in the morning (1 hour after levothyroxine) & take 1 tablet (10 mg) by mouth in the afternoon at 2 pm., Disp: , Rfl:    hydrocortisone sodium succinate (SOLU-CORTEF) 100 MG injection, Inject 2 mLs (100 mg  total) into the muscle as needed. For adrenal crisis., Disp: 1 each, Rfl: 1   levothyroxine (SYNTHROID) 88 MCG tablet, Take 88 mcg by mouth daily before breakfast., Disp: , Rfl:    rosuvastatin (CRESTOR) 20 MG tablet, Take 20 mg by mouth daily., Disp: , Rfl:    testosterone cypionate (DEPOTESTOSTERONE CYPIONATE) 200 MG/ML injection, Inject into the muscle every Monday. 0.64mls, Disp: , Rfl:   Imaging Review  Narrative FINDINGS CLINICAL DATA:  EVALUATE FOR BANKART LESION. LEFT SHOULDER ARTHROGRAM: THE PATIENT IS SCHEDULED FOR A RIGHT SHOULDER MRI ARTHROGRAM AT DR. Greig Right OFFICE. AFTER OBTAINING INFORMED CONSENT, THE RIGHT SHOULDER WAS PREPPED WITH BETADINE AND 2 PERCENT LIDOCAINE WAS USED FOR LOCAL ANESTHESIA. A 22 GAUGE SPINAL NEEDLE WAS PLACED INTO THE RIGHT SHOULDER JOINT USING FLUOROSCOPIC GUIDANCE. I INJECTED APPROXIMATELY 6 CC OF HYPAQUE MIXED WITH 0.2 CC OF OMNISCAN. NO ROTATOR CUFF TEAR IS SEEN. THE NEEDLE WAS REMOVED AND THE PATIENT WAS INSTRUCTED TO GO IMMEDIATE FOR MRI AT DR. Greig Right OFFICE. IMPRESSION  DG Lumbar Spine Complete  Narrative Clinical Data:  Low back pain and left posterior rib pain for the past 6 weeks.  No known injury. LUMBAR SPINE - 4 VIEW, 5 FILMS: Findings:  Moderate L2-3 disc degeneration with disc space narrowing and osteophyte formation with small Schmorl's node deformity.  Small Schmorl's node deformity also seen at the T10-11 and L1-2 levels.  Mild L5-S1 disc space narrowing and bilateral facet joint degenerative changes. IMPRESSION: Degenerative changes most notable L2-3 level as noted above. LEFT-SIDED RIB SERIES INCLUDING PA CHEST: Comparison:  Chest 06/25/05. Findings:  Heart is slightly enlarged.  Mediastinal structures stable.  No infiltrate, congestive heart failure, or pneumothorax.  No left-sided rib fracture or destructive lesion. IMPRESSION: 1.  No left-sided rib fracture or pneumothorax. 2.  Scattered mild degenerative changes throughout  the thoracic spine. 3.  Cardiomegaly.  Provider: Ar[place]ta [place] Addendum 07/15/2021 12:00 PM ADDENDUM REPORT: 07/15/2021 11:58  ADDENDUM: There is a low signal 9 mm loose body seen just anterior to the root of the anterior horn of the lateral meniscus (sagittal image 13, axial image 19, coronal image 12).   Electronically Signed By: Neita Garnet M.D. On: 07/15/2021 11:58  Narrative CLINICAL DATA:  Slipped on a bank at a pond 7 months ago and dislocated patella 7 months ago. Six weeks ago turned, popping knee and made the pain worse again. Evaluate for meniscal tear versus ACL tear.  EXAM: MRI OF THE RIGHT KNEE WITHOUT CONTRAST  TECHNIQUE: Multiplanar, multisequence MR imaging of the knee was performed. No intravenous contrast was administered.  COMPARISON:  Right knee radiographs 07/01/2021  FINDINGS: MENISCI  Medial meniscus: There is intermediate signal and degenerative fraying of the central third of the posterior horn of the medial meniscus. There is an oblique undersurface tear extending through the inferior articular surface of the central third of the posterior horn of the medial meniscus (sagittal images 20 through 23) and within the inferior  articular surface of the central third of the posterior segment of the body of the medial meniscus (coronal image 19). One slice more anteriorly, there is minimal extension of this tear into the superior articular surface of the middle third of the meniscal triangle (coronal image 18).  Lateral meniscus: There is moderate attenuation of the posterior horn of the lateral meniscus with a complex tear extending throughout the inferior articular surface of the meniscal triangle diffusely (sagittal images 6 through 10). There is high-grade attenuation of the junction of the body and posterior horn of the lateral meniscus. There is degenerative blunting of the central third of the mid AP dimension of the body of the lateral  meniscus. There is moderate intermediate proton density signal intrasubstance degeneration throughout the anterior horn of the lateral meniscus with mild degenerative fraying of the superior articular surface of the junction of the anterior horn and body of the lateral meniscus. There appears to be a flipped a fragment of meniscal tissue into the posterior aspect of the intercondylar notch (sagittal image 14), indicating a bucket-handle tear.  LIGAMENTS  Cruciates: The ACL and PCL are intact.  Collaterals: The medial collateral ligament is intact. The fibular collateral ligament, biceps femoris tendon, iliotibial band, and popliteus tendon are intact.  CARTILAGE  Patellofemoral: Moderate to high-grade thinning of the inferior aspect of the medial patellar facet cartilage. There is full-thickness cartilage loss throughout the majority of the lateral trochlea and region measuring up to approximately 16 mm in craniocaudal dimension. Mild thinning of the medial trochlear cartilage.  Medial: Mild thinning of the posterolateral aspect of the weight-bearing medial femoral condyle cartilage.  Lateral: Full-thickness cartilage loss within the mid transverse dimension of the lateral tibial plateau (coronal image 15). Moderate thinning of the lateral aspect of the weight-bearing lateral femoral condyle cartilage.  Joint: Moderate-to-largejoint effusion. Normal Hoffa's fat pad. No plical thickening.  Popliteal Fossa: Moderate Baker's cyst measuring up to approximately 5 cm in craniocaudal dimension with a partially ruptured component extending inferiorly superficial to the medial head of the gastrocnemius muscle.  Extensor Mechanism: The distal quadriceps tendon insertion is intact. Very mild distal insertional patellar tendinosis.  Bones: There is moderate marrow edema throughout the lateral tibial spine (coronal series 6, image 18) and extending throughout the proximal lateral  tibial physis region and minimally into the proximal anterior medial tibial plateau. There also lobular cysts within the posterolateral aspect of the proximal medial tibial plateau, inferior and posterior to the medial tibial spine, in the region measuring up to approximately 1.9 x 1.5 x 1.3 cm (transverse by AP by craniocaudal). This appears to represent chronic degenerative change that extends from the insertion of the PCL on the posterior tibia (sagittal image 17 and coronal image 21).  Other: Mild-to-moderate peripheral lateral compartment degenerative osteophytes.  IMPRESSION:: IMPRESSION: 1. Moderate trochlear and mild-to-moderate medial and lateral compartment cartilage degenerative changes. 2. Degenerative change and complex tearing of the posterior horn and body of the lateral meniscus with a likely a bucket-handle tear and flipped fragment into the intercondylar notch. The main donor sites for this bucket-handle tear appears to be the body and posterior horn of the lateral meniscus. 3. Moderate-to-large joint effusion. Moderate Baker's cyst with partially ruptured component extending inferiorly. 4. Marrow edema within the lateral tibial spine extending into the anterolateral greater than anteromedial tibial plateaus. Chronic degenerative cystic changes deep to the medial tibial spine, likely extending from the insertion of the PCL on the tibia.  Electronically Signed: By: Windy Fast  Viola M.D. On: 07/15/2021 10:52   Narrative CLINICAL DATA:  Right knee pain.  EXAM: RIGHT KNEE - COMPLETE 4+ VIEW  COMPARISON:  None.  FINDINGS: No evidence of fracture, dislocation, or joint effusion. Mild narrowing of medial joint space is noted. Soft tissues are unremarkable.  IMPRESSION: Mild degenerative joint disease is noted medially. No acute abnormality seen.   Electronically Signed By: Lupita Raider M.D. On: 07/02/2021 15:47   DG Foot Complete  Right  Narrative CLINICAL DATA:  Pt to ED for sudden dizziness and increased tinnitus in both ears. +nausea. Dizziness worsens with change in position Hx tinnitus, headaches, tumors in brain Stepped on nail with right foot Sunday, went and got tetanus shot.stepped on rusty nail  EXAM: RIGHT FOOT COMPLETE - 3+ VIEW  COMPARISON:  None.  FINDINGS: No fracture or dislocation of mid foot or forefoot. The phalanges are normal. The calcaneus is normal. No soft tissue abnormality.  IMPRESSION: No acute osseous abnormality.  No foreign body.   Electronically Signed By: Genevive Bi M.D. On: 03/18/2021 15:03    Complexity Note: Imaging results reviewed.                         ROS  Cardiovascular: Abnormal heart rhythm Pulmonary or Respiratory: Temporary stoppage of breathing during sleep Neurological: Abnormal skin sensations (Peripheral Neuropathy) Psychological-Psychiatric: Anxiousness and Prone to panicking Gastrointestinal: Heartburn due to stomach pushing into lungs (Hiatal hernia) and Reflux or heatburn Genitourinary: No reported renal or genitourinary signs or symptoms such as difficulty voiding or producing urine, peeing blood, non-functioning kidney, kidney stones, difficulty emptying the bladder, difficulty controlling the flow of urine, or chronic kidney disease Hematological: No reported hematological signs or symptoms such as prolonged bleeding, low or poor functioning platelets, bruising or bleeding easily, hereditary bleeding problems, low energy levels due to low hemoglobin or being anemic Endocrine: Slow thyroid Rheumatologic: No reported rheumatological signs and symptoms such as fatigue, joint pain, tenderness, swelling, redness, heat, stiffness, decreased range of motion, with or without associated rash Musculoskeletal: Negative for myasthenia gravis, muscular dystrophy, multiple sclerosis or malignant hyperthermia Work History: Disabled  Allergies  Mr.  Hayes has No Known Allergies.  Laboratory Chemistry Profile   Renal Lab Results  Component Value Date   BUN 18 11/23/2022   CREATININE 1.14 11/23/2022   BCR NOT APPLICABLE 12/19/2020   GFR 69.19 11/23/2022   GFRAA >60 11/06/2011   GFRNONAA >60 05/04/2022   SPECGRAV 1.020 10/20/2021   PHUR 5.0 10/20/2021   PROTEINUR NEGATIVE 05/03/2022     Electrolytes Lab Results  Component Value Date   NA 139 11/23/2022   K 4.1 11/23/2022   CL 104 11/23/2022   CALCIUM 9.1 11/23/2022   MG 2.8 (H) 05/04/2022     Hepatic Lab Results  Component Value Date   AST 22 11/23/2022   ALT 25 11/23/2022   ALBUMIN 4.5 11/23/2022   ALKPHOS 49 11/23/2022   LIPASE 15.0 10/13/2016     ID Lab Results  Component Value Date   HIV Non Reactive 05/03/2022   SARSCOV2NAA NEGATIVE 05/02/2022     Bone No results found for: "VD25OH", "VD125OH2TOT", "UJ8119JY7", "WG9562ZH0", "25OHVITD1", "25OHVITD2", "25OHVITD3", "TESTOFREE", "TESTOSTERONE"   Endocrine Lab Results  Component Value Date   GLUCOSE 99 11/23/2022   GLUCOSEU NEGATIVE 05/03/2022   HGBA1C 6.0 11/23/2022   TSH 0.664 05/03/2022     Neuropathy Lab Results  Component Value Date   HGBA1C 6.0 11/23/2022   HIV Non  Reactive 05/03/2022     CNS No results found for: "COLORCSF", "APPEARCSF", "RBCCOUNTCSF", "WBCCSF", "POLYSCSF", "LYMPHSCSF", "EOSCSF", "PROTEINCSF", "GLUCCSF", "JCVIRUS", "CSFOLI", "IGGCSF", "LABACHR", "ACETBL"   Inflammation (CRP: Acute  ESR: Chronic) Lab Results  Component Value Date   ESRSEDRATE 13 05/03/2022   LATICACIDVEN 1.5 05/03/2022     Rheumatology Lab Results  Component Value Date   LABURIC 4.8 06/22/2021     Coagulation Lab Results  Component Value Date   INR 1.2 05/02/2022   LABPROT 14.6 05/02/2022   APTT 34 05/02/2022   PLT 162 05/04/2022     Cardiovascular Lab Results  Component Value Date   CKTOTAL 168 11/06/2011   CKMB 1.8 11/06/2011   TROPONINI 0.04 11/06/2011   HGB 14.0 05/04/2022   HCT  43.1 05/04/2022     Screening Lab Results  Component Value Date   SARSCOV2NAA NEGATIVE 05/02/2022   HIV Non Reactive 05/03/2022     Cancer Lab Results  Component Value Date   CEA 1.3 10/13/2016     Allergens No results found for: "ALMOND", "APPLE", "ASPARAGUS", "AVOCADO", "BANANA", "BARLEY", "BASIL", "BAYLEAF", "GREENBEAN", "LIMABEAN", "WHITEBEAN", "BEEFIGE", "REDBEET", "BLUEBERRY", "BROCCOLI", "CABBAGE", "MELON", "CARROT", "CASEIN", "CASHEWNUT", "CAULIFLOWER", "CELERY"     Note: Lab results reviewed.  PFSH  Drug: Jose Hayes  reports no history of drug use. Alcohol:  reports that he does not currently use alcohol. Tobacco:  reports that he has quit smoking. His smoking use included cigarettes. He has a 12.5 pack-year smoking history. He has never used smokeless tobacco. Medical:  has a past medical history of Adrenal insufficiency (Addison's disease) (HCC), Allergy, Anxiety, COVID-19, Esophageal cancer (HCC) (10/21/2016), GERD (gastroesophageal reflux disease), Hyperglycemia, Hyperlipidemia, Hypertension, Left acoustic neuroma (HCC), Sleep apnea, SVT (supraventricular tachycardia), Thyroid disease, and Tobacco abuse. Family: family history includes Clotting disorder in his father; Hypertension in his father and mother; Pancreatic cancer in his father.  Past Surgical History:  Procedure Laterality Date   COLONOSCOPY     KNEE ARTHROSCOPY Right 07/30/2021   Procedure: right knee arthroscopy, debridement, removal loose body;  Surgeon: Cammy Copa, MD;  Location: [facility] OR;  Service: Orthopedics;  Laterality: Right;   LAPAROSCOPIC CHOLECYSTECTOMY  1991   SHOULDER SURGERY Right 1985, 1991, 2000   left x3   [place] teeth extraction     UPPER GASTROINTESTINAL ENDOSCOPY     Active Ambulatory Problems    Diagnosis Date Noted   Hyperlipidemia 12/01/2011   SVT (supraventricular tachycardia) 12/01/2011   Obesity 12/01/2011   Hypertension 12/01/2011   Restless leg syndrome  09/04/2015   Kidney stones 10/13/2016   Gastroesophageal cancer (HCC) 11/02/2016   Adrenal insufficiency (HCC) 05/30/2020   Hypothyroidism 05/30/2020   Anxiety 05/30/2020   Inguinal hernia 12/19/2020   Pulsatile tinnitus 06/22/2021   Left foot pain 06/22/2021   Right knee pain 07/01/2021   Complex tear of lateral meniscus of right knee as current injury    Loose body in knee, right knee    AMS (altered mental status) 05/02/2022   Deviated nasal septum 08/07/2019   Left acoustic neuroma (HCC) 01/01/2020   OSA (obstructive sleep apnea) 08/08/2017   Poison ivy 05/24/2022   Chronic bilateral low back pain with left-sided sciatica 11/23/2022   Neuropathy 11/23/2022   Acute non-recurrent maxillary sinusitis 12/06/2022   Lumbar degenerative disc disease 12/28/2022   Severe left groin pain 12/28/2022   Status post bilateral inguinal hernia repair 12/28/2022   Chronic pain syndrome 12/28/2022   Lumbar facet arthropathy 12/28/2022   Chemotherapy-induced neuropathy (HCC) 12/28/2022  Resolved Ambulatory Problems    Diagnosis Date Noted   Smoking 12/01/2011   Sinusitis 07/15/2019   Colon cancer screening 06/11/2020   Abdominal pain 11/12/2020   Left testicular pain 10/14/2021   Non-recurrent bilateral inguinal hernia without obstruction or gangrene 01/14/2021   Acute non-recurrent pansinusitis 08/03/2022   Past Medical History:  Diagnosis Date   Adrenal insufficiency (Addison's disease) (HCC)    Allergy    COVID-19    Esophageal cancer (HCC) 10/21/2016   GERD (gastroesophageal reflux disease)    Hyperglycemia    Sleep apnea    Thyroid disease    Tobacco abuse    Constitutional Exam  General appearance: Well nourished, well developed, and well hydrated. In no apparent acute distress Vitals:   12/28/22 1134  BP: (!) 145/88  Pulse: 65  Resp: 18  Temp: 99.7 F (37.6 C)  TempSrc: Temporal  SpO2: 97%  Weight: 240 lb (108.9 kg)  Height: 5\' 8"  (1.727 m)   BMI Assessment:  Estimated body mass index is 36.49 kg/m as calculated from the following:   Height as of this encounter: 5\' 8"  (1.727 m).   Weight as of this encounter: 240 lb (108.9 kg).  BMI interpretation table: BMI level Category Range association with higher incidence of chronic pain  <18 kg/m2 Underweight   18.5-24.9 kg/m2 Ideal body weight   25-29.9 kg/m2 Overweight Increased incidence by 20%  30-34.9 kg/m2 Obese (Class I) Increased incidence by 68%  35-39.9 kg/m2 Severe obesity (Class II) Increased incidence by 136%  >40 kg/m2 Extreme obesity (Class III) Increased incidence by 254%   Patient's current BMI Ideal Body weight  Body mass index is 36.49 kg/m. Ideal body weight: 68.4 kg (150 lb 12.7 oz) Adjusted ideal body weight: 84.6 kg (186 lb 7.6 oz)   BMI Readings from Last 4 Encounters:  12/28/22 36.49 kg/m  12/06/22 38.62 kg/m  11/29/22 38.65 kg/m  11/23/22 39.17 kg/m   Wt Readings from Last 4 Encounters:  12/28/22 240 lb (108.9 kg)  12/06/22 254 lb (115.2 kg)  11/29/22 254 lb 3.2 oz (115.3 kg)  11/23/22 257 lb 9.6 oz (116.8 kg)    Psych/Mental status: Alert, oriented x 3 (person, place, & time)       Eyes: PERLA Respiratory: No evidence of acute respiratory distress  Thoracic Spine Area Exam  Skin & Axial Inspection: No masses, redness, or swelling Alignment: Symmetrical Functional ROM: Pain restricted ROM Stability: No instability detected Muscle Tone/Strength: Functionally intact. No obvious neuro-muscular anomalies detected. Sensory (Neurological): Musculoskeletal pain pattern Muscle strength & Tone: No palpable anomalies  Lumbar Spine Area Exam  Skin & Axial Inspection: No masses, redness, or swelling Alignment: Symmetrical Functional ROM: Pain restricted ROM affecting both sides Stability: No instability detected Muscle Tone/Strength: Functionally intact. No obvious neuro-muscular anomalies detected. Sensory (Neurological): Dermatomal pain pattern, neurogenic,  MSK Palpation: No palpable anomalies        Ambulation: Unassisted Gait: Relatively normal for age and body habitus Posture: WNL  Lower Extremity Exam    Side: Right lower extremity  Side: Left lower extremity  Stability: No instability observed          Stability: No instability observed          Skin & Extremity Inspection: Skin color, temperature, and hair growth are WNL. No peripheral edema or cyanosis. No masses, redness, swelling, asymmetry, or associated skin lesions. No contractures.  Skin & Extremity Inspection: Skin color, temperature, and hair growth are WNL. No peripheral edema or cyanosis. No masses,  redness, swelling, asymmetry, or associated skin lesions. No contractures.  Functional ROM: Unrestricted ROM                  Functional ROM: Unrestricted ROM                  Muscle Tone/Strength: Functionally intact. No obvious neuro-muscular anomalies detected.  Muscle Tone/Strength: Functionally intact. No obvious neuro-muscular anomalies detected.  Sensory (Neurological): Unimpaired        Sensory (Neurological):  left groin pain         DTR: Patellar: deferred today Achilles: deferred today Plantar: deferred today  DTR: Patellar: deferred today Achilles: deferred today Plantar: deferred today  Palpation: No palpable anomalies  Palpation: No palpable anomalies    Assessment  Primary Diagnosis & Pertinent Problem List: The primary encounter diagnosis was Chronic bilateral low back pain with left-sided sciatica. Diagnoses of Lumbar degenerative disc disease, Severe left groin pain, Status post bilateral inguinal hernia repair, Lumbar facet arthropathy, Chemotherapy-induced neuropathy (HCC), Chronic pain syndrome, and Genitofemoral neuralgia of left side were also pertinent to this visit.  Visit Diagnosis (New problems to examiner): 1. Chronic bilateral low back pain with left-sided sciatica   2. Lumbar degenerative disc disease   3. Severe left groin pain   4. Status post  bilateral inguinal hernia repair   5. Lumbar facet arthropathy   6. Chemotherapy-induced neuropathy (HCC)   7. Chronic pain syndrome   8. Genitofemoral neuralgia of left side    Plan of Care (Initial workup plan)  Recommend lumbar MRI for further workup of the patient's lumbar spine pain.  Lumbar spine x-rays reveal lumbar degenerative disc disease, facet arthropathy and lumbar spondylolisthesis.  Future considerations could include diagnostic lumbar facet medial branch nerve blocks and/or possible lumbar epidural steroid injection depending upon MRI results.  Also recommend that he restart/retrial gabapentin as below.  For his left groin pain, I believe that this is related to his previous hernia surgery and he is likely experiencing genitofemoral neuralgia on the left side.  We have discussed a left groin genitofemoral nerve block.  We will wait to see what his lumbar MRI shows and how he does with gabapentin before proceeding with this.  Patient also could benefit from spinal cord stimulator trial for chemotherapy-induced neuropathy affecting his lower extremities.  Imaging Orders         MR LUMBAR SPINE WO CONTRAST       Pharmacotherapy (current): Medications ordered:  Meds ordered this encounter  Medications   gabapentin (NEURONTIN) 300 MG capsule    Sig: Take 1 capsule (300 mg total) by mouth at bedtime for 15 days, THEN 1 capsule (300 mg total) 2 (two) times daily.    Dispense:  105 capsule    Refill:  0   Medications administered during this visit: Kyaw Zarn. Mcdaniel had no medications administered during this visit.    Interventional management options: Jose Hayes was informed that there is no guarantee that he would be a candidate for interventional therapies. The decision will be based on the results of diagnostic studies, as well as Jose Hayes risk profile.  Procedure(s) under consideration:  Lumbar facet medial branch nerve blocks Lumbar epidural steroid  injection Left genitofemoral nerve block under ultrasound guidance    Provider-requested follow-up: Return in about 5 weeks (around 02/01/2023) for after L-MRi and to assess gabapentin.  Future Appointments  Date Time Provider Department Center  05/27/2023 10:00 AM Glori Luis, MD [facility]  11/07/2023 10:30  AM [facility] ANNUAL WELLNESS VISIT [facility]    Duration of encounter: .  Total time on encounter, as per AMA guidelines included both the face-to-face and non-face-to-face time personally spent by the physician and/or other qualified health care professional(s) on the day of the encounter (includes time in activities that require the physician or other qualified health care professional and does not include time in activities normally performed by clinical staff). Physician's time may include the following activities when performed: Preparing to see the patient (e.g., pre-charting review of records, searching for previously ordered imaging, lab work, and nerve conduction tests) Review of prior analgesic pharmacotherapies. Reviewing PMP Interpreting ordered tests (e.g., lab work, imaging, nerve conduction tests) Performing post-procedure evaluations, including interpretation of diagnostic procedures Obtaining and/or reviewing separately obtained history Performing a medically appropriate examination and/or evaluation Counseling and educating the patient/family/caregiver Ordering medications, tests, or procedures Referring and communicating with other health care professionals (when not separately reported) Documenting clinical information in the electronic or other health record Independently interpreting results (not separately reported) and communicating results to the patient/ family/caregiver Care coordination (not separately reported)  Note by: Edward Jolly, MD (TTS technology used. I apologize for any typographical errors that were not detected and  corrected.) Date: 12/28/2022; Time: 1:27 PM

## 2022-12-28 NOTE — Patient Instructions (Signed)
You will have MRI completed prior to next appt. with pain clinic.

## 2022-12-28 NOTE — Progress Notes (Signed)
Safety precautions to be maintained throughout the outpatient stay will include: orient to surroundings, keep bed in low position, maintain call bell within reach at all times, provide assistance with transfer out of bed and ambulation.  

## 2022-12-28 NOTE — Telephone Encounter (Signed)
The patient is scheduled for the MRI on 8/26 and he will need valium called out

## 2022-12-28 NOTE — Telephone Encounter (Signed)
Patient notified to take his Xanax that he has already been prescribed.  Patient states understanding.

## 2022-12-29 ENCOUNTER — Telehealth: Payer: Self-pay | Admitting: Cardiovascular Disease

## 2022-12-29 DIAGNOSIS — G4730 Sleep apnea, unspecified: Secondary | ICD-10-CM | POA: Diagnosis not present

## 2022-12-29 DIAGNOSIS — G4733 Obstructive sleep apnea (adult) (pediatric): Secondary | ICD-10-CM | POA: Diagnosis not present

## 2022-12-29 NOTE — Telephone Encounter (Signed)
Patient called with concerns of bradycardia. Patient stated that he has been having resting heart rates in the 40's and is very concerned as he has been on immunotherapy and received new Rx for anxiety. Patient stated that with the bradycardic episodes he has been experiencing fatigue and some numbness. Patient scheduled for Thursday 12/30/22 at 9:00 AM

## 2022-12-29 NOTE — Telephone Encounter (Signed)
Please let the patient know that I have reviewed the form for the hot tub. I would defer this to his pain specialist to determine if this is appropriate to treat his pain. We can send the form to them if the patient would like.

## 2022-12-29 NOTE — Telephone Encounter (Signed)
STAT if HR is under 50 or over 120 (normal HR is 60-100 beats per minute)  What is your heart rate? 45  Do you have a log of your heart rate readings (document readings)?   Do you have any other symptoms? Patient has HR of high 40's when he wakes up.  Feels fatigue and over all not feeling well. Left MyChart message.

## 2022-12-30 ENCOUNTER — Ambulatory Visit: Payer: Medicare HMO | Attending: Cardiology | Admitting: Cardiology

## 2022-12-30 ENCOUNTER — Other Ambulatory Visit (INDEPENDENT_AMBULATORY_CARE_PROVIDER_SITE_OTHER): Payer: Self-pay

## 2022-12-30 ENCOUNTER — Ambulatory Visit: Payer: Medicare HMO

## 2022-12-30 ENCOUNTER — Encounter: Payer: Self-pay | Admitting: Cardiology

## 2022-12-30 VITALS — BP 134/81 | HR 56 | Ht 68.0 in | Wt 240.2 lb

## 2022-12-30 DIAGNOSIS — I4902 Ventricular flutter: Secondary | ICD-10-CM | POA: Diagnosis not present

## 2022-12-30 DIAGNOSIS — E2740 Unspecified adrenocortical insufficiency: Secondary | ICD-10-CM

## 2022-12-30 DIAGNOSIS — R079 Chest pain, unspecified: Secondary | ICD-10-CM

## 2022-12-30 DIAGNOSIS — I10 Essential (primary) hypertension: Secondary | ICD-10-CM

## 2022-12-30 DIAGNOSIS — R001 Bradycardia, unspecified: Secondary | ICD-10-CM | POA: Diagnosis not present

## 2022-12-30 DIAGNOSIS — I471 Supraventricular tachycardia, unspecified: Secondary | ICD-10-CM

## 2022-12-30 HISTORY — DX: Bradycardia, unspecified: R00.1

## 2022-12-30 NOTE — Telephone Encounter (Signed)
Patient seen in office by Dr. Lavell Anchors 12/30/22.

## 2022-12-30 NOTE — Patient Instructions (Signed)
Medication Instructions:  Once your heart monitor arrives and you're wearing it and please Take your Amlodipine once daily for the first 3 days then take twice daily for the last four days you're wearing the monitor.   *If you need a refill on your cardiac medications before your next appointment, please call your pharmacy*   Lab Work: none If you have labs (blood work) drawn today and your tests are completely normal, you will receive your results only by: MyChart Message (if you have MyChart) OR A paper copy in the mail If you have any lab test that is abnormal or we need to change your treatment, we will call you to review the results.   Testing/Procedures: Your physician has recommended that you wear a Zio monitor 7 days.  Once your heart monitor arrives and you're wearing it and please Take your Amlodipine once daily for the first 3 days then take twice daily for the last four days you're wearing the monitor.  This monitor is a medical device that records the heart's electrical activity. Doctors most often use these monitors to diagnose arrhythmias. Arrhythmias are problems with the speed or rhythm of the heartbeat. The monitor is a small device applied to your chest. You can wear one while you do your normal daily activities. While wearing this monitor if you have any symptoms to push the button and record what you felt. Once you have worn this monitor for the period of time provider prescribed 7 days, you will return the monitor device in the postage paid box. Once it is returned they will download the data collected and provide [place] with a report which the provider will then review and we will call you with those results. Important tips:  Avoid showering during the first 24 hours of wearing the monitor. Avoid excessive sweating to help maximize wear time. Do not submerge the device, no hot tubs, and no swimming pools. Keep any lotions or oils away from the patch. After 24 hours you may  shower with the patch on. Take brief showers with your back facing the shower head.  Do not remove patch once it has been placed because that will interrupt data and decrease adhesive wear time. Push the button when you have any symptoms and write down what you were feeling. Once you have completed wearing your monitor, remove and place into box which has postage paid and place in your outgoing mailbox.  If for some reason you have misplaced your box then call our office and we can provide another box and/or mail it off for you.      Follow-Up: At [facility], you and your health needs are our priority.  As part of our continuing mission to provide you with exceptional heart care, we have created designated Provider Care Teams.  These Care Teams include your primary Cardiologist (physician) and Advanced Practice Providers (APPs -  Physician Assistants and Nurse Practitioners) who all work together to provide you with the care you need, when you need it.  We recommend signing up for the patient portal called "MyChart".  Sign up information is provided on this After Visit Summary.  MyChart is used to connect with patients for Virtual Visits (Telemedicine).  Patients are able to view lab/test results, encounter notes, upcoming appointments, etc.  Non-urgent messages can be sent to your provider as well.   To learn more about what you can do with MyChart, go to [email or web address].    Your next appointment:  1 month(s)  Provider:   Julien Nordmann, MD

## 2022-12-30 NOTE — Progress Notes (Signed)
Cardiology Office Note:  .   Date:  01/01/2023  ID:  Jose Hayes, DOB April 25, 1961, MRN [id number] PCP: Glori Luis, MD  [facility] Providers Cardiologist:  Julien Nordmann, MD     Chief Complaint  Patient presents with   Acute Visit    Pt c/o bradycardia, some dizziness, fatigue and vison blurry.  Meds reviewed.     History of Present Illness: Jose Hayes Kitchen     Jose Hayes is a very pleasant obese 62 y.o. male former smoker with a PMH notable for HTN, history of SVT, Addison's disease (developed adrenal insufficiency and thyroid disease following chemotherapy for stomach cancer), HTN HLD, Thyroid disease, and chronic pain who presents here for BRADYCARDIA at the request of Glori Luis, MD.  11/05/2011-presented to [facility] with SVT treated with adenosine in the ER.  Jose Hayes was just seen on 11/29/2022 by Dr. Mariah Milling: He denied any arrhythmias.  Lipids at goal.  Stable from a cancer standpoint.  BP was a little high in the morning, increased amlodipine to 5 mg twice daily and considered ARB.  Not on a beta-blocker.  We contacted the office on 12/29/2022 with concerns of bradycardia noting heart rates in the 40s.  Noted fatigue.  Scheduled for visit.    Subjective  INTERVAL HISTORY GOLDMAN CULLOM presents here as a DOD patient work-in for complaints of dizziness and bradycardia.  He was just seen by Dr. Mariah Milling with plans to increase amlodipine to 5 mg twice daily.  He was doing fine with no recurrent episodes of SVT.  Otherwise feeling fine according to the note. He tells me that he has just been extremely anxious and nervous.  He is having issues still with the medications that he takes for his Addison's disease.  He really has been struggling with steroids with continued jitteriness and fatigue.  He is just extremely anxious nervous and with that he does not eat well or drink well.  He tries to drink but without eating probably does not retain much.  He  went on a long story about his history and all the stress involved.  He says that he has been in "bad shape ever since the the GI cancer.  He has been seen by a new psychiatrist and was started on Lexapro but started feeling weird but that not tolerating it.  He is requiring.  Was consistent PRN Xanax and the hope was that Lexapro would improve.  But, she has not been taking it because of concerns for side effects. Over the last week or so he has been noticing fatigue and dizziness and has noticed that his heart rate has been on occasion in the high 40s at 48 to 50 bpm and his blood pressure has been more elevated.  He thought that all of these conditions may have had something to do with changing his amlodipine Strader therefore is actually stopped taking the second dose of amlodipine.  ROS:  Cardiovascular ROS: positive for - dyspnea on exertion and fatigue.  Dizziness and lightheadedness associated with Low heart rates with high blood pressures.  He is quite deconditioned and therefore clearly has fatigue.. negative for - chest pain, irregular heartbeat, orthopnea, paroxysmal nocturnal dyspnea, shortness of breath, or syncope or near syncope Review of Systems - General ROS: Positive for generalized fatigue and weakness.  Dizziness.  Significant anxiety. Psychological ROS: positive for - anxiety, depression, sleep disturbances, and poor appetite, anhedonia Gastrointestinal ROS: Poor p.o. intake, nausea. Musculoskeletal ROS: Diffuse  musculoskeletal complaints.    Objective   Studies Reviewed: Jose Hayes Kitchen   EKG Interpretation Date/Time:  Thursday December 30 2022 09:43:05 EDT Ventricular Rate:  56 PR Interval:  202 QRS Duration:  104 QT Interval:  436 QTC Calculation: 420 R Axis:   61  Text Interpretation: Sinus bradycardia When compared with ECG of 29-Nov-2022 10:27, No significant change was found Rate slower Confirmed by Bryan Lemma ([id number]) on 01/01/2023 11:16:35 AM   N/a  Risk  Assessment/Calculations:         Physical Exam:   VS:  BP 134/81 (BP Location: Left Arm, Patient Position: Sitting, Cuff Size: Normal)   Pulse (!) 56   Ht 5\' 8"  (1.727 m)   Wt 240 lb 3.2 oz (109 kg)   SpO2 98%   BMI 36.52 kg/m    Wt Readings from Last 3 Encounters:  12/30/22 240 lb 3.2 oz (109 kg)  12/28/22 240 lb (108.9 kg)  12/06/22 254 lb (115.2 kg)  --> not eating 2/2 anxiety Drinking water & Gatorade Zero Orthostatic VS for the past 24 hrs (Last 3 readings):  BP- Lying Pulse- Lying BP- Sitting Pulse- Sitting BP- Standing at 0 minutes Pulse- Standing at 0 minutes BP- Standing at 3 minutes Pulse- Standing at 3 minutes  12/30/22 0945 134/81 52 143/83 56 143/86 61 143/86 63     GEN: Well nourished, well developed in no acute distress; obese; quite anxious NECK: No JVD; No carotid bruits CARDIAC: Normal S1, S2; RRR, no murmurs, rubs, gallops RESPIRATORY:  Clear to auscultation without rales, wheezing or rhonchi ; nonlabored, good air movement. ABDOMEN: Soft, non-tender, non-distended EXTREMITIES:  No edema; No deformity   EKG Interpretation Date/Time:  Thursday December 30 2022 09:43:05 EDT Ventricular Rate:  56 PR Interval:  202 QRS Duration:  104 QT Interval:  436 QTC Calculation: 420 R Axis:   61  Text Interpretation: Sinus bradycardia When compared with ECG of 29-Nov-2022 10:27, No significant change was found Rate slower Confirmed by Bryan Lemma ([id number]) on 01/01/2023 11:16:35 AM       ASSESSMENT AND PLAN: .    Problem List Items Addressed This Visit       Cardiology Problems   SVT (supraventricular tachycardia) (Chronic)    No recent arrhythmias.  Not requiring rate control agents.      Relevant Orders   EKG 12-Lead (Completed)   LONG TERM MONITOR (3-14 DAYS)   Sinus bradycardia    Patient notes heart rates down in the 40s and 50s which end of the summer not overly concerning, they are associated with his blood pressure being higher.  He has the impression  that may be related to Norvasc, but I think Norvasc would actually help his blood pressure not necessarily affect heart rate.  However can assess.  Plan: 7-day Zio patch.  First 3 days without second dose of amlodipine, last 4 days with dose amlodipine.      Relevant Orders   LONG TERM MONITOR (3-14 DAYS)   Hypertension - Primary (Chronic)    3 hypotension today.  Plan was to be on 5 mg twice daily amlodipine which we will try to reinitiate but assess his heart rate responsiveness first.  The plan would be to add an ARB responsiveness to changes to increase to 5 mg twice daily amlodipine        Other   Adrenal insufficiency (HCC) (Chronic)    Managed by endocrinology.  Did not this along with his GI cancer concerns really symptoms triggered  his high level of anxiety.      Other Visit Diagnoses     Chest pain of uncertain etiology       Relevant Orders   LONG TERM MONITOR (3-14 DAYS)   Heart flutter, ventricular (HCC)       Relevant Orders   LONG TERM MONITOR (3-14 DAYS)       Most of this visit was allowing him to talk and then tried to reassure him that the heart rate issues were not overly concerning but we will evaluate with a marker.  We do need his blood pressure down and I am not certain that amlodipine is playing a role but we will test by having him wear the monitor with a second dose of amlodipine.  He will follow-up with Dr. Freida Busman.        Dispo: Return in about 1 month (around 01/30/2023) for With Dr. Mariah Milling.  Total time spent: 18 min spent with patient + 13 min spent charting = 31 min     Signed, Marykay Lex, MD, MS Bryan Lemma, M.D., M.S. Interventional Cardiologist  [facility]  Pager # [phone] Phone # [phone] [address] [place], [place] [address]

## 2023-01-01 ENCOUNTER — Encounter: Payer: Self-pay | Admitting: Family Medicine

## 2023-01-01 ENCOUNTER — Encounter: Payer: Self-pay | Admitting: Student in an Organized Health Care Education/Training Program

## 2023-01-01 NOTE — Assessment & Plan Note (Signed)
Managed by endocrinology.  Did not this along with his GI cancer concerns really symptoms triggered his high level of anxiety.

## 2023-01-01 NOTE — Assessment & Plan Note (Signed)
No recent arrhythmias.  Not requiring rate control agents.

## 2023-01-01 NOTE — Assessment & Plan Note (Signed)
Patient notes heart rates down in the 40s and 50s which end of the summer not overly concerning, they are associated with his blood pressure being higher.  He has the impression that may be related to Norvasc, but I think Norvasc would actually help his blood pressure not necessarily affect heart rate.  However can assess.  Plan: 7-day Zio patch.  First 3 days without second dose of amlodipine, last 4 days with dose amlodipine.

## 2023-01-01 NOTE — Assessment & Plan Note (Addendum)
3 hypotension today.  Plan was to be on 5 mg twice daily amlodipine which we will try to reinitiate but assess his heart rate responsiveness first.  The plan would be to add an ARB responsiveness to changes to increase to 5 mg twice daily amlodipine

## 2023-01-03 ENCOUNTER — Ambulatory Visit: Payer: Medicare HMO

## 2023-01-03 ENCOUNTER — Encounter: Payer: Self-pay | Admitting: Cardiovascular Disease

## 2023-01-07 ENCOUNTER — Ambulatory Visit: Payer: Medicare HMO | Admitting: Cardiovascular Disease

## 2023-01-12 ENCOUNTER — Telehealth: Payer: Self-pay | Admitting: Cardiovascular Disease

## 2023-01-12 DIAGNOSIS — R079 Chest pain, unspecified: Secondary | ICD-10-CM | POA: Diagnosis not present

## 2023-01-12 DIAGNOSIS — R001 Bradycardia, unspecified: Secondary | ICD-10-CM

## 2023-01-12 DIAGNOSIS — R4589 Other symptoms and signs involving emotional state: Secondary | ICD-10-CM | POA: Diagnosis not present

## 2023-01-12 DIAGNOSIS — F1320 Sedative, hypnotic or anxiolytic dependence, uncomplicated: Secondary | ICD-10-CM | POA: Diagnosis not present

## 2023-01-12 NOTE — Telephone Encounter (Signed)
Irving Burton from [facility] called and reported a symptomatc bradycardia. Per Irving Burton heart rate of 38 that lasted for 30 seconds. Irving Burton reported that it is strip 3 on page 19 of the report. Report printed and given Dr. Mariah Milling for review. Per Dr. Mariah Milling can order an EP referral.  Called patient and informed patient of preliminary report and Dr. Windell Hummingbird recommendations. Patient states that he would like a referral to EP. Referral placed for EP.

## 2023-01-12 NOTE — Telephone Encounter (Signed)
Caller Jose Hayes) reporting abnormal results.

## 2023-01-13 ENCOUNTER — Encounter: Payer: Self-pay | Admitting: Cardiovascular Disease

## 2023-01-13 DIAGNOSIS — F411 Generalized anxiety disorder: Secondary | ICD-10-CM | POA: Diagnosis not present

## 2023-01-26 ENCOUNTER — Encounter: Payer: Self-pay | Admitting: Nurse Practitioner

## 2023-01-28 ENCOUNTER — Encounter: Payer: Self-pay | Admitting: Family Medicine

## 2023-01-28 ENCOUNTER — Ambulatory Visit (INDEPENDENT_AMBULATORY_CARE_PROVIDER_SITE_OTHER): Payer: Medicare HMO | Admitting: Family Medicine

## 2023-01-28 VITALS — BP 124/78 | HR 56 | Temp 97.8°F | Ht 68.0 in | Wt 242.0 lb

## 2023-01-28 DIAGNOSIS — J0100 Acute maxillary sinusitis, unspecified: Secondary | ICD-10-CM

## 2023-01-28 DIAGNOSIS — R001 Bradycardia, unspecified: Secondary | ICD-10-CM

## 2023-01-28 MED ORDER — DOXYCYCLINE HYCLATE 100 MG PO TABS
100.0000 mg | ORAL_TABLET | Freq: Two times a day (BID) | ORAL | 0 refills | Status: AC
Start: 2023-01-28 — End: 2023-02-07

## 2023-01-28 NOTE — Assessment & Plan Note (Signed)
Chronic issue.  Patient will follow with cardiology for this.

## 2023-01-28 NOTE — Assessment & Plan Note (Signed)
Patient with ongoing issues with sinusitis not fully responsive to Augmentin.  Will start on doxycycline 100 mg twice daily for 10 days.  Advised of risk of diarrhea and skin sensitivity to the sun.  If not improving by day 4 or 5 of antibiotics he will let us know we will consider alternative treatments.

## 2023-01-28 NOTE — Progress Notes (Signed)
[name], MD Phone: [phone]  Jose Hayes is a 62 y.o. male who presents today for f/u.  Sinusitis: Patient reports continued sinus congestion after treatment with Augmentin back in early August.  Notes he improved some with that though the congestion never really cleared up.  He continues to blow green mucus out of his nose.  He notes its his right maxillary and frontal sinuses.  He notes no fevers.  No postnasal drip.  Sinus bradycardia: Had a heart monitor through cardiology that revealed bradycardia into the 30s.  Cardiology is monitoring this at this time and noted he does not need a pacemaker currently.  Social History   Tobacco Use  Smoking Status Former   Current packs/day: 0.50   Average packs/day: 0.5 packs/day for 25.0 years (12.5 ttl pk-yrs)   Types: Cigarettes  Smokeless Tobacco Never  Tobacco Comments   trying gum to quit    Current Outpatient Medications on File Prior to Visit  Medication Sig Dispense Refill   ALPRAZolam (XANAX) 1 MG tablet Take 1 mg by mouth 3 (three) times daily as needed for anxiety.     amLODipine (NORVASC) 5 MG tablet Take 1 tablet (5 mg total) by mouth 2 (two) times daily. 180 tablet 3   gabapentin (NEURONTIN) 300 MG capsule Take 1 capsule (300 mg total) by mouth at bedtime for 15 days, THEN 1 capsule (300 mg total) 2 (two) times daily. 105 capsule 0   hydrocortisone (CORTEF) 10 MG tablet Take 10-15 mg by mouth See admin instructions. Take 1.5 tablets (15 mg) by mouth in the morning (1 hour after levothyroxine) & take 1 tablet (10 mg) by mouth in the afternoon at 2 pm.     hydrocortisone sodium succinate (SOLU-CORTEF) 100 MG injection Inject 2 mLs (100 mg total) into the muscle as needed. For adrenal crisis. 1 each 1   levothyroxine (SYNTHROID) 88 MCG tablet Take 88 mcg by mouth daily before breakfast.     rosuvastatin (CRESTOR) 20 MG tablet Take 20 mg by mouth daily.     sertraline (ZOLOFT) 25 MG tablet Take 1 tablet by mouth  daily.     testosterone cypionate (DEPOTESTOSTERONE CYPIONATE) 200 MG/ML injection Inject into the muscle every Monday. 0.28mls     No current facility-administered medications on file prior to visit.     ROS see history of present illness  Objective  Physical Exam Vitals:   01/28/23 0818  BP: 124/78  Pulse: (!) 56  Temp: 97.8 F (36.6 C)  SpO2: 99%    BP Readings from Last 3 Encounters:  01/28/23 124/78  12/30/22 134/81  12/28/22 (!) 145/88   Wt Readings from Last 3 Encounters:  01/28/23 242 lb (109.8 kg)  12/30/22 240 lb 3.2 oz (109 kg)  12/28/22 240 lb (108.9 kg)    Physical Exam Constitutional:      General: He is not in acute distress.    Appearance: He is not diaphoretic.  HENT:     Right Ear: Tympanic membrane normal.     Left Ear: Tympanic membrane normal.  Cardiovascular:     Rate and Rhythm: Normal rate and regular rhythm.     Heart sounds: Normal heart sounds.  Pulmonary:     Effort: Pulmonary effort is normal.     Breath sounds: Normal breath sounds.  Skin:    General: Skin is warm and dry.  Neurological:     Mental Status: He is alert.      Assessment/Plan: Please see individual problem  list.  Acute non-recurrent maxillary sinusitis Assessment & Plan: Patient with ongoing issues with sinusitis not fully responsive to Augmentin.  Will start on doxycycline 100 mg twice daily for 10 days.  Advised of risk of diarrhea and skin sensitivity to the sun.  If not improving by day 4 or 5 of antibiotics he will let us know we will consider alternative treatments.  Orders: -     Doxycycline Hyclate; Take 1 tablet (100 mg total) by mouth 2 (two) times daily for 10 days.  Dispense: 20 tablet; Refill: 0  Sinus bradycardia Assessment & Plan: Chronic issue.  Patient will follow with cardiology for this.     Return if symptoms worsen or fail to improve.   [name], MD [facility]

## 2023-01-29 DIAGNOSIS — G4733 Obstructive sleep apnea (adult) (pediatric): Secondary | ICD-10-CM | POA: Diagnosis not present

## 2023-01-29 DIAGNOSIS — G4730 Sleep apnea, unspecified: Secondary | ICD-10-CM | POA: Diagnosis not present

## 2023-02-11 DIAGNOSIS — E273 Drug-induced adrenocortical insufficiency: Secondary | ICD-10-CM | POA: Diagnosis not present

## 2023-02-11 DIAGNOSIS — R4589 Other symptoms and signs involving emotional state: Secondary | ICD-10-CM | POA: Diagnosis not present

## 2023-02-12 NOTE — Progress Notes (Unsigned)
Cardiology Office Note  Date:  02/14/2023   ID:  Jose Hayes, DOB 09-03-1960, MRN [id number]  PCP:  Glori Luis, MD   Chief Complaint  Patient presents with   Follow-up    1 month f/u pt would like to discuss HR/zio. Meds reviewed verbally with pt.    HPI:  Jose Hayes is a very pleasant 62 year-old gentleman with  Anxiety, PTSD obesity,  smoking history, quit  [facility] 11/05/2011 with SVT Rhythm broke to normal sinus rhythm in the emergency room. Adenosine may have worked Stomach cancer, completed chemotherapy, developed adrenal and thyroid issues He presents for routine followup of his arrhythmia/SVT, HTN  Last seen by myself in clinic July 2024 Seen by one of our providers December 30, 2022  He reports having difficulty managing his anxiety Seen by primary care, started on Zoloft for anxiety  Concerns since last clinic visit for bradycardia  Zio monitor reporting bradycardia, report pulled up and reviewed min HR of 34 bpm, max HR of 112 bpm, and avg HR of 53 bpm. Predominant underlying rhythm was Sinus Rhythm. Isolated SVEs were rare (<1.0%), and no SVE Couplets or SVE Triplets were present. Isolated VEs were rare (<1.0%), and no VE Couplets or VE Triplets were present.  Followed by endocrine for adrenal insufficiency, primary hypothyroidism, secondary hypogonadism  EKG personally reviewed by myself on todays visit EKG Interpretation Date/Time:  Monday February 14 2023 14:14:06 EDT Ventricular Rate:  78 PR Interval:  176 QRS Duration:  102 QT Interval:  372 QTC Calculation: 424 R Axis:   86  Text Interpretation: Normal sinus rhythm Normal ECG When compared with ECG of 30-Dec-2022 09:43, No significant change was found Confirmed by Julien Nordmann [phone]) on 02/14/2023 2:19:31 PM   Prior records reviewed In the hospital December 2023 acute metabolic encephalopathy in the setting of viral influenza requiring stress steroids for adrenal crisis  Now retired from  [employer], on disability given chronic health issues  Prior imaging CT head Mild chronic microvascular ischemic changes in the cerebral white matter, as above. Large mucosal retention cysts or polyps in the maxillary sinuses bilaterally (right greater than left).  MRI Large mucous retention cysts in the bilateral maxillary sinuses. Mild mucosal thickening in the ethmoid air cells and frontal sinuses.  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  Echocardiogram was essentially normal with normal ejection fraction, normal biventricular systolic pressures.   PMH:   has a past medical history of Adrenal insufficiency (Addison's disease) (HCC), Allergy, Anxiety, COVID-19, Esophageal cancer (HCC) (10/21/2016), GERD (gastroesophageal reflux disease), Hyperglycemia, Hyperlipidemia, Hypertension, Left acoustic neuroma (HCC), Sleep apnea, SVT (supraventricular tachycardia) (HCC), Thyroid disease, and Tobacco abuse.  PSH:    Past Surgical History:  Procedure Laterality Date   COLONOSCOPY     KNEE ARTHROSCOPY Right 07/30/2021   Procedure: right knee arthroscopy, debridement, removal loose body;  Surgeon: Cammy Copa, MD;  Location: [facility] OR;  Service: Orthopedics;  Laterality: Right;   LAPAROSCOPIC CHOLECYSTECTOMY  1991   SHOULDER SURGERY Right 1985, 1991, 2000   left x3   [place] teeth  extraction     UPPER GASTROINTESTINAL ENDOSCOPY      Current Outpatient Medications  Medication Sig Dispense Refill   ALPRAZolam (XANAX) 1 MG tablet Take 1 mg by mouth 3 (three) times daily as needed for anxiety.     amLODipine (NORVASC) 5 MG tablet Take 1 tablet (5 mg total) by mouth 2 (two) times daily. 180 tablet 3   gabapentin (NEURONTIN) 300 MG capsule Take 1 capsule (300 mg total) by mouth at bedtime for 15 days, THEN 1 capsule (300 mg total) 2 (two) times daily. 105 capsule 0   hydrocortisone (CORTEF) 10 MG tablet Take 10-15 mg by mouth See admin instructions. Take 1.5 tablets (15 mg) by mouth in the morning (1 hour after levothyroxine) & take 1 tablet (10 mg) by mouth in the afternoon at 2 pm.     hydrocortisone sodium succinate (SOLU-CORTEF) 100 MG injection Inject 2 mLs (100 mg total) into the muscle as needed. For adrenal crisis. 1 each 1   levothyroxine (SYNTHROID) 88 MCG tablet Take 88 mcg by mouth daily before breakfast.     rosuvastatin (CRESTOR) 20 MG tablet Take 20 mg by mouth daily.     sertraline (ZOLOFT) 25 MG tablet Take 75 mg by mouth daily.     testosterone cypionate (DEPOTESTOSTERONE CYPIONATE) 200 MG/ML injection Inject into the muscle every Monday. 0.68mls     No current facility-administered medications for this visit.    Allergies:   Patient has no known allergies.   Social History:  The patient  reports that he has quit smoking. His smoking use included cigarettes. He has a 12.5 pack-year smoking history. He has never used smokeless tobacco. He reports that he does not currently use alcohol. He reports that he does not use drugs.   Family History:   family history includes Clotting disorder in his father; Hypertension in his father and mother; Pancreatic cancer in his father.   Review of Systems: Review of Systems  Constitutional: Negative.   HENT: Negative.    Respiratory: Negative.    Cardiovascular: Negative.   Gastrointestinal: Negative.    Musculoskeletal: Negative.   Neurological: Negative.   Psychiatric/Behavioral: Negative.    All other systems reviewed and are negative.  PHYSICAL EXAM: VS:  BP 118/74 (BP Location: Left Arm, Patient Position: Sitting, Cuff Size: Large)   Pulse 78   Ht 5\' 8"  (1.727 m)   Wt 243 lb 8 oz (110.5 kg)   SpO2 97%   BMI 37.02 kg/m  , BMI Body mass index is 37.02 kg/m. Constitutional:  oriented to person, place, and time. No distress.  HENT:  Head: Grossly normal Eyes:  no discharge. No scleral icterus.  Neck: No JVD, no carotid bruits  Cardiovascular: Regular rate and rhythm, no murmurs appreciated Pulmonary/Chest: Clear to auscultation bilaterally, no wheezes or rails Abdominal: Soft.  no distension.  no tenderness.  Musculoskeletal: Normal range of motion Neurological:  normal muscle tone. Coordination normal. No atrophy Skin: Skin warm and dry Psychiatric: normal affect, pleasant   Recent Labs: 05/03/2022: TSH 0.664 05/04/2022: Hemoglobin 14.0; Magnesium 2.8; Platelets 162 11/23/2022: ALT 25; BUN 18; Creatinine, Ser 1.14; Potassium 4.1; Sodium 139    Lipid Panel Lab Results  Component Value Date   CHOL 129 11/23/2022   HDL 39.30 11/23/2022   LDLCALC 76 11/23/2022   TRIG 69.0 11/23/2022    Wt Readings from Last 3 Encounters:  02/14/23 243 lb 8 oz (110.5 kg)  01/28/23 242 lb (109.8 kg)  12/30/22 240 lb 3.2 oz (109 kg)     ASSESSMENT AND PLAN:  SVT (supraventricular tachycardia) (HCC) Denies paroxysmal tachycardia Currently not on rate controlling medications given history of bradycardia  Bradycardia Zio monitor showing bradycardia, results discussed Asymptomatic, stable blood pressure He will continue to monitor at home  Mixed hyperlipidemia Continue Crestor 20 daily Cholesterol at goal  Stomach cancer Treated at [facility], clinical trial Completed his treatment, now in remission Reports no recurrence, imaging pulled up and reviewed  Essential  hypertension Blood pressure is well controlled on today's visit. No changes made to the medications.  Smoking Quit years ago  Aortic atherosclerosis Minimal plaquing noted in the distal descending aorta Cholesterol at goal, non-smoker, A1c reasonable  Anxiety Managed by primary care Having difficulty recently, sertraline being titrated upwards   Total encounter time more than 30 minutes  Greater than 50% was spent in counseling and coordination of care with the patient   Orders Placed This Encounter  Procedures   EKG 12-Lead     Signed, Dossie Arbour, M.D., Ph.D. 02/14/2023  [facility] [address], [place] [phone]

## 2023-02-14 ENCOUNTER — Ambulatory Visit: Payer: Medicare HMO | Attending: Cardiovascular Disease | Admitting: Cardiovascular Disease

## 2023-02-14 ENCOUNTER — Encounter: Payer: Self-pay | Admitting: Cardiovascular Disease

## 2023-02-14 VITALS — BP 118/74 | HR 78 | Ht 68.0 in | Wt 243.5 lb

## 2023-02-14 DIAGNOSIS — R001 Bradycardia, unspecified: Secondary | ICD-10-CM | POA: Diagnosis not present

## 2023-02-14 DIAGNOSIS — F419 Anxiety disorder, unspecified: Secondary | ICD-10-CM

## 2023-02-14 DIAGNOSIS — E782 Mixed hyperlipidemia: Secondary | ICD-10-CM | POA: Diagnosis not present

## 2023-02-14 DIAGNOSIS — I471 Supraventricular tachycardia, unspecified: Secondary | ICD-10-CM | POA: Diagnosis not present

## 2023-02-14 DIAGNOSIS — E2740 Unspecified adrenocortical insufficiency: Secondary | ICD-10-CM | POA: Diagnosis not present

## 2023-02-14 DIAGNOSIS — I10 Essential (primary) hypertension: Secondary | ICD-10-CM | POA: Diagnosis not present

## 2023-02-14 NOTE — Patient Instructions (Signed)

## 2023-02-28 DIAGNOSIS — G4730 Sleep apnea, unspecified: Secondary | ICD-10-CM | POA: Diagnosis not present

## 2023-02-28 DIAGNOSIS — G4733 Obstructive sleep apnea (adult) (pediatric): Secondary | ICD-10-CM | POA: Diagnosis not present

## 2023-03-22 DIAGNOSIS — D333 Benign neoplasm of cranial nerves: Secondary | ICD-10-CM | POA: Diagnosis not present

## 2023-03-22 DIAGNOSIS — Z01818 Encounter for other preprocedural examination: Secondary | ICD-10-CM | POA: Diagnosis not present

## 2023-04-13 ENCOUNTER — Ambulatory Visit: Payer: Medicare HMO | Admitting: Cardiology

## 2023-04-15 DIAGNOSIS — G4733 Obstructive sleep apnea (adult) (pediatric): Secondary | ICD-10-CM | POA: Diagnosis not present

## 2023-04-15 DIAGNOSIS — G4752 REM sleep behavior disorder: Secondary | ICD-10-CM | POA: Diagnosis not present

## 2023-04-22 ENCOUNTER — Encounter: Payer: Self-pay | Admitting: Family Medicine

## 2023-04-22 DIAGNOSIS — Z1211 Encounter for screening for malignant neoplasm of colon: Secondary | ICD-10-CM

## 2023-04-22 NOTE — Telephone Encounter (Signed)
Can we get him scheduled so I can look at this to determine the next step?

## 2023-04-22 NOTE — Telephone Encounter (Signed)
 Patient is scheduled for 04/25/23.

## 2023-04-25 ENCOUNTER — Ambulatory Visit (INDEPENDENT_AMBULATORY_CARE_PROVIDER_SITE_OTHER): Payer: Medicare HMO | Admitting: Family Medicine

## 2023-04-25 ENCOUNTER — Encounter: Payer: Self-pay | Admitting: Family Medicine

## 2023-04-25 ENCOUNTER — Ambulatory Visit: Payer: Medicare HMO | Admitting: Family Medicine

## 2023-04-25 VITALS — BP 130/76 | HR 64 | Temp 97.8°F | Ht 68.0 in | Wt 241.6 lb

## 2023-04-25 DIAGNOSIS — F419 Anxiety disorder, unspecified: Secondary | ICD-10-CM

## 2023-04-25 DIAGNOSIS — L989 Disorder of the skin and subcutaneous tissue, unspecified: Secondary | ICD-10-CM | POA: Insufficient documentation

## 2023-04-25 DIAGNOSIS — Z23 Encounter for immunization: Secondary | ICD-10-CM | POA: Diagnosis not present

## 2023-04-25 DIAGNOSIS — R109 Unspecified abdominal pain: Secondary | ICD-10-CM | POA: Insufficient documentation

## 2023-04-25 NOTE — Assessment & Plan Note (Signed)
Chronic issue.  Lesion appears to be benign.  Referred to dermatology to consider removal.

## 2023-04-25 NOTE — Assessment & Plan Note (Signed)
Chronic issue.  Improved somewhat.  Patient will continue sertraline 100 mg daily.  Encouraged him to discuss possibly increasing this dose with his specialist at [facility].

## 2023-04-25 NOTE — Progress Notes (Signed)
[name], MD Phone: [phone]  Jose Hayes is a 62 y.o. male who presents today for f/u.  Skin lesion: Patient notes this is on the bridge of his nose.  It has been present for many years.  It has gotten bigger over the last 2 years.  Notes his CPAP mask contacts it and causes irritation.  He rarely bleeds.  No history of skin cancer.  Anxiety/depression: Patient notes this is actually better than it has been in the past.  He is on Zoloft 100 mg.  He feels that this is not quite enough though it has helped take the edge off.  He does see behavioral health at [facility].     04/25/2023   10:34 AM 01/28/2023    8:20 AM 12/28/2022   11:38 AM 11/23/2022   10:15 AM 11/02/2022   11:03 AM  Depression screen PHQ 2/9  Decreased Interest 2 0 0 0 0  Down, Depressed, Hopeless 1 0 1 0 0  PHQ - 2 Score 3 0 1 0 0  Altered sleeping 3 0  0 2  Tired, decreased energy 3 0  0 2  Change in appetite 1 0  0 0  Feeling bad or failure about yourself  1 0  0 0  Trouble concentrating 0 0  0 0  Moving slowly or fidgety/restless 1 0  0 0  Suicidal thoughts 0 0  0 0  PHQ-9 Score 12 0  0 4  Difficult doing work/chores Very difficult Not difficult at all  Not difficult at all Not difficult at all      04/25/2023   10:41 AM 01/28/2023    8:20 AM 11/23/2022   10:15 AM 05/24/2022   11:10 AM  GAD 7 : Generalized Anxiety Score  Nervous, Anxious, on Edge 2 0 0 0  Control/stop worrying 1 0 0 0  Worry too much - different things 2 0 0 0  Trouble relaxing 1 0 0 0  Restless 0 0 0 0  Easily annoyed or irritable 1 0 0 0  Afraid - awful might happen 0 0 0 0  Total GAD 7 Score 7 0 0 0  Anxiety Difficulty Very difficult Not difficult at all Not difficult at all Not difficult at all    Chronic flank pain: Patient notes this has been going on a year since he had his hernia repair.  He notes it occurs intermittently.  He followed up with his surgeon for this and they referred him to urology.  Patient reports  urology evaluation was negative.  He notes no dysuria hematuria.  He is having good bowel movements.  He notes at some point he was scheduled to have an MRI of his back though did not proceed with this given other medical issues.  Social History   Tobacco Use  Smoking Status Former   Current packs/day: 0.50   Average packs/day: 0.5 packs/day for 25.0 years (12.5 ttl pk-yrs)   Types: Cigarettes  Smokeless Tobacco Never  Tobacco Comments   trying gum to quit    Current Outpatient Medications on File Prior to Visit  Medication Sig Dispense Refill   ALPRAZolam (XANAX) 1 MG tablet Take 1 mg by mouth 3 (three) times daily as needed for anxiety.     amLODipine (NORVASC) 5 MG tablet Take 1 tablet (5 mg total) by mouth 2 (two) times daily. 180 tablet 3   hydrocortisone (CORTEF) 10 MG tablet Take 10-15 mg by mouth See admin instructions. Take 1.5  tablets (15 mg) by mouth in the morning (1 hour after levothyroxine) & take 1 tablet (10 mg) by mouth in the afternoon at 2 pm.     hydrocortisone sodium succinate (SOLU-CORTEF) 100 MG injection Inject 2 mLs (100 mg total) into the muscle as needed. For adrenal crisis. 1 each 1   levothyroxine (SYNTHROID) 88 MCG tablet Take 88 mcg by mouth daily before breakfast.     rosuvastatin (CRESTOR) 20 MG tablet Take 20 mg by mouth daily.     sertraline (ZOLOFT) 25 MG tablet Take 75 mg by mouth daily.     testosterone cypionate (DEPOTESTOSTERONE CYPIONATE) 200 MG/ML injection Inject into the muscle every Monday. 0.56mls     gabapentin (NEURONTIN) 300 MG capsule Take 1 capsule (300 mg total) by mouth at bedtime for 15 days, THEN 1 capsule (300 mg total) 2 (two) times daily. 105 capsule 0   No current facility-administered medications on file prior to visit.     ROS see history of present illness  Objective  Physical Exam Vitals:   04/25/23 1020  BP: 130/76  Pulse: 64  Temp: 97.8 F (36.6 C)  SpO2: 97%    BP Readings from Last 3 Encounters:  04/25/23  130/76  02/14/23 118/74  01/28/23 124/78   Wt Readings from Last 3 Encounters:  04/25/23 241 lb 9.6 oz (109.6 kg)  02/14/23 243 lb 8 oz (110.5 kg)  01/28/23 242 lb (109.8 kg)    Physical Exam Abdominal:     General: There is no distension.     Palpations: Abdomen is soft.     Tenderness: There is no abdominal tenderness.      Assessment/Plan: Please see individual problem list.  Skin lesion Assessment & Plan: Chronic issue.  Lesion appears to be benign.  Referred to dermatology to consider removal.  Orders: -     Ambulatory referral to Dermatology  Anxiety Assessment & Plan: Chronic issue.  Improved somewhat.  Patient will continue sertraline 100 mg daily.  Encouraged him to discuss possibly increasing this dose with his specialist at [facility].   Flank pain Assessment & Plan: Chronic issue.  Possibly related to his back.  He reports fairly extensive workup for other causes.   Encounter for administration of vaccine -     Flu vaccine trivalent PF, 6mos and older(Flulaval,Afluria,Fluarix,Fluzone)     Return in about 3 months (around 07/24/2023) for Transfer care to Dr. Clent Ridges.   [name], MD [facility] Primary Care [facility]

## 2023-04-25 NOTE — Assessment & Plan Note (Signed)
Chronic issue.  Possibly related to his back.  He reports fairly extensive workup for other causes.

## 2023-04-25 NOTE — Assessment & Plan Note (Signed)
>>  ASSESSMENT AND PLAN FOR ANXIETY WRITTEN ON 04/25/2023 10:45 AM BY SONNENBERG, ERIC G, MD  Chronic issue.  Improved somewhat.  Patient will continue sertraline 100 mg daily.  Encouraged him to discuss possibly increasing this dose with his specialist at [facility].

## 2023-05-05 DIAGNOSIS — F411 Generalized anxiety disorder: Secondary | ICD-10-CM | POA: Diagnosis not present

## 2023-05-19 ENCOUNTER — Encounter: Payer: Self-pay | Admitting: Student in an Organized Health Care Education/Training Program

## 2023-05-19 NOTE — Telephone Encounter (Signed)
 Hey, can you look into this for pt please. Thanks.

## 2023-05-20 DIAGNOSIS — E273 Drug-induced adrenocortical insufficiency: Secondary | ICD-10-CM | POA: Diagnosis not present

## 2023-05-20 DIAGNOSIS — R4589 Other symptoms and signs involving emotional state: Secondary | ICD-10-CM | POA: Diagnosis not present

## 2023-05-20 DIAGNOSIS — E039 Hypothyroidism, unspecified: Secondary | ICD-10-CM | POA: Diagnosis not present

## 2023-05-20 DIAGNOSIS — E2740 Unspecified adrenocortical insufficiency: Secondary | ICD-10-CM | POA: Diagnosis not present

## 2023-05-20 DIAGNOSIS — E291 Testicular hypofunction: Secondary | ICD-10-CM | POA: Diagnosis not present

## 2023-05-20 DIAGNOSIS — Z9289 Personal history of other medical treatment: Secondary | ICD-10-CM | POA: Diagnosis not present

## 2023-05-27 ENCOUNTER — Ambulatory Visit: Payer: Medicare HMO | Admitting: Family Medicine

## 2023-05-27 DIAGNOSIS — K224 Dyskinesia of esophagus: Secondary | ICD-10-CM | POA: Diagnosis not present

## 2023-05-27 DIAGNOSIS — Z87891 Personal history of nicotine dependence: Secondary | ICD-10-CM | POA: Diagnosis not present

## 2023-05-27 DIAGNOSIS — C160 Malignant neoplasm of cardia: Secondary | ICD-10-CM | POA: Diagnosis not present

## 2023-05-27 DIAGNOSIS — M19011 Primary osteoarthritis, right shoulder: Secondary | ICD-10-CM | POA: Diagnosis not present

## 2023-05-27 DIAGNOSIS — M25511 Pain in right shoulder: Secondary | ICD-10-CM | POA: Diagnosis not present

## 2023-05-27 DIAGNOSIS — Z9049 Acquired absence of other specified parts of digestive tract: Secondary | ICD-10-CM | POA: Diagnosis not present

## 2023-05-27 DIAGNOSIS — Z125 Encounter for screening for malignant neoplasm of prostate: Secondary | ICD-10-CM | POA: Diagnosis not present

## 2023-05-27 DIAGNOSIS — E236 Other disorders of pituitary gland: Secondary | ICD-10-CM | POA: Diagnosis not present

## 2023-05-27 DIAGNOSIS — F419 Anxiety disorder, unspecified: Secondary | ICD-10-CM | POA: Diagnosis not present

## 2023-05-30 ENCOUNTER — Encounter: Payer: Self-pay | Admitting: Family Medicine

## 2023-05-30 ENCOUNTER — Ambulatory Visit (INDEPENDENT_AMBULATORY_CARE_PROVIDER_SITE_OTHER): Payer: Medicare HMO | Admitting: Family Medicine

## 2023-05-30 VITALS — BP 138/84 | HR 66 | Ht 68.0 in | Wt 242.8 lb

## 2023-05-30 DIAGNOSIS — R051 Acute cough: Secondary | ICD-10-CM | POA: Diagnosis not present

## 2023-05-30 DIAGNOSIS — R7303 Prediabetes: Secondary | ICD-10-CM

## 2023-05-30 DIAGNOSIS — W19XXXA Unspecified fall, initial encounter: Secondary | ICD-10-CM | POA: Insufficient documentation

## 2023-05-30 DIAGNOSIS — E782 Mixed hyperlipidemia: Secondary | ICD-10-CM | POA: Diagnosis not present

## 2023-05-30 DIAGNOSIS — K219 Gastro-esophageal reflux disease without esophagitis: Secondary | ICD-10-CM | POA: Insufficient documentation

## 2023-05-30 DIAGNOSIS — Z1211 Encounter for screening for malignant neoplasm of colon: Secondary | ICD-10-CM

## 2023-05-30 DIAGNOSIS — I10 Essential (primary) hypertension: Secondary | ICD-10-CM | POA: Diagnosis not present

## 2023-05-30 DIAGNOSIS — J40 Bronchitis, not specified as acute or chronic: Secondary | ICD-10-CM | POA: Diagnosis not present

## 2023-05-30 HISTORY — DX: Unspecified fall, initial encounter: W19.XXXA

## 2023-05-30 LAB — POCT GLYCOSYLATED HEMOGLOBIN (HGB A1C): Hemoglobin A1C: 5.7 % — AB (ref 4.0–5.6)

## 2023-05-30 LAB — POC COVID19 BINAXNOW: SARS Coronavirus 2 Ag: NEGATIVE

## 2023-05-30 MED ORDER — ESOMEPRAZOLE MAGNESIUM 40 MG PO CPDR: 40.0000 mg | DELAYED_RELEASE_CAPSULE | Freq: Two times a day (BID) | ORAL | 0 refills | Status: DC

## 2023-05-30 MED ORDER — DOXYCYCLINE HYCLATE 100 MG PO TABS: 100.0000 mg | ORAL_TABLET | Freq: Two times a day (BID) | ORAL | 0 refills | Status: DC

## 2023-05-30 NOTE — Assessment & Plan Note (Signed)
Chronic issue.  Continue Crestor 20 mg daily.

## 2023-05-30 NOTE — Assessment & Plan Note (Signed)
Chronic issue.  Mild worsening recently with what seems to be esophageal spasms.  Will have the patient trial Nexium 40 mg twice daily for 2 weeks.  He will then return back to Nexium 20 mg twice daily.

## 2023-05-30 NOTE — Assessment & Plan Note (Signed)
Chronic issue.  Slightly above goal today.  Discussed the potential of adding additional medication though patient is hesitant to do this until his anxiety gets under better control.  For now he will continue amlodipine 5 mg twice daily.

## 2023-05-30 NOTE — Assessment & Plan Note (Signed)
Patient with what seems to have been a mechanical fall.  Patient is aware to be more careful.  Already had imaging.  He will monitor.

## 2023-05-30 NOTE — Assessment & Plan Note (Addendum)
Suspect bronchitis as a cause of his respiratory symptoms and chest heaviness over the last week.  Given his history of being on steroids chronically and being immunosuppressed we will proceed with treatment with doxycycline 100 mg twice daily.  If not improving he will let us know.  Most recent CT imaging did reveal 2 very small nodular opacities that could be associated with infection though certainly would not seem consistent with a pneumonia.  Patient was counseled on the risk of diarrhea and skin sensitivity to the sun with the doxycycline.

## 2023-05-30 NOTE — Progress Notes (Signed)
[name], MD Phone: [phone]  Jose Hayes is a 63 y.o. male who presents today for f/u.  Hypertension: Typically 130 over mid 80s.  Has been taking amlodipine 5 mg twice daily.  Hyperlipidemia: Taking Crestor.  No right upper quadrant pain.  Respiratory illness: Patient notes over the last several days to a week he has had some heaviness in his chest with some congestion and cough.  He is not coughing much up.  He has some sore throat and postnasal drip that is more so than usual.  Some dyspnea.  No fevers.  Fall: Patient notes he fell recently when his pant leg got caught under his foot on the stair.  He landed on his right elbow and since then has had some upper chest discomfort.  He reports having an x-ray through his physicians at [facility] and there may be an [facility] joint sprain.  X-ray revealed mild AC joint arthrosis.  GERD: Patient reports some reflux recently.  Notes intermittent issues with sharp discomforts in his chest that he attributes to spasms that resolved within a minute.  Notes this discomfort typically resolves with belching.  He is currently taking Nexium 20 mg twice daily over-the-counter though he was previously on 40 mg twice daily through [facility].  Social History   Tobacco Use  Smoking Status Former   Current packs/day: 0.50   Average packs/day: 0.5 packs/day for 25.0 years (12.5 ttl pk-yrs)   Types: Cigarettes  Smokeless Tobacco Never  Tobacco Comments   trying gum to quit    Current Outpatient Medications on File Prior to Visit  Medication Sig Dispense Refill   ALPRAZolam (XANAX) 1 MG tablet Take 1 mg by mouth 3 (three) times daily as needed for anxiety.     amLODipine (NORVASC) 5 MG tablet Take 1 tablet (5 mg total) by mouth 2 (two) times daily. 180 tablet 3   gabapentin (NEURONTIN) 300 MG capsule Take 1 capsule (300 mg total) by mouth at bedtime for 15 days, THEN 1 capsule (300 mg total) 2 (two) times daily. 105 capsule 0   hydrocortisone (CORTEF)  10 MG tablet Take 10-15 mg by mouth See admin instructions. Take 1.5 tablets (15 mg) by mouth in the morning (1 hour after levothyroxine) & take 1 tablet (10 mg) by mouth in the afternoon at 2 pm.     hydrocortisone sodium succinate (SOLU-CORTEF) 100 MG injection Inject 2 mLs (100 mg total) into the muscle as needed. For adrenal crisis. 1 each 1   levothyroxine (SYNTHROID) 88 MCG tablet Take 88 mcg by mouth daily before breakfast.     rosuvastatin (CRESTOR) 20 MG tablet Take 20 mg by mouth daily.     sertraline (ZOLOFT) 100 MG tablet Take 150 mg by mouth daily.     testosterone cypionate (DEPOTESTOSTERONE CYPIONATE) 200 MG/ML injection Inject into the muscle every Monday. 0.59mls     No current facility-administered medications on file prior to visit.     ROS see history of present illness  Objective  Physical Exam Vitals:   05/30/23 0904  BP: 138/84  Pulse: 66  SpO2: 98%    BP Readings from Last 3 Encounters:  05/30/23 138/84  04/25/23 130/76  02/14/23 118/74   Wt Readings from Last 3 Encounters:  05/30/23 242 lb 12.8 oz (110.1 kg)  04/25/23 241 lb 9.6 oz (109.6 kg)  02/14/23 243 lb 8 oz (110.5 kg)    Physical Exam Constitutional:      General: He is not in acute distress.  Appearance: He is not diaphoretic.  Cardiovascular:     Rate and Rhythm: Normal rate and regular rhythm.     Heart sounds: Normal heart sounds.  Pulmonary:     Effort: Pulmonary effort is normal.     Breath sounds: Normal breath sounds.  Musculoskeletal:     Right lower leg: No edema.     Left lower leg: No edema.  Skin:    General: Skin is warm and dry.  Neurological:     Mental Status: He is alert.      Assessment/Plan: Please see individual problem list.  Gastroesophageal reflux disease, unspecified whether esophagitis present Assessment & Plan: Chronic issue.  Mild worsening recently with what seems to be esophageal spasms.  Will have the patient trial Nexium 40 mg twice daily for  2 weeks.  He will then return back to Nexium 20 mg twice daily.  Orders: -     Esomeprazole Magnesium; Take 1 capsule (40 mg total) by mouth 2 (two) times daily before a meal for 14 days.  Dispense: 28 capsule; Refill: 0 -     Ambulatory referral to Gastroenterology  Acute cough -     POC COVID-19 BinaxNow  Prediabetes -     POCT glycosylated hemoglobin (Hb A1C)  Mixed hyperlipidemia Assessment & Plan: Chronic issue.  Continue Crestor 20 mg daily.   Colon cancer screening -     Ambulatory referral to Gastroenterology  Bronchitis Assessment & Plan: Suspect bronchitis as a cause of his respiratory symptoms and chest heaviness over the last week.  Given his history of being on steroids chronically and being immunosuppressed we will proceed with treatment with doxycycline 100 mg twice daily.  If not improving he will let us know.  Most recent CT imaging did reveal 2 very small nodular opacities that could be associated with infection though certainly would not seem consistent with a pneumonia.  Patient was counseled on the risk of diarrhea and skin sensitivity to the sun with the doxycycline.  Orders: -     Doxycycline Hyclate; Take 1 tablet (100 mg total) by mouth 2 (two) times daily.  Dispense: 14 tablet; Refill: 0  Fall, initial encounter Assessment & Plan: Patient with what seems to have been a mechanical fall.  Patient is aware to be more careful.  Already had imaging.  He will monitor.   Primary hypertension Assessment & Plan: Chronic issue.  Slightly above goal today.  Discussed the potential of adding additional medication though patient is hesitant to do this until his anxiety gets under better control.  For now he will continue amlodipine 5 mg twice daily.     Return for as scheduled.   [name], MD [facility]

## 2023-05-30 NOTE — Patient Instructions (Signed)
Nice to see you. We are going to treat you for bronchitis with doxycycline.  If your symptoms are not improving with this please let us know.

## 2023-06-16 DIAGNOSIS — D485 Neoplasm of uncertain behavior of skin: Secondary | ICD-10-CM | POA: Diagnosis not present

## 2023-06-16 DIAGNOSIS — C44311 Basal cell carcinoma of skin of nose: Secondary | ICD-10-CM | POA: Diagnosis not present

## 2023-06-16 DIAGNOSIS — L578 Other skin changes due to chronic exposure to nonionizing radiation: Secondary | ICD-10-CM | POA: Diagnosis not present

## 2023-06-17 NOTE — Telephone Encounter (Signed)
Pt needs help with referral.

## 2023-06-29 DIAGNOSIS — Z9289 Personal history of other medical treatment: Secondary | ICD-10-CM | POA: Diagnosis not present

## 2023-07-12 DIAGNOSIS — F411 Generalized anxiety disorder: Secondary | ICD-10-CM | POA: Diagnosis not present

## 2023-07-19 DIAGNOSIS — G4733 Obstructive sleep apnea (adult) (pediatric): Secondary | ICD-10-CM | POA: Diagnosis not present

## 2023-07-19 DIAGNOSIS — G4752 REM sleep behavior disorder: Secondary | ICD-10-CM | POA: Diagnosis not present

## 2023-07-26 ENCOUNTER — Ambulatory Visit

## 2023-07-28 ENCOUNTER — Encounter: Payer: Medicare HMO | Admitting: Family Medicine

## 2023-07-28 DIAGNOSIS — G4730 Sleep apnea, unspecified: Secondary | ICD-10-CM | POA: Diagnosis not present

## 2023-07-28 DIAGNOSIS — G4733 Obstructive sleep apnea (adult) (pediatric): Secondary | ICD-10-CM | POA: Diagnosis not present

## 2023-08-09 DIAGNOSIS — G4733 Obstructive sleep apnea (adult) (pediatric): Secondary | ICD-10-CM | POA: Diagnosis not present

## 2023-08-13 ENCOUNTER — Ambulatory Visit
Admission: RE | Admit: 2023-08-13 | Discharge: 2023-08-13 | Disposition: A | Discharge status: Home / Self Care | Source: Ambulatory Visit

## 2023-08-13 ENCOUNTER — Other Ambulatory Visit: Payer: Self-pay

## 2023-08-13 ENCOUNTER — Telehealth: Payer: Self-pay

## 2023-08-13 VITALS — BP 153/91 | HR 66 | Temp 98.2°F | Resp 20

## 2023-08-13 DIAGNOSIS — J22 Unspecified acute lower respiratory infection: Secondary | ICD-10-CM | POA: Diagnosis not present

## 2023-08-13 HISTORY — DX: Basal cell carcinoma of skin, unspecified: C44.91

## 2023-08-13 MED ORDER — AZITHROMYCIN 250 MG PO TABS: ORAL_TABLET | ORAL | 0 refills | Status: DC

## 2023-08-13 MED ORDER — DEXAMETHASONE SODIUM PHOSPHATE 10 MG/ML IJ SOLN
10.0000 mg | Freq: Once | INTRAMUSCULAR | Status: AC
  Administered 2023-08-13: 10 mg via INTRAMUSCULAR

## 2023-08-13 NOTE — ED Provider Notes (Signed)
 [facility]    CSN: [id number] Arrival date & time: 08/13/23  1202      History   Chief Complaint Chief Complaint  Patient presents with   Nasal Congestion    Sinus and chest congestion, cough - Entered by patient    HPI Jose Hayes is a 63 y.o. male.   Patient presenting today with 2-week history of what initially started as an upper respiratory infection.  Patient with congestion, sore throat, mild cough that has not resolved and states cough is getting worse.  Having thick productive mucus, chest tightness and rattling the last few days.  Denies fever, chills, chest pain, severe shortness of breath, vomiting, diarrhea.  States he has had some improvement after taking a dose of prednisone yesterday.  Otherwise taking Flonase and using the Navage rinse for seasonal allergies.    Past Medical History:  Diagnosis Date   Adrenal insufficiency (Addison's disease) (HCC)    Allergy    Anxiety    Basal cell carcinoma    nasal   COVID-19    1/14, 05/24/20   Esophageal cancer (HCC) 10/21/2016   GE junction   GERD (gastroesophageal reflux disease)    Hyperglycemia    Hyperlipidemia    Hypertension    Left acoustic neuroma (HCC)    Sleep apnea    CPAP   SVT (supraventricular tachycardia) (HCC)    2013   Thyroid disease    Tobacco abuse     Patient Active Problem List   Diagnosis Date Noted   Bronchitis 05/30/2023   GERD (gastroesophageal reflux disease) 05/30/2023   Fall 05/30/2023   Flank pain 04/25/2023   Skin lesion 04/25/2023   Sinus bradycardia 12/30/2022   Lumbar degenerative disc disease 12/28/2022   Severe left groin pain 12/28/2022   Status post bilateral inguinal hernia repair 12/28/2022   Chronic pain syndrome 12/28/2022   Lumbar facet arthropathy 12/28/2022   Chemotherapy-induced neuropathy (HCC) 12/28/2022   Chronic bilateral low back pain with left-sided sciatica 11/23/2022   Neuropathy 11/23/2022   Poison ivy 05/24/2022   AMS  (altered mental status) 05/02/2022   Complex tear of lateral meniscus of right knee as current injury    Loose body in knee, right knee    Right knee pain 07/01/2021   Pulsatile tinnitus 06/22/2021   Left foot pain 06/22/2021   Inguinal hernia 12/19/2020   Adrenal insufficiency (HCC) 05/30/2020   Hypothyroidism 05/30/2020   Anxiety 05/30/2020   Left acoustic neuroma (HCC) 01/01/2020   Deviated nasal septum 08/07/2019   OSA (obstructive sleep apnea) 08/08/2017   Gastroesophageal cancer (HCC) 11/02/2016   Kidney stones 10/13/2016   Restless leg syndrome 09/04/2015   Hyperlipidemia 12/01/2011   SVT (supraventricular tachycardia) (HCC) 12/01/2011   Obesity 12/01/2011   Hypertension 12/01/2011    Past Surgical History:  Procedure Laterality Date   COLONOSCOPY     KNEE ARTHROSCOPY Right 07/30/2021   Procedure: right knee arthroscopy, debridement, removal loose body;  Surgeon: Cammy Copa, MD;  Location: [facility] OR;  Service: Orthopedics;  Laterality: Right;   LAPAROSCOPIC CHOLECYSTECTOMY  1991   SHOULDER SURGERY Right 1985, 1991, 2000   left x3   [place] teeth extraction     UPPER GASTROINTESTINAL ENDOSCOPY         Home Medications    Prior to Admission medications   Medication Sig Start Date End Date Taking? Authorizing Provider  predniSONE (DELTASONE) 10 MG tablet Take 15 mg by mouth as needed. 08/12/23 first dose, only use  prednisone on sick days. Typically takes 7mg  but reports doubled it yesterday.   Yes [provider]  ALPRAZolam Prudy Feeler) 1 MG tablet Take 1 mg by mouth 3 (three) times daily as needed for anxiety.    [provider]  amLODipine (NORVASC) 5 MG tablet Take 1 tablet (5 mg total) by mouth 2 (two) times daily. 11/29/22   Antonieta Iba, MD  azithromycin (ZITHROMAX) 250 MG tablet Take first 2 tablets together, then 1 every day until finished. 08/13/23   Particia Nearing, PA-C  doxycycline (VIBRA-TABS) 100 MG tablet Take 1 tablet (100 mg  total) by mouth 2 (two) times daily. 05/30/23   Glori Luis, MD  esomeprazole (NEXIUM) 40 MG capsule Take 1 capsule (40 mg total) by mouth 2 (two) times daily before a meal for 14 days. 05/30/23 06/13/23  Glori Luis, MD  gabapentin (NEURONTIN) 300 MG capsule Take 1 capsule (300 mg total) by mouth at bedtime for 15 days, THEN 1 capsule (300 mg total) 2 (two) times daily. 12/28/22 05/30/23  Edward Jolly, MD  hydrocortisone (CORTEF) 10 MG tablet Take 10-15 mg by mouth See admin instructions. Take 1.5 tablets (15 mg) by mouth in the morning (1 hour after levothyroxine) & take 1 tablet (10 mg) by mouth in the afternoon at 2 pm. Patient not taking: Reported on 08/13/2023    [provider]  hydrocortisone sodium succinate (SOLU-CORTEF) 100 MG injection Inject 2 mLs (100 mg total) into the muscle as needed. For adrenal crisis. 05/04/22   Darlin Priestly, MD  levothyroxine (SYNTHROID) 88 MCG tablet Take 88 mcg by mouth daily before breakfast.    [provider]  liothyronine (CYTOMEL) 5 MCG tablet Take 5 mcg by mouth 2 (two) times daily.    [provider]  rosuvastatin (CRESTOR) 20 MG tablet Take 20 mg by mouth daily. 06/19/20   [provider]  sertraline (ZOLOFT) 100 MG tablet Take 150 mg by mouth daily.    [provider]  testosterone cypionate (DEPOTESTOSTERONE CYPIONATE) 200 MG/ML injection Inject into the muscle every Monday. 0.39mls 05/13/21   [provider]    Family History Family History  Problem Relation Age of Onset   Hypertension Mother    Hypertension Father    Pancreatic cancer Father    Clotting disorder Father        renal cell ca   Colon cancer Neg Hx    Esophageal cancer Neg Hx    Prostate cancer Neg Hx    Rectal cancer Neg Hx    Stomach cancer Neg Hx    Colon polyps Neg Hx     Social History Social History   Tobacco Use   Smoking status: Former    Current packs/day: 0.50    Average packs/day: 0.5 packs/day for 25.0  years (12.5 ttl pk-yrs)    Types: Cigarettes   Smokeless tobacco: Never   Tobacco comments:    trying gum to quit  Vaping Use   Vaping status: Former  Substance Use Topics   Alcohol use: Not Currently    Comment: occasionally   Drug use: No     Allergies   Patient has no known allergies.   Review of Systems Review of Systems Per HPI  Physical Exam Triage Vital Signs ED Triage Vitals  Encounter Vitals Group     BP 08/13/23 1215 (!) 153/91     Systolic BP Percentile --      Diastolic BP Percentile --  Pulse Rate 08/13/23 1215 66     Resp 08/13/23 1215 20     Temp 08/13/23 1215 98.2 F (36.8 C)     Temp Source 08/13/23 1215 Oral     SpO2 08/13/23 1215 93 %     Weight --      Height --      Head Circumference --      Peak Flow --      Pain Score 08/13/23 1211 0     Pain Loc --      Pain Education --      Exclude from Growth Chart --    No data found.  Updated Vital Signs BP (!) 153/91 (BP Location: Right Arm)   Pulse 66   Temp 98.2 F (36.8 C) (Oral)   Resp 20   SpO2 93%   Visual Acuity Right Eye Distance:   Left Eye Distance:   Bilateral Distance:    Right Eye Near:   Left Eye Near:    Bilateral Near:     Physical Exam Vitals and nursing note reviewed.  Constitutional:      Appearance: He is well-developed.  HENT:     Head: Atraumatic.     Right Ear: External ear normal.     Left Ear: External ear normal.     Nose: Congestion present.     Mouth/Throat:     Mouth: Mucous membranes are moist.     Pharynx: Posterior oropharyngeal erythema present. No oropharyngeal exudate.  Eyes:     Conjunctiva/sclera: Conjunctivae normal.     Pupils: Pupils are equal, round, and reactive to light.  Cardiovascular:     Rate and Rhythm: Normal rate and regular rhythm.  Pulmonary:     Effort: Pulmonary effort is normal. No respiratory distress.     Breath sounds: No wheezing or rales.  Musculoskeletal:        General: Normal range of motion.      Cervical back: Normal range of motion and neck supple.  Lymphadenopathy:     Cervical: No cervical adenopathy.  Skin:    General: Skin is warm and dry.  Neurological:     Mental Status: He is alert and oriented to person, place, and time.  Psychiatric:        Behavior: Behavior normal.      UC Treatments / Results  Labs (all labs ordered are listed, but only abnormal results are displayed) Labs Reviewed - No data to display  EKG   Radiology No results found.  Procedures Procedures (including critical care time)  Medications Ordered in UC Medications  dexamethasone (DECADRON) injection 10 mg (10 mg Intramuscular Given 08/13/23 1259)    Initial Impression / Assessment and Plan / UC Course  I have reviewed the triage vital signs and the nursing notes.  Pertinent labs & imaging results that were available during my care of the patient were reviewed by me and considered in my medical decision making (see chart for details).     Overall exam and vitals reassuring today, given duration worsening course will treat for lower respiratory infection with Zithromax, IM Decadron, continued over-the-counter supportive medications and home care.  Return for worsening symptoms. Final Clinical Impressions(s) / UC Diagnoses   Final diagnoses:  Lower respiratory infection   Discharge Instructions   None    ED Prescriptions     Medication Sig Dispense Auth. Provider   azithromycin (ZITHROMAX) 250 MG tablet Take first 2 tablets together, then 1 every day until finished.  6 tablet Particia Nearing, [place]      PDMP not reviewed this encounter.   Particia Nearing, [place] 08/13/23 1328

## 2023-08-13 NOTE — Telephone Encounter (Signed)
 Pt pharmacy closed. Sending to [facility]

## 2023-08-13 NOTE — ED Triage Notes (Addendum)
 Pt reports nasal congestion that has progressed "into chest congestion" x2 weeks.pt reports is on chronic steroid replacement and reports started taking prednisone yesterday and that has helped with symptoms.

## 2023-08-28 ENCOUNTER — Ambulatory Visit
Admission: RE | Admit: 2023-08-28 | Discharge: 2023-08-28 | Disposition: A | Discharge status: Home / Self Care | Source: Ambulatory Visit | Attending: Family Medicine

## 2023-08-28 VITALS — BP 148/77 | HR 81 | Temp 98.5°F | Resp 18

## 2023-08-28 DIAGNOSIS — J22 Unspecified acute lower respiratory infection: Secondary | ICD-10-CM | POA: Diagnosis not present

## 2023-08-28 DIAGNOSIS — R062 Wheezing: Secondary | ICD-10-CM

## 2023-08-28 DIAGNOSIS — J40 Bronchitis, not specified as acute or chronic: Secondary | ICD-10-CM

## 2023-08-28 DIAGNOSIS — J0100 Acute maxillary sinusitis, unspecified: Secondary | ICD-10-CM | POA: Diagnosis not present

## 2023-08-28 MED ORDER — ALBUTEROL SULFATE HFA 108 (90 BASE) MCG/ACT IN AERS: 2.0000 | INHALATION_SPRAY | RESPIRATORY_TRACT | 0 refills | Status: DC | PRN

## 2023-08-28 MED ORDER — DEXAMETHASONE SODIUM PHOSPHATE 10 MG/ML IJ SOLN
5.0000 mg | Freq: Once | INTRAMUSCULAR | Status: AC
  Administered 2023-08-28: 5 mg via INTRAMUSCULAR

## 2023-08-28 MED ORDER — DOXYCYCLINE HYCLATE 100 MG PO TABS: 100.0000 mg | ORAL_TABLET | Freq: Two times a day (BID) | ORAL | 0 refills | Status: DC

## 2023-08-28 NOTE — ED Provider Notes (Signed)
 [facility]    CSN: [id number] Arrival date & time: 08/28/23  1355      History   Chief Complaint Chief Complaint  Patient presents with   Wheezing    I was treated here about 2 weeks ago for sinus, respiratory issues. I started to get better but has now gotten worse. - Entered by patient    HPI Jose Hayes is a 63 y.o. male.   Patient presenting today with 1 month history of sore throat, nasal congestion, sinus pain and pressure, productive cough, chest tightness, wheezing.  Denies fever but has body aches and sweats.  Denies chest pain, severe shortness of breath, vomiting, diarrhea.  Was seen about 2 weeks ago and given a steroid shot and antibiotics which seem to help for a short period of time but symptoms have progressively worsened after returning.  He does have a history of seasonal allergies on as needed regimen and is on chronic steroids for adrenal insufficiency.    Past Medical History:  Diagnosis Date   Adrenal insufficiency (Addison's disease) (HCC)    Allergy    Anxiety    Basal cell carcinoma    nasal   COVID-19    1/14, 05/24/20   Esophageal cancer (HCC) 10/21/2016   GE junction   GERD (gastroesophageal reflux disease)    Hyperglycemia    Hyperlipidemia    Hypertension    Left acoustic neuroma (HCC)    Sleep apnea    CPAP   SVT (supraventricular tachycardia) (HCC)    2013   Thyroid  disease    Tobacco abuse     Patient Active Problem List   Diagnosis Date Noted   Bronchitis 05/30/2023   GERD (gastroesophageal reflux disease) 05/30/2023   Fall 05/30/2023   Flank pain 04/25/2023   Skin lesion 04/25/2023   Sinus bradycardia 12/30/2022   Lumbar degenerative disc disease 12/28/2022   Severe left groin pain 12/28/2022   Status post bilateral inguinal hernia repair 12/28/2022   Chronic pain syndrome 12/28/2022   Lumbar facet arthropathy 12/28/2022   Chemotherapy-induced neuropathy (HCC) 12/28/2022   Chronic bilateral low back  pain with left-sided sciatica 11/23/2022   Neuropathy 11/23/2022   Poison ivy 05/24/2022   AMS (altered mental status) 05/02/2022   Complex tear of lateral meniscus of right knee as current injury    Loose body in knee, right knee    Right knee pain 07/01/2021   Pulsatile tinnitus 06/22/2021   Left foot pain 06/22/2021   Inguinal hernia 12/19/2020   Adrenal insufficiency (HCC) 05/30/2020   Hypothyroidism 05/30/2020   Anxiety 05/30/2020   Left acoustic neuroma (HCC) 01/01/2020   Deviated nasal septum 08/07/2019   OSA (obstructive sleep apnea) 08/08/2017   Gastroesophageal cancer (HCC) 11/02/2016   Kidney stones 10/13/2016   Restless leg syndrome 09/04/2015   Hyperlipidemia 12/01/2011   SVT (supraventricular tachycardia) (HCC) 12/01/2011   Obesity 12/01/2011   Hypertension 12/01/2011    Past Surgical History:  Procedure Laterality Date   COLONOSCOPY     KNEE ARTHROSCOPY Right 07/30/2021   Procedure: right knee arthroscopy, debridement, removal loose body;  Surgeon: Jasmine Mesi, MD;  Location: [facility] OR;  Service: Orthopedics;  Laterality: Right;   LAPAROSCOPIC CHOLECYSTECTOMY  1991   SHOULDER SURGERY Right 1985, 1991, 2000   left x3   [place] teeth extraction     UPPER GASTROINTESTINAL ENDOSCOPY         Home Medications    Prior to Admission medications   Medication Sig Start  Date End Date Taking? Authorizing Provider  albuterol  (VENTOLIN  HFA) 108 (90 Base) MCG/ACT inhaler Inhale 2 puffs into the lungs every 4 (four) hours as needed. 08/28/23  Yes Corbin Dess, PA-C  ALPRAZolam  (XANAX ) 1 MG tablet Take 1 mg by mouth 3 (three) times daily as needed for anxiety.    [provider]  amLODipine  (NORVASC ) 5 MG tablet Take 1 tablet (5 mg total) by mouth 2 (two) times daily. 11/29/22   Gollan, Timothy J, MD  azithromycin  (ZITHROMAX ) 250 MG tablet Take first 2 tablets together, then 1 every day until finished. 08/13/23   Corbin Dess, PA-C  doxycycline   (VIBRA -TABS) 100 MG tablet Take 1 tablet (100 mg total) by mouth 2 (two) times daily. 08/28/23   Corbin Dess, PA-C  esomeprazole  (NEXIUM ) 40 MG capsule Take 1 capsule (40 mg total) by mouth 2 (two) times daily before a meal for 14 days. 05/30/23 06/13/23  Kent Pear, MD  gabapentin  (NEURONTIN ) 300 MG capsule Take 1 capsule (300 mg total) by mouth at bedtime for 15 days, THEN 1 capsule (300 mg total) 2 (two) times daily. 12/28/22 05/30/23  Cephus Collin, MD  hydrocortisone  (CORTEF ) 10 MG tablet Take 10-15 mg by mouth See admin instructions. Take 1.5 tablets (15 mg) by mouth in the morning (1 hour after levothyroxine ) & take 1 tablet (10 mg) by mouth in the afternoon at 2 pm. Patient not taking: Reported on 08/13/2023    [provider]  hydrocortisone  sodium succinate  (SOLU-CORTEF ) 100 MG injection Inject 2 mLs (100 mg total) into the muscle as needed. For adrenal crisis. 05/04/22   Garrison Kanner, MD  levothyroxine  (SYNTHROID ) 88 MCG tablet Take 88 mcg by mouth daily before breakfast.    [provider]  liothyronine (CYTOMEL) 5 MCG tablet Take 5 mcg by mouth 2 (two) times daily.    [provider]  predniSONE  (DELTASONE ) 10 MG tablet Take 15 mg by mouth as needed. 08/12/23 first dose, only use prednisone  on sick days. Typically takes 7mg  but reports doubled it yesterday.    [provider]  rosuvastatin  (CRESTOR ) 20 MG tablet Take 20 mg by mouth daily. 06/19/20   [provider]  sertraline (ZOLOFT) 100 MG tablet Take 150 mg by mouth daily.    [provider]  testosterone  cypionate (DEPOTESTOSTERONE CYPIONATE) 200 MG/ML injection Inject into the muscle every Monday. 0.105mls 05/13/21   [provider]    Family History Family History  Problem Relation Age of Onset   Hypertension Mother    Hypertension Father    Pancreatic cancer Father    Clotting disorder Father        renal cell ca   Colon cancer Neg Hx    Esophageal cancer Neg  Hx    Prostate cancer Neg Hx    Rectal cancer Neg Hx    Stomach cancer Neg Hx    Colon polyps Neg Hx     Social History Social History   Tobacco Use   Smoking status: Former    Current packs/day: 0.50    Average packs/day: 0.5 packs/day for 25.0 years (12.5 ttl pk-yrs)    Types: Cigarettes   Smokeless tobacco: Never   Tobacco comments:    trying gum to quit  Vaping Use   Vaping status: Former  Substance Use Topics   Alcohol use: Not Currently    Comment: occasionally   Drug use: No     Allergies   Patient has no known allergies.  Review of Systems Review of Systems Per HPI  Physical Exam Triage Vital Signs ED Triage Vitals [08/28/23 1405]  Encounter Vitals Group     BP (!) 148/77     Systolic BP Percentile      Diastolic BP Percentile      Pulse Rate 81     Resp 18     Temp 98.5 F (36.9 C)     Temp Source Oral     SpO2 94 %     Weight      Height      Head Circumference      Peak Flow      Pain Score 5     Pain Loc      Pain Education      Exclude from Growth Chart    No data found.  Updated Vital Signs BP (!) 148/77 (BP Location: Right Arm)   Pulse 81   Temp 98.5 F (36.9 C) (Oral)   Resp 18   SpO2 94%   Visual Acuity Right Eye Distance:   Left Eye Distance:   Bilateral Distance:    Right Eye Near:   Left Eye Near:    Bilateral Near:     Physical Exam Vitals and nursing note reviewed.  Constitutional:      Appearance: He is well-developed.  HENT:     Head: Atraumatic.     Right Ear: External ear normal.     Left Ear: External ear normal.     Nose: Congestion present.     Mouth/Throat:     Pharynx: Posterior oropharyngeal erythema present. No oropharyngeal exudate.  Eyes:     Conjunctiva/sclera: Conjunctivae normal.     Pupils: Pupils are equal, round, and reactive to light.  Cardiovascular:     Rate and Rhythm: Normal rate and regular rhythm.  Pulmonary:     Effort: Pulmonary effort is normal. No respiratory distress.      Breath sounds: Wheezing present. No rales.  Musculoskeletal:        General: Normal range of motion.     Cervical back: Normal range of motion and neck supple.  Lymphadenopathy:     Cervical: No cervical adenopathy.  Skin:    General: Skin is warm and dry.  Neurological:     Mental Status: He is alert and oriented to person, place, and time.  Psychiatric:        Behavior: Behavior normal.      UC Treatments / Results  Labs (all labs ordered are listed, but only abnormal results are displayed) Labs Reviewed - No data to display  EKG   Radiology No results found.  Procedures Procedures (including critical care time)  Medications Ordered in UC Medications  dexamethasone  (DECADRON ) injection 5 mg (has no administration in time range)    Initial Impression / Assessment and Plan / UC Course  I have reviewed the triage vital signs and the nursing notes.  Pertinent labs & imaging results that were available during my care of the patient were reviewed by me and considered in my medical decision making (see chart for details).     Will treat with IM Decadron , change antibiotic to doxycycline  and give albuterol  for wheezing as needed.  Discussed supportive over-the-counter medications, home care additionally.  Return for any worsening symptoms.  Final Clinical Impressions(s) / UC Diagnoses   Final diagnoses:  Acute non-recurrent maxillary sinusitis  Wheezing  Lower respiratory infection   Discharge Instructions   None    ED  Prescriptions     Medication Sig Dispense Auth. Provider   doxycycline  (VIBRA -TABS) 100 MG tablet Take 1 tablet (100 mg total) by mouth 2 (two) times daily. 14 tablet Tacy [place], PA-C   albuterol  (VENTOLIN  HFA) 108 (90 Base) MCG/ACT inhaler Inhale 2 puffs into the lungs every 4 (four) hours as needed. 18 g Corbin Dess, [place]      PDMP not reviewed this encounter.   Corbin Dess, [place] 08/28/23 1454

## 2023-08-28 NOTE — ED Triage Notes (Signed)
 Pt reports he has throat pain, nasal congestion, cough chest discomfort,  and wheezing x 1 month.

## 2023-08-30 DIAGNOSIS — E039 Hypothyroidism, unspecified: Secondary | ICD-10-CM | POA: Diagnosis not present

## 2023-08-30 DIAGNOSIS — C160 Malignant neoplasm of cardia: Secondary | ICD-10-CM | POA: Diagnosis not present

## 2023-08-30 DIAGNOSIS — E291 Testicular hypofunction: Secondary | ICD-10-CM | POA: Diagnosis not present

## 2023-08-31 DIAGNOSIS — E2740 Unspecified adrenocortical insufficiency: Secondary | ICD-10-CM | POA: Diagnosis not present

## 2023-08-31 DIAGNOSIS — I10 Essential (primary) hypertension: Secondary | ICD-10-CM | POA: Diagnosis not present

## 2023-08-31 DIAGNOSIS — R001 Bradycardia, unspecified: Secondary | ICD-10-CM | POA: Diagnosis not present

## 2023-08-31 DIAGNOSIS — R0902 Hypoxemia: Secondary | ICD-10-CM | POA: Diagnosis not present

## 2023-08-31 DIAGNOSIS — Z923 Personal history of irradiation: Secondary | ICD-10-CM | POA: Diagnosis not present

## 2023-08-31 DIAGNOSIS — J342 Deviated nasal septum: Secondary | ICD-10-CM | POA: Diagnosis not present

## 2023-08-31 DIAGNOSIS — E7800 Pure hypercholesterolemia, unspecified: Secondary | ICD-10-CM | POA: Diagnosis not present

## 2023-08-31 DIAGNOSIS — R0602 Shortness of breath: Secondary | ICD-10-CM | POA: Diagnosis not present

## 2023-08-31 DIAGNOSIS — J343 Hypertrophy of nasal turbinates: Secondary | ICD-10-CM | POA: Diagnosis not present

## 2023-08-31 DIAGNOSIS — J019 Acute sinusitis, unspecified: Secondary | ICD-10-CM | POA: Diagnosis not present

## 2023-08-31 DIAGNOSIS — J3489 Other specified disorders of nose and nasal sinuses: Secondary | ICD-10-CM | POA: Diagnosis not present

## 2023-08-31 DIAGNOSIS — J329 Chronic sinusitis, unspecified: Secondary | ICD-10-CM | POA: Diagnosis not present

## 2023-08-31 DIAGNOSIS — J209 Acute bronchitis, unspecified: Secondary | ICD-10-CM | POA: Diagnosis not present

## 2023-08-31 DIAGNOSIS — R0981 Nasal congestion: Secondary | ICD-10-CM | POA: Diagnosis not present

## 2023-08-31 DIAGNOSIS — J0191 Acute recurrent sinusitis, unspecified: Secondary | ICD-10-CM | POA: Diagnosis not present

## 2023-08-31 DIAGNOSIS — Z9221 Personal history of antineoplastic chemotherapy: Secondary | ICD-10-CM | POA: Diagnosis not present

## 2023-08-31 DIAGNOSIS — G4733 Obstructive sleep apnea (adult) (pediatric): Secondary | ICD-10-CM | POA: Diagnosis not present

## 2023-08-31 DIAGNOSIS — J324 Chronic pansinusitis: Secondary | ICD-10-CM | POA: Diagnosis not present

## 2023-08-31 DIAGNOSIS — C160 Malignant neoplasm of cardia: Secondary | ICD-10-CM | POA: Diagnosis not present

## 2023-08-31 DIAGNOSIS — Z87891 Personal history of nicotine dependence: Secondary | ICD-10-CM | POA: Diagnosis not present

## 2023-08-31 DIAGNOSIS — Z7952 Long term (current) use of systemic steroids: Secondary | ICD-10-CM | POA: Diagnosis not present

## 2023-09-08 DIAGNOSIS — G4733 Obstructive sleep apnea (adult) (pediatric): Secondary | ICD-10-CM | POA: Diagnosis not present

## 2023-09-09 ENCOUNTER — Ambulatory Visit (INDEPENDENT_AMBULATORY_CARE_PROVIDER_SITE_OTHER)

## 2023-09-09 VITALS — BP 132/74 | HR 75 | Temp 98.3°F | Ht 68.0 in | Wt 241.6 lb

## 2023-09-09 DIAGNOSIS — E782 Mixed hyperlipidemia: Secondary | ICD-10-CM

## 2023-09-09 DIAGNOSIS — R7303 Prediabetes: Secondary | ICD-10-CM

## 2023-09-09 DIAGNOSIS — E2740 Unspecified adrenocortical insufficiency: Secondary | ICD-10-CM

## 2023-09-09 DIAGNOSIS — I4902 Ventricular flutter: Secondary | ICD-10-CM

## 2023-09-09 DIAGNOSIS — R052 Subacute cough: Secondary | ICD-10-CM | POA: Diagnosis not present

## 2023-09-09 DIAGNOSIS — I10 Essential (primary) hypertension: Secondary | ICD-10-CM

## 2023-09-09 DIAGNOSIS — K219 Gastro-esophageal reflux disease without esophagitis: Secondary | ICD-10-CM | POA: Diagnosis not present

## 2023-09-09 DIAGNOSIS — C160 Malignant neoplasm of cardia: Secondary | ICD-10-CM | POA: Diagnosis not present

## 2023-09-09 DIAGNOSIS — M79672 Pain in left foot: Secondary | ICD-10-CM | POA: Diagnosis not present

## 2023-09-09 MED ORDER — BENZONATATE 100 MG PO CAPS: 100.0000 mg | ORAL_CAPSULE | Freq: Three times a day (TID) | ORAL | 0 refills | Status: DC | PRN

## 2023-09-09 NOTE — Progress Notes (Signed)
 Established Patient Office Visit   Subjective  Patient ID: Jose Hayes, male    DOB: May 25, 1960  Age: 63 y.o. MRN: [id number]  Chief Complaint  Patient presents with   Establish Care    He  has a past medical history of Adrenal insufficiency (Addison's disease) (HCC), Allergy, Anxiety, Basal cell carcinoma, COVID-19, Esophageal cancer (HCC) (10/21/2016), GERD (gastroesophageal reflux disease), Hyperglycemia, Hyperlipidemia, Hypertension, Left acoustic neuroma (HCC), Sleep apnea, SVT (supraventricular tachycardia) (HCC), Thyroid  disease, and Tobacco abuse.  HPI Established patient of Dr. Lovetta Rucks presenting for transfer of care. His last visit with Dr. Lovetta Rucks was on 05/30/2023.   1) Hypertension: On Amlodipine  5 mg, twice a day. Checking BP at home. Patient check BP at home, ranging around 125-130 SBP and DBP 70-78 mmHg. Patient denies lower leg edema. Has seen cardiology in the past. Patient reports flutter like sensation about 2 weeks.     2) Hyperlipidemia: On Crestor  20 mg.   3) GERD: On Esmoprazole 20 mg BID. Comes through his oncologist at [place]. He was referred to GI by Dr. Lovetta Rucks in 05/2023. He has an appointment with GI in June of this year.   4) Prediabetes: Last A1c was was 5.7% on 05/30/23.   5) GAD-7: Anxiety, GAD 7, established with [facility], seeing provider Graceann Laurel every 6 months. Currently on Alprazolam  as needed. On Sertraline 150 mg daily. No SI/HI.   6) Cough, sinusitis: Was seen on  ED on 08/31/2023: for acute on chronic sinusitis: Was treated with Albuterol  inhaler, Prednisone  taper and Doxycycline . Sees ENT with [facility]. He does daily sinus lavage twice a day and twice a day Flonase . Patient continues to have cough. He has a h/o smoking for about 15 years where he smoked about 1/2 pack of cigarettes daily. Quit around 2005.  7) OSA: On CPAP, currently using around 6-7 hours per night. F/U with [facility]. Has had REM sleep behavior problem in  the past, thought to be secondary to Venlafaxine. Not on Venlafaxine anymore. Has been stable but he is seeing a new sleep doctor within [place].    8) Esophageal cancer h/o: Dx in 2018, seeing [facility].   9) Adrenal insuffiencey: On chronic Hydrocortisone  therapy. He has been seeing [facility] end[facility]ology. He is planning on seeing Brennan Camara with [facility], which is next week. He is also taking Liothyronine 5 mg once a day and Synthroid  75 mcg daily.  Testosterone  injection: once a week   10) Exercises regularly, including strengthening, cardiovascular exercise. He eats mostly home made food.   11) Left foot pain: Patient reports he was walking and was turning when he felt pain on the left foot.  No swelling. Pain seems to be worse with putting weight.    ROS As per HPI    Objective:     BP 132/74   Pulse 75   Temp 98.3 F (36.8 C) (Oral)   Ht 5\' 8"  (1.727 m)   Wt 241 lb 9.6 oz (109.6 kg)   SpO2 95%   BMI 36.74 kg/m      09/09/2023   11:15 AM 05/30/2023    9:07 AM 04/25/2023   10:34 AM  Depression screen PHQ 2/9  Decreased Interest 2 2 2   Down, Depressed, Hopeless 1 2 1   PHQ - 2 Score 3 4 3   Altered sleeping 3 2 3   Tired, decreased energy 3 3 3   Change in appetite 1 2 1   Feeling bad or failure about yourself  0 1 1  Trouble concentrating 1 1 0  Moving slowly or fidgety/restless 0 0 1  Suicidal thoughts 0 0 0  PHQ-9 Score 11 13 12   Difficult doing work/chores Very difficult Very difficult Very difficult      09/09/2023   11:16 AM 05/30/2023    9:08 AM 04/25/2023   10:41 AM 01/28/2023    8:20 AM  GAD 7 : Generalized Anxiety Score  Nervous, Anxious, on Edge 3 2 2  0  Control/stop worrying 3 2 1  0  Worry too much - different things 2 2 2  0  Trouble relaxing 1 2 1  0  Restless 0 1 0 0  Easily annoyed or irritable 1 1 1  0  Afraid - awful might happen 2 1 0 0  Total GAD 7 Score 12 11 7  0  Anxiety Difficulty Very difficult Extremely difficult Very difficult Not difficult  at all    Physical Exam Constitutional:      Appearance: He is obese.  HENT:     Head: Normocephalic and atraumatic.     Right Ear: Tympanic membrane normal.     Left Ear: Tympanic membrane normal.     Nose: No congestion.     Mouth/Throat:     Mouth: Mucous membranes are moist.  Cardiovascular:     Rate and Rhythm: Normal rate.     Pulses: Normal pulses.     Heart sounds: Normal heart sounds.  Pulmonary:     Effort: Pulmonary effort is normal.     Comments: Bibasilar rhonchi noted.  Musculoskeletal:     Cervical back: Neck supple. No rigidity.     Right lower leg: No edema.     Left lower leg: No edema.     Right foot: Normal range of motion and normal capillary refill. No swelling, deformity, foot drop, prominent metatarsal heads, laceration, tenderness, bony tenderness or crepitus. Normal pulse.     Left foot: Normal range of motion and normal capillary refill. No swelling, deformity, foot drop, prominent metatarsal heads, laceration, tenderness, bony tenderness or crepitus. Normal pulse.     Comments: Reduced sensation on base of left distal phalanges otherwise no abnormal findings on exam.  Neurological:     Mental Status: He is alert and oriented to person, place, and time.  Psychiatric:        Mood and Affect: Mood is anxious.        Behavior: Behavior is cooperative.        No results found for any visits on 09/09/23.  The ASCVD Risk score (Arnett DK, et al., 2019) failed to calculate for the following reasons:   The valid total cholesterol range is 130 to 320 mg/dL    Assessment & Plan:  Subacute cough Assessment & Plan: D/D includes COPD, GERD, postnasal drip. Given h/o smoking, on chronic immunosuppressive therapy, exam findings I believe he will benefit from getting a PFT (to r/o reactive airway, COPD) and pulmonology follow up. Referral made today. Also recommend continue prn q6 hourly Albuterol  use for wheezing and started him on Benzonatate  100 mg, three  times a day as needed for cough.   Orders: -     Pulmonary Visit -     Benzonatate ; Take 1 capsule (100 mg total) by mouth 3 (three) times daily as needed for cough.  Dispense: 21 capsule; Refill: 0  Prediabetes Assessment & Plan: Chronic issue, continue lifestyle modification. Recheck A1c before next appointment. Lab ordered.  Orders: -     Hemoglobin A1c; Future  Mixed hyperlipidemia  Assessment & Plan: Chronic issue.  Continue Crestor  20 mg daily, repeat lipid panel in 3 months, lab ordered.  Orders: -     Lipid panel; Future -     Comprehensive metabolic panel with GFR; Future  Primary hypertension Assessment & Plan: Chronic issue. Ideal BP 130/80 mmHg. BP slightly above goal today. Likely multifactorial (anxiety, recent illness). Recommend continue home BP monitoring. Continue amlodipine  5 mg twice daily. I also counseled the patient to f/u with cardiology for flutter like sensation without chest pain. He has seen Dr. Gollan in the past. If chest pain, dyspnea, lower leg edema recommend reach out to our clinic.   Orders: -     Comprehensive metabolic panel with GFR; Future  Gastroesophageal reflux disease, unspecified whether esophagitis present Assessment & Plan: Chronic issue.  On Nexium  20 mg twice daily with symptoms control. Getting refill from his oncologist now.  He was referred to GI by Dr. Lovetta Rucks in 05/2023. Patient reports he has upcoming appointment with GI in June. I encourage patient keep that appointment.    Left foot pain Assessment & Plan: Recommend x-ray to r/o fracture. Physical exam reassuring. Patient will use Voltaren gel 1%, three times a day as needed for pain.  He can try prn Tylenol  for discomfort. If x-ray concerning will recommend referral to ortho. We don't have x-ray in the clinic today. I gave patient options on locations he can get x-ray updated. However, patient would rather get x-ray done in the clinic in the future if he has pain, persists  to have symptoms or worsening pain. X-ray ordered. I recommend calling out clinic to check to make sure we have x-ray in available if he decides to update x-ray before he comes in the clinic.   Orders: -     DG Foot 2 Views Left; Future  Heart flutter, ventricular (HCC)  Gastroesophageal cancer [facility]) Assessment & Plan: Patient has undergone treatment for this. Seeing oncology at [facility]. No new symptoms.    Obesity, morbid (HCC) Assessment & Plan: Life style modifications discussed with the patient including continuing moderate intensity exercise.   Adrenal insufficiency [facility]) Assessment & Plan: Managed by endocrinology at [facility], continue f/u.     Was provided with following information on AVS:  1) Please reach out to your cardiologist for follow up on the flutter like sensation. Your last appointment with Dr. Gollan was on 02/14/2023.   2) Apply Voltaren gel, 3-4 times a day as needed on left foot for pain. You can take Tylenol  up to 2 gm per day but please do not take more than that daily.   3) Please reach out to pain clinic to schedule an appointment. If they ask for referral, let us  know and I am happy to put a referral.   4) If you do not hear back from pulmonology clinic in 2 weeks please let me know. Continue using Albuterol  every 6 hourly as needed for wheezing. You can try Tessalon  100 mg, three times a day as needed for cough.   5) I have ordered left foot x-ray, if you feel like pain on the left foot persists, swelling I recommend you get x-ray done. If you notice mild improvement in symptoms you can observe, apply Voltaren gel as mentioned in no.2.   Return in about 3 months (around 12/10/2023) for chronic follow up, fasting labs 2 days before apt.   Jacklin Mascot, MD

## 2023-09-09 NOTE — Assessment & Plan Note (Signed)
 Managed by endocrinology at [facility], continue f/u.

## 2023-09-09 NOTE — Assessment & Plan Note (Signed)
 D/D includes COPD, GERD, postnasal drip. Given h/o smoking, on chronic immunosuppressive therapy, exam findings I believe he will benefit from getting a PFT (to r/o reactive airway, COPD) and pulmonology follow up. Referral made today. Also recommend continue prn q6 hourly Albuterol  use for wheezing and started him on Benzonatate  100 mg, three times a day as needed for cough.

## 2023-09-09 NOTE — Assessment & Plan Note (Signed)
 Chronic issue.  Continue Crestor  20 mg daily, repeat lipid panel in 3 months, lab ordered.

## 2023-09-09 NOTE — Assessment & Plan Note (Signed)
 Life style modifications discussed with the patient including continuing moderate intensity exercise.

## 2023-09-09 NOTE — Assessment & Plan Note (Signed)
 Chronic issue.  On Nexium  20 mg twice daily with symptoms control. Getting refill from his oncologist now.  He was referred to GI by Dr. Lovetta Rucks in 05/2023. Patient reports he has upcoming appointment with GI in June. I encourage patient keep that appointment.

## 2023-09-09 NOTE — Assessment & Plan Note (Signed)
 Chronic issue. Ideal BP 130/80 mmHg. BP slightly above goal today. Likely multifactorial (anxiety, recent illness). Recommend continue home BP monitoring. Continue amlodipine  5 mg twice daily. I also counseled the patient to f/u with cardiology for flutter like sensation without chest pain. He has seen Dr. Gollan in the past. If chest pain, dyspnea, lower leg edema recommend reach out to our clinic.

## 2023-09-09 NOTE — Patient Instructions (Addendum)
 1) Please reach out to your cardiologist for follow up on the flutter like sensation. Your last appointment with Dr. Gollan was on 02/14/2023.   2) Apply Voltaren gel, 3-4 times a day as needed on left foot for pain. You can take Tylenol  up to 2 gm per day but please do not take more than that daily.   3) Please reach out to pain clinic to schedule an appointment. If they ask for referral, let us  know and I am happy to put a referral.   4) If you do not hear back from pulmonology clinic in 2 weeks please let me know. Continue using Albuterol  every 6 hourly as needed for wheezing. You can try Tessalon  100 mg, three times a day as needed for cough.   5) I have ordered left foot x-ray, if you feel like pain on the left foot persists, swelling I recommend you get x-ray done. If you notice mild improvement in symptoms you can observe, apply Voltaren gel as mentioned in no.2.

## 2023-09-09 NOTE — Assessment & Plan Note (Signed)
 Recommend x-ray to r/o fracture. Physical exam reassuring. Patient will use Voltaren gel 1%, three times a day as needed for pain.  He can try prn Tylenol  for discomfort. If x-ray concerning will recommend referral to ortho. We don't have x-ray in the clinic today. I gave patient options on locations he can get x-ray updated. However, patient would rather get x-ray done in the clinic in the future if he has pain, persists to have symptoms or worsening pain. X-ray ordered. I recommend calling out clinic to check to make sure we have x-ray in available if he decides to update x-ray before he comes in the clinic.

## 2023-09-09 NOTE — Assessment & Plan Note (Signed)
 Patient has undergone treatment for this. Seeing oncology at [facility]. No new symptoms.

## 2023-09-09 NOTE — Assessment & Plan Note (Signed)
 Chronic issue, continue lifestyle modification. Recheck A1c before next appointment. Lab ordered.

## 2023-09-12 ENCOUNTER — Telehealth: Payer: Self-pay

## 2023-09-12 NOTE — Telephone Encounter (Signed)
 Called and lvm for  pt informing him that he needs to be seen in the ED for proper treatment. We are unable to access pt properly due to his recent events. If pt calls back please inform pt to go to ED. Okay to relay

## 2023-09-12 NOTE — Telephone Encounter (Signed)
 Spoke with pt to advise him that he needs to be evaluated in the ED. Pt stated that he does not feel that he needs to go to the ED right now. He stated that it has been 24 hrs since the episode occurred and he isn't sure what they could do for him now. I advised pt that he the lose of consciousness was due to dehydration as he thinks he then he will need IV fluids and we don't have any way to give them here. Pt verbalized understanding but stated that he will keep his appt for tomorrow but if "things go south" he will go to the ED.

## 2023-09-13 ENCOUNTER — Ambulatory Visit (INDEPENDENT_AMBULATORY_CARE_PROVIDER_SITE_OTHER)

## 2023-09-13 VITALS — BP 120/82 | HR 81 | Temp 98.0°F | Ht 68.0 in | Wt 244.4 lb

## 2023-09-13 DIAGNOSIS — Z9189 Other specified personal risk factors, not elsewhere classified: Secondary | ICD-10-CM | POA: Diagnosis not present

## 2023-09-13 DIAGNOSIS — E039 Hypothyroidism, unspecified: Secondary | ICD-10-CM | POA: Diagnosis not present

## 2023-09-13 DIAGNOSIS — E2740 Unspecified adrenocortical insufficiency: Secondary | ICD-10-CM

## 2023-09-13 LAB — CBC WITH DIFFERENTIAL/PLATELET
Basophils Absolute: 0 10*3/uL (ref 0.0–0.1)
Basophils Relative: 0.6 % (ref 0.0–3.0)
Eosinophils Absolute: 0 10*3/uL (ref 0.0–0.7)
Eosinophils Relative: 0.7 % (ref 0.0–5.0)
HCT: 43.4 % (ref 39.0–52.0)
Hemoglobin: 14.3 g/dL (ref 13.0–17.0)
Lymphocytes Relative: 20.2 % (ref 12.0–46.0)
Lymphs Abs: 1.3 10*3/uL (ref 0.7–4.0)
MCHC: 33.1 g/dL (ref 30.0–36.0)
MCV: 88.3 fl (ref 78.0–100.0)
Monocytes Absolute: 0.6 10*3/uL (ref 0.1–1.0)
Monocytes Relative: 9.7 % (ref 3.0–12.0)
Neutro Abs: 4.3 10*3/uL (ref 1.4–7.7)
Neutrophils Relative %: 68.8 % (ref 43.0–77.0)
Platelets: 229 10*3/uL (ref 150.0–400.0)
RBC: 4.91 Mil/uL (ref 4.22–5.81)
RDW: 15.8 % — ABNORMAL HIGH (ref 11.5–15.5)
WBC: 6.2 10*3/uL (ref 4.0–10.5)

## 2023-09-13 LAB — COMPREHENSIVE METABOLIC PANEL WITH GFR
ALT: 21 U/L (ref 0–53)
AST: 19 U/L (ref 0–37)
Albumin: 4.4 g/dL (ref 3.5–5.2)
Alkaline Phosphatase: 48 U/L (ref 39–117)
BUN: 21 mg/dL (ref 6–23)
CO2: 28 meq/L (ref 19–32)
Calcium: 8.8 mg/dL (ref 8.4–10.5)
Chloride: 102 meq/L (ref 96–112)
Creatinine, Ser: 1.04 mg/dL (ref 0.40–1.50)
GFR: 76.81 mL/min (ref >60.00)
Glucose, Bld: 108 mg/dL — ABNORMAL HIGH (ref 70–99)
Potassium: 4.3 meq/L (ref 3.5–5.1)
Sodium: 139 meq/L (ref 135–145)
Total Bilirubin: 0.7 mg/dL (ref 0.2–1.2)
Total Protein: 6.9 g/dL (ref 6.0–8.3)

## 2023-09-13 LAB — TSH: TSH: 1.28 u[IU]/mL (ref 0.35–5.50)

## 2023-09-13 NOTE — Progress Notes (Signed)
 Acute Office Visit  Subjective:    Patient ID: Jose Hayes, male    DOB: April 28, 1961, 63 y.o.   MRN: [id number]  Chief Complaint  Patient presents with   Dehydration   Hypotension   Loss of Consciousness    Patient states he has been having issues with feeling a burning sensation all over his body and then he went and got in the hot tub he and his wife. Patient states he began to feel lightheaded and then went to get off and says he loss consciousness for about 20-30 seconds according to wife. Patient states he recently was taking Doxycycline  and thinks he may have been sensitive to the medication.   Patient is in today for following acute visit concerns:   1) Syncopal episode: Patient reports he overheated and "fainted briefly, for about 30 sends" after being on a hot tub on 09/11/23. He reports prior to losing consciousness he felt warm, lightheaded, change in vision. His wife was with him when this episode happened. He reports his BP was 99/55 mmHg at the time. He did not go to the ED as patient started to feel better. He denies palpitations, chest pain with or after this episode. He denies loss of bladder/bowel control with this episode.  He drinks about 14 cups (8oz) water, sports drink daily.  He also reports since adding Liothyronine in the beginning of April he has been feeling more warm. No fever, chills, nausea, vomiting, diarrhea.   He has an appointment with [facility] endocrinologist on 09/14/23 to discuss hypothyroidism management.   Patient also on Levothyroxine , testosterone , hydrocortisone  therapy. Follows up with [facility] for most of his care.     ROS As per HPI    Objective:    BP 120/82   Pulse 81   Temp 98 F (36.7 C) (Oral)   Ht 5\' 8"  (1.727 m)   Wt 244 lb 6.4 oz (110.9 kg)   SpO2 97%   BMI 37.16 kg/m    Physical Exam Constitutional:      Appearance: He is obese.  HENT:     Head: Normocephalic and atraumatic.     Mouth/Throat:     Mouth: Mucous membranes  are moist.  Eyes:     Conjunctiva/sclera: Conjunctivae normal.  Neck:     Thyroid : No thyroid  mass or thyroid  tenderness.  Cardiovascular:     Rate and Rhythm: Normal rate and regular rhythm.  Pulmonary:     Effort: Pulmonary effort is normal.     Breath sounds: Normal breath sounds.  Abdominal:     General: Abdomen is protuberant.     Palpations: Abdomen is soft.  Musculoskeletal:     Cervical back: Neck supple. No rigidity.     Right lower leg: No edema.     Left lower leg: No edema.  Skin:    General: Skin is warm.  Neurological:     Mental Status: He is alert and oriented to person, place, and time.  Psychiatric:        Mood and Affect: Mood is anxious.        Behavior: Behavior is cooperative.     No results found for any visits on 09/13/23.     Assessment & Plan:  History of fainting Assessment & Plan: Patient's symptoms suggestive of vasovagal syncope. 12 lead EKG in the clinic shows sinus rhythm with regular rhythm and HR of 61/min. I discussed Holter monitoring to r/o arrhythmia, getting CBC to r/o anemia, CMP to r/o  electrolytes abnormality, seeing cardiologist. Labs and EKG reviewed. Patient plans on following up with his cardiologist. Patient has severe anxiety surrounding his health. He is already seeing a therapist, psychiatrist. Currently he is f/u with his therapist twice a month. I recommend weekly counseling and f/u with psychologist to see medication adjustment can be helpful.   Orders: -     Comprehensive metabolic panel with GFR -     CBC with Differential/Platelet -     TSH -     EKG 12-Lead  Hypothyroidism, unspecified type Assessment & Plan: Patient currently on Levothyroxine  and Liothyronine managed by [facility] endo. We will check TSH, t3, t4 today to r/o hyperthyroidism. If abnormal thyroid  function level we discussed patient will f/u with his endocrinologist for dose adjustment.   Orders: -     T4 -     T3  Adrenal insufficiency  (HCC) Assessment & Plan: Managed by endocrinology at [facility], continue f/u.  We will check CMP today given recent episode.      Return in about 3 months (around 12/15/2023) for Chronic f/u . If syncopal episode happens again patient is counseled to be evaluated in the ED.    Jacklin Mascot, MD

## 2023-09-13 NOTE — Assessment & Plan Note (Signed)
 Patient currently on Levothyroxine  and Liothyronine managed by [facility] endo. We will check TSH, t3, t4 today to r/o hyperthyroidism. If abnormal thyroid  function level we discussed patient will f/u with his endocrinologist for dose adjustment.

## 2023-09-13 NOTE — Telephone Encounter (Signed)
 Noted.  Jose Mascot, MD

## 2023-09-13 NOTE — Assessment & Plan Note (Signed)
 Patient's symptoms suggestive of vasovagal syncope. 12 lead EKG in the clinic shows sinus rhythm with regular rhythm and HR of 61/min. I discussed Holter monitoring to r/o arrhythmia, getting CBC to r/o anemia, CMP to r/o electrolytes abnormality, seeing cardiologist. Labs and EKG reviewed. Patient plans on following up with his cardiologist. Patient has severe anxiety surrounding his health. He is already seeing a therapist, psychiatrist. Currently he is f/u with his therapist twice a month. I recommend weekly counseling and f/u with psychologist to see medication adjustment can be helpful.

## 2023-09-13 NOTE — Patient Instructions (Signed)
--   We will get TSH, t3, t4. I am not going to change your thyroid  medication but this will let us  know if medication adjustment needs to be made with your endocrinologist.  -- We will check your kidney, liver function and CBC today.  -- I also recommend we schedule an appointment with your cardiologist.  -- If we can increase counseling sessions to once a week I think that will be very beneficial for you.

## 2023-09-13 NOTE — Assessment & Plan Note (Addendum)
 Managed by endocrinology at [facility], continue f/u.  We will check CMP today given recent episode.

## 2023-09-14 DIAGNOSIS — E236 Other disorders of pituitary gland: Secondary | ICD-10-CM | POA: Diagnosis not present

## 2023-09-14 DIAGNOSIS — E2749 Other adrenocortical insufficiency: Secondary | ICD-10-CM | POA: Diagnosis not present

## 2023-09-14 DIAGNOSIS — E039 Hypothyroidism, unspecified: Secondary | ICD-10-CM | POA: Diagnosis not present

## 2023-09-14 DIAGNOSIS — Z9289 Personal history of other medical treatment: Secondary | ICD-10-CM | POA: Diagnosis not present

## 2023-09-14 DIAGNOSIS — E230 Hypopituitarism: Secondary | ICD-10-CM | POA: Diagnosis not present

## 2023-09-14 LAB — T4: T4, Total: 7.3 ug/dL (ref 4.9–10.5)

## 2023-09-14 LAB — T3: T3, Total: 103 ng/dL (ref 76–181)

## 2023-09-27 DIAGNOSIS — C44311 Basal cell carcinoma of skin of nose: Secondary | ICD-10-CM | POA: Diagnosis not present

## 2023-09-29 DIAGNOSIS — Z1331 Encounter for screening for depression: Secondary | ICD-10-CM | POA: Diagnosis not present

## 2023-09-29 DIAGNOSIS — F331 Major depressive disorder, recurrent, moderate: Secondary | ICD-10-CM | POA: Diagnosis not present

## 2023-09-29 DIAGNOSIS — Z1330 Encounter for screening examination for mental health and behavioral disorders, unspecified: Secondary | ICD-10-CM | POA: Diagnosis not present

## 2023-09-29 DIAGNOSIS — R4589 Other symptoms and signs involving emotional state: Secondary | ICD-10-CM | POA: Diagnosis not present

## 2023-09-29 DIAGNOSIS — F411 Generalized anxiety disorder: Secondary | ICD-10-CM | POA: Diagnosis not present

## 2023-10-09 DIAGNOSIS — G4733 Obstructive sleep apnea (adult) (pediatric): Secondary | ICD-10-CM | POA: Diagnosis not present

## 2023-10-13 ENCOUNTER — Ambulatory Visit
Attending: Student in an Organized Health Care Education/Training Program | Admitting: Student in an Organized Health Care Education/Training Program

## 2023-10-13 ENCOUNTER — Encounter: Payer: Self-pay | Admitting: Student in an Organized Health Care Education/Training Program

## 2023-10-13 VITALS — BP 128/86 | HR 81 | Temp 97.3°F | Resp 16 | Ht 68.0 in | Wt 235.0 lb

## 2023-10-13 DIAGNOSIS — G620 Drug-induced polyneuropathy: Secondary | ICD-10-CM | POA: Diagnosis not present

## 2023-10-13 DIAGNOSIS — G894 Chronic pain syndrome: Secondary | ICD-10-CM | POA: Insufficient documentation

## 2023-10-13 DIAGNOSIS — G8929 Other chronic pain: Secondary | ICD-10-CM | POA: Insufficient documentation

## 2023-10-13 DIAGNOSIS — T451X5A Adverse effect of antineoplastic and immunosuppressive drugs, initial encounter: Secondary | ICD-10-CM | POA: Insufficient documentation

## 2023-10-13 DIAGNOSIS — M5442 Lumbago with sciatica, left side: Secondary | ICD-10-CM | POA: Insufficient documentation

## 2023-10-13 DIAGNOSIS — M51362 Other intervertebral disc degeneration, lumbar region with discogenic back pain and lower extremity pain: Secondary | ICD-10-CM | POA: Diagnosis not present

## 2023-10-13 DIAGNOSIS — M5416 Radiculopathy, lumbar region: Secondary | ICD-10-CM | POA: Diagnosis not present

## 2023-10-13 MED ORDER — TRAMADOL HCL 50 MG PO TABS: 50.0000 mg | ORAL_TABLET | Freq: Two times a day (BID) | ORAL | 1 refills | Status: AC | PRN

## 2023-10-13 NOTE — Progress Notes (Signed)
 PROVIDER NOTE: Interpretation of information contained herein should be left to medically-trained personnel. Specific patient instructions are provided elsewhere under "Patient Instructions" section of medical record. This document was created in part using AI and STT-dictation technology, any transcriptional errors that may result from this process are unintentional.  Patient: Jose Hayes  Service: E/M   PCP: Jacklin Mascot, MD  DOB: 1961-04-10  DOS: 10/13/2023  Provider: Cephus Collin, MD  MRN: [id number]  Delivery: Face-to-face  Specialty: Interventional Pain Management  Type: Established Patient  Setting: Ambulatory outpatient facility  Specialty designation: 09  Referring Prov.: Bair, Randa Burton, MD  Location: Outpatient office facility       History of present illness (HPI) Jose Hayes, a 63 y.o. year old male, is here today because of his Degeneration of intervertebral disc of lumbar region with discogenic back pain and lower extremity pain [M51.362]. Jose Hayes primary complain today is Foot Pain (Bilateral, left is worse )    Pain Assessment: Severity of Chronic pain is reported as a 6 /10. Location: Foot (leg left, knee down) Left, Right/? up the leg. Onset: More than a month ago. Quality: Numbness, Tingling, Constant, Discomfort, Sharp. Timing: Constant. Modifying factor(s): feels that gabapentin  is starting to help foot pain,. Vitals:  height is 5\' 8"  (1.727 m) and weight is 235 lb (106.6 kg). His temporal temperature is 97.3 F (36.3 C) (abnormal). His blood pressure is 128/86 and his pulse is 81. His respiration is 16 and oxygen saturation is 96%.  BMI: Estimated body mass index is 35.73 kg/m as calculated from the following:   Height as of this encounter: 5\' 8"  (1.727 m).   Weight as of this encounter: 235 lb (106.6 kg).  Last encounter: 12/28/2022. Last procedure: Visit date not found.  Reason for encounter:  History of Present Illness   Jose Hayes is a  63 year old male with neuropathy and spondylolisthesis who presents with worsening foot pain.  He has a history of neuropathy following cancer treatment, with persistent symptoms in his hands and right foot, and more severe symptoms in his left foot. He associates the left foot pain with previous episodes of sciatica. Recently, he experienced a significant increase in pain after twisting his torso while shopping, describing the sensation as if his foot 'disconnected' and rating the pain as ten out of ten. Despite the severity, there was no swelling observed.  He has a history of spondylolisthesis at L5-S1 and a past disc herniation approximately 25 years ago. He experiences tingling on the outside of his calf, which he is unsure if it is due to neuropathy from cancer treatment or residual effects from his back issues. He notes that his back pain has decreased, but the foot pain persists, causing him to walk on his heel.  He started taking a higher dose of gabapentin  three weeks ago, reaching 600 mg daily, divided into 100 mg at noon, 100 mg at dinner, and 300 mg at bedtime. Initially, he did not notice improvement, but recently he has experienced some relief. He also takes alpha lipoic acid and has increased his hydrocortisone  dose due to fatigue exacerbated by pain.  He describes the foot as feeling 'dead' since the incident in the grocery store, with occasional stabbing pains. This sensation affects his sleep and daily activities. He is unable to take NSAIDs and uses Tylenol  as needed. He is not currently using alprazolam , which allows for higher gabapentin  dosing without concern for serotonin syndrome.    Of  note, the patient saw me as a new patient on 12/28/2022 but was lost to follow-up due to other medical conditions that he was dealing with at the time.  During that encounter, a lumbar MRI was ordered however the patient was unable to complete it due to other things going on at the time        HPI from initial clinic visit: 12/28/2022 Jose Hayes is a pleasant 63 year old male who presents with a chief complaint of axial low back pain as well as left groin pain.  He states that his low back pain is chronic in nature and has been going on for many years.  He has a history of lumbar degenerative disc disease and lumbar spondylolisthesis.  Also pertinent to his medical history is a history of bilateral inguinal hernia surgery now with persistent and chronic left groin pain consistent with ilioinguinal/genitofemoral neuralgia.  He has had a full urologic workup which did not reveal any urologic issues for his left groin pain.  Of note he has a history of stomach cancer stage IV in 2018 and he received immunotherapy and chemotherapy.  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  He is on chronic steroids.  He also has a history of anxiety and PTSD.  He has seen psychiatry in the past and has tried various SSRIs and SNRIs.  He takes alprazolam  as needed for anxiety.  He also has a history of hypertension, hyperlipidemia, obstructive sleep apnea on CPAP as well as obesity.  He has not had any recent MRIs done.     Jose Hayes has been informed that this initial visit was an evaluation only.  On the follow up appointment I will go over the results, including ordered tests and available interventional therapies. At that time he will have the opportunity to decide whether to proceed with offered therapies or not. In the event that Jose Hayes prefers avoiding interventional options, this will conclude our involvement in the case.  Medication management recommendations may be provided upon request.  ROS  Constitutional: Denies any fever or chills Gastrointestinal: No reported hemesis, hematochezia, vomiting, or acute GI distress Musculoskeletal: Low back pain, mild with  radiation into legs left greater than right Neurological: No reported episodes of acute onset apraxia, aphasia, dysarthria, agnosia, amnesia, paralysis, loss of coordination, or loss of consciousness  Medication Review  ALPRAZolam , albuterol , amLODipine , calcium -vitamin D, esomeprazole , gabapentin , hydrocortisone , hydrocortisone  sodium succinate , levothyroxine , liothyronine, rosuvastatin , sertraline, testosterone  cypionate, and traMADol   History Review  Allergy: Jose Hayes has no known allergies. Drug: Jose Hayes  reports no history of drug use. Alcohol:  reports that he does not currently use alcohol. Tobacco:  reports that he has quit smoking. His smoking use included cigarettes. He has a 12.5 pack-year smoking history. He has never used smokeless tobacco. Social: Jose Hayes  reports that he has quit smoking. His smoking use included cigarettes. He has a 12.5 pack-year smoking history. He has never used smokeless tobacco. He reports that he does not currently use alcohol. He reports that he does not use drugs. Medical:  has a past medical history of Adrenal insufficiency (Addison's disease) (HCC), Allergy, Anxiety, Basal cell carcinoma, COVID-19, Esophageal cancer (HCC) (10/21/2016), GERD (gastroesophageal reflux disease), Hyperglycemia, Hyperlipidemia, Hypertension, Left acoustic neuroma (HCC), Sleep apnea, SVT (supraventricular tachycardia) (HCC), Thyroid  disease, and Tobacco abuse. Surgical: Jose Hayes  has a past surgical history that includes Shoulder  surgery (Right, 1985, 1991, 2000); Laparoscopic cholecystectomy (1991); Colonoscopy; Upper gastrointestinal endoscopy; swidom teeth extraction; and Knee arthroscopy (Right, 07/30/2021). Family: family history includes Clotting disorder in his father; Hypertension in his father and mother; Pancreatic cancer in his father.  Laboratory Chemistry Profile   Renal Lab Results  Component Value Date   BUN 21 09/13/2023   CREATININE 1.04  09/13/2023   BCR NOT APPLICABLE 12/19/2020   GFR 76.81 09/13/2023   GFRAA >60 11/06/2011   GFRNONAA >60 05/04/2022    Hepatic Lab Results  Component Value Date   AST 19 09/13/2023   ALT 21 09/13/2023   ALBUMIN 4.4 09/13/2023   ALKPHOS 48 09/13/2023   LIPASE 15.0 10/13/2016    Electrolytes Lab Results  Component Value Date   NA 139 09/13/2023   K 4.3 09/13/2023   CL 102 09/13/2023   CALCIUM  8.8 09/13/2023   MG 2.8 (H) 05/04/2022    Bone No results found for: "VD25OH", "VD125OH2TOT", "ZO1096EA5", "WU9811BJ4", "25OHVITD1", "25OHVITD2", "25OHVITD3", "TESTOFREE", "TESTOSTERONE "  Inflammation (CRP: Acute Phase) (ESR: Chronic Phase) Lab Results  Component Value Date   ESRSEDRATE 13 05/03/2022   LATICACIDVEN 1.5 05/03/2022         Note: Above Lab results reviewed.  Recent Imaging Review  LONG TERM MONITOR (3-14 DAYS)   70 Zio patch monitor (8/22-8/29/2024)   Patient was in sinus rhythm with a rate range of 34 to 112 bpm and an  average of 53 bpm.   Rare PACs and PVCs noted.  No couplets or triplets.  No bigeminy or  trigeminy.   Symptoms were noted during sinus rhythm with PACs.   Symptoms of upper body numbness noted with sinus rhythm and mild sinus  bradycardia.   Chest pain with walking as well as shortness of breath noted with sinus  bradycardia (with or without PACs.)   Most prominent sinus bradycardia was during sleeping hours roughly 3 AM  heart rate 34 bpm, 38 BPM at 3:43 AM   No Sustained Arrhythmias: Atrial Tachycardia (AT), Supraventricular  Tachycardia (SVT), Atrial Fibrillation (A-Fib), Atrial Flutter  (A-Flutter), Sustained Ventricular Tachycardia (VT)  Overall pretty normal study.  Did show slow heart rate which may be  related to sleep disordered breathing.  Results were reviewed with the patient, by Dr. Gollan in clinic.  Randene Bustard, MD Note: Reviewed         Physical Exam  General appearance: Well nourished, well developed, and well hydrated.  In no apparent acute distress Mental status: Alert, oriented x 3 (person, place, & time)       Respiratory: No evidence of acute respiratory distress Eyes: PERLA Vitals: BP 128/86 (BP Location: Left Arm, Patient Position: Sitting, Cuff Size: Normal)   Pulse 81   Temp (!) 97.3 F (36.3 C) (Temporal)   Resp 16   Ht 5\' 8"  (1.727 m)   Wt 235 lb (106.6 kg)   SpO2 96%   BMI 35.73 kg/m  BMI: Estimated body mass index is 35.73 kg/m as calculated from the following:   Height as of this encounter: 5\' 8"  (1.727 m).   Weight as of this encounter: 235 lb (106.6 kg). Ideal: Ideal body weight: 68.4 kg (150 lb 12.7 oz) Adjusted ideal body weight: 83.7 kg (184 lb 7.6 oz) yeah  Lumbar Spine Area Exam  Skin & Axial Inspection: No masses, redness, or swelling Alignment: Symmetrical Functional ROM: Pain restricted ROM affecting both sides Stability: No instability detected Muscle Tone/Strength: Functionally intact. No obvious neuro-muscular anomalies detected. Sensory (Neurological):  Dermatomal pain pattern, neurogenic, MSK Palpation: No palpable anomalies         Ambulation: Unassisted Gait: Relatively normal for age and body habitus Posture: WNL  Lower Extremity Exam      Side: Right lower extremity   Side: Left lower extremity  Stability: No instability observed           Stability: No instability observed          Skin & Extremity Inspection: Skin color, temperature, and hair growth are WNL. No peripheral edema or cyanosis. No masses, redness, swelling, asymmetry, or associated skin lesions. No contractures.   Skin & Extremity Inspection: Skin color, temperature, and hair growth are WNL. No peripheral edema or cyanosis. No masses, redness, swelling, asymmetry, or associated skin lesions. No contractures.  Functional ROM: Unrestricted ROM                   Functional ROM: Unrestricted ROM                  Muscle Tone/Strength: Functionally intact. No obvious neuro-muscular anomalies detected.    Muscle Tone/Strength: Functionally intact. No obvious neuro-muscular anomalies detected.  Sensory (Neurological): Unimpaired         Sensory (Neurological): left groin pain        DTR: Patellar: deferred today Achilles: deferred today Plantar: deferred today   DTR: Patellar: deferred today Achilles: deferred today Plantar: deferred today  Palpation: No palpable anomalies   Palpation: No palpable anomalies    Assessment   Diagnosis Status  1. Degeneration of intervertebral disc of lumbar region with discogenic back pain and lower extremity pain   2. Chronic radicular lumbar pain   3. Chemotherapy-induced neuropathy (HCC)   4. Chronic bilateral low back pain with left-sided sciatica   5. Chronic pain syndrome    Controlled Controlled Controlled   Updated Problems: No problems updated.  Plan of Care  Problem-specific:  Assessment and Plan    Chemotherapy-induced neuropathy   Chronic neuropathy affects his left foot, with a history of neuropathy in his hands and right foot. A recent twisting injury exacerbated symptoms, causing severe pain, a sensation of disconnection, and occasional stabbing pains. Gabapentin  has been increased to 600 mg daily, showing some improvement. Differential diagnosis includes spinal compression or peripheral nerve issues. An MRI of the spine is ordered to assess for spinal compression. If negative, an EMG may evaluate peripheral nerve function. Continue gabapentin  at the current dose. Consider tramadol  50 mg every 12 hours for pain, with an option for 100 mg if needed. Avoid concurrent use of alprazolam  with tramadol .  He states that he is not using alprazolam  any longer.  Complete baseline urine toxicology screen and patient will sign controlled substance agreement.  Sciatica   Sciatica symptoms may relate to spinal issues, with previous L5-S1 spondylolisthesis and disc herniation noted. Current symptoms include tingling on the outside of the calf, possibly  linked to past nerve damage or chemotherapy-induced neuropathy. An MRI of the spine is ordered to assess for nerve compression related to sciatica.  Spondylolisthesis, L5-S1   Chronic spondylolisthesis with disc herniation at L5-S1 may contribute to neuropathy and sciatica symptoms. An MRI of the spine is ordered to evaluate the status of spondylolisthesis and any related nerve compression.  REM sleep behavior disorder   Ongoing REM sleep behavior disorder raises concerns about medication interactions. The current sertraline dose remains unchanged due to potential exacerbation of the disorder.  Basal cell carcinoma   He is scheduled  for Mohs surgery in three weeks to remove basal cell carcinoma on the face, which may complicate the procedure. Proceed with the scheduled surgery.       Jose Hayes has a current medication list which includes the following long-term medication(s): albuterol , amlodipine , calcium -vitamin d, esomeprazole , gabapentin , gabapentin , levothyroxine , liothyronine, sertraline, and testosterone  cypionate.  Pharmacotherapy (Medications Ordered): Meds ordered this encounter  Medications   traMADol  (ULTRAM ) 50 MG tablet    Sig: Take 1 tablet (50 mg total) by mouth every 12 (twelve) hours as needed for severe pain (pain score 7-10).    Dispense:  60 tablet    Refill:  1    Fill one day early if pharmacy is closed on scheduled refill date.   Orders:  Orders Placed This Encounter  Procedures   MR LUMBAR SPINE WO CONTRAST    Patient presents with axial pain with possible radicular component. Please assist us  in identifying specific level(s) and laterality of any additional findings such as: 1. Facet (Zygapophyseal) joint DJD (Hypertrophy, space narrowing, subchondral sclerosis, and/or osteophyte formation) 2. DDD and/or IVDD (Loss of disc height, desiccation, gas patterns, osteophytes, endplate sclerosis, or "Black disc disease") 3. Pars defects 4.  Spondylolisthesis, spondylosis, and/or spondyloarthropathies (include Degree/Grade of displacement in mm) (stability) 5. Vertebral body Fractures (acute/chronic) (state percentage of collapse) 6. Demineralization (osteopenia/osteoporotic) 7. Bone pathology 8. Foraminal narrowing  9. Surgical changes 10. Central, Lateral Recess, and/or Foraminal Stenosis (include AP diameter of stenosis in mm) 11. Surgical changes (hardware type, status, and presence of fibrosis) 12. Modic Type Changes (MRI only) 13. IVDD (Disc bulge, protrusion, herniation, extrusion) (Level, laterality, extent)    Standing Status:   Future    Expiration Date:   01/13/2024    Scheduling Instructions:     Please make sure that the patient understands that this needs to be done as soon as possible. Never have the patient do the imaging "just before the next appointment". Inform patient that having the imaging done within the [facility] will expedite the availability of the results and will provide      imaging availability to the requesting physician. In addition inform the patient that the imaging order has an expiration date and will not be renewed if not done within the active period.    What is the patient's sedation requirement?:   No Sedation    Does the patient have a pacemaker or implanted devices?:   No    Preferred imaging location?:   [facility] (table limit-350lbs)    Call Results- Best Contact Number?:   [phone] [facility]    Radiology Contrast Protocol - do NOT remove file path:   [email or web address]   Compliance Drug Analysis, Ur    Volume: 30 ml(s). Minimum 3 ml of urine is needed. Document temperature of fresh sample. Indications: Long term (current) use of opiate analgesic (X91.478) Test#: [id number] (Comprehensive Profile)    Release to patient:   Immediate    Return for patient will call to schedule F2F appt prn.     Recent Visits No visits were found meeting these conditions. Showing recent visits within past 90 days and meeting all other requirements Today's Visits Date Type Provider Dept  10/13/23 Office Visit Cephus Collin, MD [facility]  Showing today's visits and meeting all other requirements Future Appointments No visits were found meeting these conditions. Showing future appointments within next 90 days and meeting all other requirements  I discussed the  assessment and treatment plan with the patient. The patient was provided an opportunity to ask questions and all were answered. The patient agreed with the plan and demonstrated an understanding of the instructions.  Patient advised to call back or seek an in-person evaluation if the symptoms or condition worsens.  Duration of encounter: .  Total time on encounter, as per AMA guidelines included both the face-to-face and non-face-to-face time personally spent by the physician and/or other qualified health care professional(s) on the day of the encounter (includes time in activities that require the physician or other qualified health care professional and does not include time in activities normally performed by clinical staff). Physician's time may include the following activities when performed: Preparing to see the patient (e.g., pre-charting review of records, searching for previously ordered imaging, lab work, and nerve conduction tests) Review of prior analgesic pharmacotherapies. Reviewing PMP Interpreting ordered tests (e.g., lab work, imaging, nerve conduction tests) Performing post-procedure evaluations, including interpretation of diagnostic procedures Obtaining and/or reviewing separately obtained history Performing a medically appropriate examination and/or evaluation Counseling and educating the patient/family/caregiver Ordering medications, tests, or procedures Referring and communicating with other health  care professionals (when not separately reported) Documenting clinical information in the electronic or other health record Independently interpreting results (not separately reported) and communicating results to the patient/ family/caregiver Care coordination (not separately reported)  Note by: Cephus Collin, MD (TTS and AI technology used. I apologize for any typographical errors that were not detected and corrected.) Date: 10/13/2023; Time: 2:27 PM

## 2023-10-13 NOTE — Patient Instructions (Signed)

## 2023-10-13 NOTE — Progress Notes (Signed)
 Safety precautions to be maintained throughout the outpatient stay will include: orient to surroundings, keep bed in low position, maintain call bell within reach at all times, provide assistance with transfer out of bed and ambulation.

## 2023-10-18 LAB — COMPLIANCE DRUG ANALYSIS, UR

## 2023-10-19 DIAGNOSIS — Z9221 Personal history of antineoplastic chemotherapy: Secondary | ICD-10-CM | POA: Diagnosis not present

## 2023-10-19 DIAGNOSIS — G629 Polyneuropathy, unspecified: Secondary | ICD-10-CM | POA: Diagnosis not present

## 2023-10-19 DIAGNOSIS — Z79890 Hormone replacement therapy (postmenopausal): Secondary | ICD-10-CM | POA: Diagnosis not present

## 2023-10-19 DIAGNOSIS — K2100 Gastro-esophageal reflux disease with esophagitis, without bleeding: Secondary | ICD-10-CM | POA: Diagnosis not present

## 2023-10-19 DIAGNOSIS — D124 Benign neoplasm of descending colon: Secondary | ICD-10-CM | POA: Diagnosis not present

## 2023-10-19 DIAGNOSIS — Z79899 Other long term (current) drug therapy: Secondary | ICD-10-CM | POA: Diagnosis not present

## 2023-10-19 DIAGNOSIS — D123 Benign neoplasm of transverse colon: Secondary | ICD-10-CM | POA: Diagnosis not present

## 2023-10-19 DIAGNOSIS — E079 Disorder of thyroid, unspecified: Secondary | ICD-10-CM | POA: Diagnosis not present

## 2023-10-19 DIAGNOSIS — I10 Essential (primary) hypertension: Secondary | ICD-10-CM | POA: Diagnosis not present

## 2023-10-19 DIAGNOSIS — Z09 Encounter for follow-up examination after completed treatment for conditions other than malignant neoplasm: Secondary | ICD-10-CM | POA: Diagnosis not present

## 2023-10-19 DIAGNOSIS — K5730 Diverticulosis of large intestine without perforation or abscess without bleeding: Secondary | ICD-10-CM | POA: Diagnosis not present

## 2023-10-19 DIAGNOSIS — Z6835 Body mass index (BMI) 35.0-35.9, adult: Secondary | ICD-10-CM | POA: Diagnosis not present

## 2023-10-19 DIAGNOSIS — Z860100 Personal history of colon polyps, unspecified: Secondary | ICD-10-CM | POA: Diagnosis not present

## 2023-10-19 DIAGNOSIS — G4733 Obstructive sleep apnea (adult) (pediatric): Secondary | ICD-10-CM | POA: Diagnosis not present

## 2023-10-19 DIAGNOSIS — K2270 Barrett's esophagus without dysplasia: Secondary | ICD-10-CM | POA: Diagnosis not present

## 2023-10-19 DIAGNOSIS — Z1211 Encounter for screening for malignant neoplasm of colon: Secondary | ICD-10-CM | POA: Diagnosis not present

## 2023-10-19 DIAGNOSIS — E66812 Obesity, class 2: Secondary | ICD-10-CM | POA: Diagnosis not present

## 2023-10-19 DIAGNOSIS — K3189 Other diseases of stomach and duodenum: Secondary | ICD-10-CM | POA: Diagnosis not present

## 2023-10-19 DIAGNOSIS — K635 Polyp of colon: Secondary | ICD-10-CM | POA: Diagnosis not present

## 2023-10-19 DIAGNOSIS — K219 Gastro-esophageal reflux disease without esophagitis: Secondary | ICD-10-CM | POA: Diagnosis not present

## 2023-10-19 DIAGNOSIS — Z8501 Personal history of malignant neoplasm of esophagus: Secondary | ICD-10-CM | POA: Diagnosis not present

## 2023-10-25 ENCOUNTER — Ambulatory Visit
Admission: RE | Admit: 2023-10-25 | Discharge: 2023-10-25 | Disposition: A | Discharge status: Home / Self Care | Source: Ambulatory Visit | Attending: Student in an Organized Health Care Education/Training Program | Admitting: Student in an Organized Health Care Education/Training Program

## 2023-10-25 DIAGNOSIS — M4316 Spondylolisthesis, lumbar region: Secondary | ICD-10-CM | POA: Diagnosis not present

## 2023-10-25 DIAGNOSIS — M2578 Osteophyte, vertebrae: Secondary | ICD-10-CM | POA: Diagnosis not present

## 2023-10-25 DIAGNOSIS — M51362 Other intervertebral disc degeneration, lumbar region with discogenic back pain and lower extremity pain: Secondary | ICD-10-CM

## 2023-10-25 DIAGNOSIS — M4726 Other spondylosis with radiculopathy, lumbar region: Secondary | ICD-10-CM | POA: Diagnosis not present

## 2023-10-25 DIAGNOSIS — T451X5A Adverse effect of antineoplastic and immunosuppressive drugs, initial encounter: Secondary | ICD-10-CM

## 2023-10-25 DIAGNOSIS — G894 Chronic pain syndrome: Secondary | ICD-10-CM

## 2023-10-25 DIAGNOSIS — G8929 Other chronic pain: Secondary | ICD-10-CM

## 2023-10-26 ENCOUNTER — Encounter: Payer: Self-pay | Admitting: Student in an Organized Health Care Education/Training Program

## 2023-11-07 ENCOUNTER — Ambulatory Visit
Attending: Student in an Organized Health Care Education/Training Program | Admitting: Student in an Organized Health Care Education/Training Program

## 2023-11-07 ENCOUNTER — Encounter: Payer: Self-pay | Admitting: Student in an Organized Health Care Education/Training Program

## 2023-11-07 ENCOUNTER — Ambulatory Visit (INDEPENDENT_AMBULATORY_CARE_PROVIDER_SITE_OTHER): Payer: Medicare HMO | Admitting: *Deleted

## 2023-11-07 VITALS — BP 139/90 | HR 63 | Temp 98.4°F | Resp 18 | Ht 68.0 in | Wt 240.0 lb

## 2023-11-07 VITALS — BP 122/80 | Ht 68.0 in | Wt 240.0 lb

## 2023-11-07 DIAGNOSIS — Z0000 Encounter for general adult medical examination without abnormal findings: Secondary | ICD-10-CM

## 2023-11-07 DIAGNOSIS — G8929 Other chronic pain: Secondary | ICD-10-CM | POA: Diagnosis not present

## 2023-11-07 DIAGNOSIS — M5416 Radiculopathy, lumbar region: Secondary | ICD-10-CM | POA: Insufficient documentation

## 2023-11-07 NOTE — Patient Instructions (Signed)
 Moderate Conscious Sedation, Adult Sedation is the use of medicines to help you relax and not feel pain. Moderate conscious sedation is a type of sedation that makes you less alert than normal. You are still able to respond to instructions, touch, or both. This type of sedation is used during short medical and dental procedures. It is milder than deep sedation, which is a type of sedation you cannot be easily woken up from. It is also milder than general anesthesia, which is the use of medicines to make you fall asleep. Moderate conscious sedation lets you return to your normal activities sooner. Tell a health care provider about: Any allergies you have. All medicines you are taking, including vitamins, herbs, steroids, eye drops, creams, and over-the-counter medicines. Any problems you or family members have had with anesthesia. Any bleeding problems you have. Any surgeries you have had. Any medical conditions you have. Whether you are pregnant or may be pregnant. Any recent alcohol, tobacco, or drug use. What are the risks? Your health care provider will talk with you about risks. These may include: Oversedation. This is when you get too much medicine. Nausea or vomiting. Allergic reaction to medicines. Trouble breathing. If this happens, a breathing tube may be used. It will be removed when you can breathe better on your own. Heart trouble. Lung trouble. Emergence delirium. This is when you feel confused while the sedation wears off. This gets better with time. What happens before the procedure? When to stop eating and drinking Follow instructions from your health care provider about what you may eat and drink. These may include: 8 hours before your procedure Stop eating most foods. Do not eat meat, fried foods, or fatty foods. Eat only light foods, such as toast or crackers. All liquids are okay except energy drinks and alcohol. 6 hours before your procedure Stop eating. Drink only  clear liquids, such as water, clear fruit juice, black coffee, plain tea, and sports drinks. Do not drink energy drinks or alcohol. 2 hours before your procedure Stop drinking all liquids. You may be allowed to take medicines with small sips of water. If you do not follow your health care provider's instructions, your procedure may be delayed or canceled. Medicines Ask your health care provider about: Changing or stopping your regular medicines. These include any diabetes medicines or blood thinners you take. Taking medicines such as aspirin and ibuprofen. These medicines can thin your blood. Do not take them unless your health care provider tells you to. Taking over-the-counter medicines, vitamins, herbs, and supplements. Tests and exams You may have an exam or testing. You may have a blood or urine sample taken. General instructions Do not use any products that contain nicotine or tobacco for at least 4 weeks before the procedure. These products include cigarettes, chewing tobacco, and vaping devices, such as e-cigarettes. If you need help quitting, ask your health care provider. If you will be going home right after the procedure, plan to have a responsible adult: Take you home from the hospital or clinic. You will not be allowed to drive. Care for you for the time you are told. What happens during the procedure?  You will be given the sedative. It may be given: As a pill you can take by mouth. It can also be put into the rectum. As a spray through the nose. As an injection into muscle. As an injection into a vein through an IV. You may be given oxygen as needed. Your blood pressure, heart  rate, breathing rate, and blood oxygen level will be monitored during the procedure. The medical or dental procedure will be done. The procedure may vary among health care providers and hospitals. What happens after the procedure? Your blood pressure, heart rate, breathing rate, and blood oxygen  level will be monitored until you leave the hospital or clinic. You will get fluids through an IV as needed. Do not drive or operate machinery until your health care provider says that it is safe. This information is not intended to replace advice given to you by your health care provider. Make sure you discuss any questions you have with your health care provider. Document Revised: 11/09/2021 Document Reviewed: 11/09/2021 [employer] Patient Education  2024 [employer]GENERAL RISKS AND COMPLICATIONS  What are the risk, side effects and possible complications? Generally speaking, most procedures are safe.  However, with any procedure there are risks, side effects, and the possibility of complications.  The risks and complications are dependent upon the sites that are lesioned, or the type of nerve block to be performed.  The closer the procedure is to the spine, the more serious the risks are.  Great care is taken when placing the radio frequency needles, block needles or lesioning probes, but sometimes complications can occur. Infection: Any time there is an injection through the skin, there is a risk of infection.  This is why sterile conditions are used for these blocks.  There are four possible types of infection. Localized skin infection. Central Nervous System Infection-This can be in the form of Meningitis, which can be deadly. Epidural Infections-This can be in the form of an epidural abscess, which can cause pressure inside of the spine, causing compression of the spinal cord with subsequent paralysis. This would require an emergency surgery to decompress, and there are no guarantees that the patient would recover from the paralysis. Discitis-This is an infection of the intervertebral discs.  It occurs in about 1% of discography procedures.  It is difficult to treat and it may lead to surgery.        2. Pain: the needles have to go through skin and soft tissues, will cause soreness.        3. Damage to internal structures:  The nerves to be lesioned may be near blood vessels or    other nerves which can be potentially damaged.       4. Bleeding: Bleeding is more common if the patient is taking blood thinners such as  aspirin, Coumadin, Ticiid, Plavix, etc., or if he/she have some genetic predisposition  such as hemophilia. Bleeding into the spinal canal can cause compression of the spinal  cord with subsequent paralysis.  This would require an emergency surgery to  decompress and there are no guarantees that the patient would recover from the  paralysis.       5. Pneumothorax:  Puncturing of a lung is a possibility, every time a needle is introduced in  the area of the chest or upper back.  Pneumothorax refers to free air around the  collapsed lung(s), inside of the thoracic cavity (chest cavity).  Another two possible  complications related to a similar event would include: Hemothorax and Chylothorax.   These are variations of the Pneumothorax, where instead of air around the collapsed  lung(s), you may have blood or chyle, respectively.       6. Spinal headaches: They may occur with any procedures in the area of the spine.       7.  Persistent CSF (Cerebro-Spinal Fluid) leakage: This is a rare problem, but may occur  with prolonged intrathecal or epidural catheters either due to the formation of a fistulous  track or a dural tear.       8. Nerve damage: By working so close to the spinal cord, there is always a possibility of  nerve damage, which could be as serious as a permanent spinal cord injury with  paralysis.       9. Death:  Although rare, severe deadly allergic reactions known as "Anaphylactic  reaction" can occur to any of the medications used.      10. Worsening of the symptoms:  We can always make thing worse.  What are the chances of something like this happening? Chances of any of this occuring are extremely low.  By statistics, you have more of a chance of getting killed in a  motor vehicle accident: while driving to the hospital than any of the above occurring .  Nevertheless, you should be aware that they are possibilities.  In general, it is similar to taking a shower.  Everybody knows that you can slip, hit your head and get killed.  Does that mean that you should not shower again?  Nevertheless always keep in mind that statistics do not mean anything if you happen to be on the wrong side of them.  Even if a procedure has a 1 (one) in a 1,000,000 (million) chance of going wrong, it you happen to be that one..Also, keep in mind that by statistics, you have more of a chance of having something go wrong when taking medications.  Who should not have this procedure? If you are on a blood thinning medication (e.g. Coumadin, Plavix, see list of "Blood Thinners"), or if you have an active infection going on, you should not have the procedure.  If you are taking any blood thinners, please inform your physician.  How should I prepare for this procedure? Do not eat or drink anything at least six hours prior to the procedure. Bring a driver with you .  It cannot be a taxi. Come accompanied by an adult that can drive you back, and that is strong enough to help you if your legs get weak or numb from the local anesthetic. Take all of your medicines the morning of the procedure with just enough water to swallow them. If you have diabetes, make sure that you are scheduled to have your procedure done first thing in the morning, whenever possible. If you have diabetes, take only half of your insulin dose and notify our nurse that you have done so as soon as you arrive at the clinic. If you are diabetic, but only take blood sugar pills (oral hypoglycemic), then do not take them on the morning of your procedure.  You may take them after you have had the procedure. Do not take aspirin or any aspirin-containing medications, at least eleven (11) days prior to the procedure.  They may prolong  bleeding. Wear loose fitting clothing that may be easy to take off and that you would not mind if it got stained with Betadine or blood. Do not wear any jewelry or perfume Remove any nail coloring.  It will interfere with some of our monitoring equipment.  NOTE: Remember that this is not meant to be interpreted as a complete list of all possible complications.  Unforeseen problems may occur.  BLOOD THINNERS The following drugs contain aspirin or other products, which can cause increased  bleeding during surgery and should not be taken for 2 weeks prior to and 1 week after surgery.  If you should need take something for relief of minor pain, you may take acetaminophen which is found in Tylenol,m Datril, Anacin-3 and Panadol. It is not blood thinner. The products listed below are.  Do not take any of the products listed below in addition to any listed on your instruction sheet.  A.P.C or A.P.C with Codeine Codeine Phosphate Capsules #3 Ibuprofen Ridaura  ABC compound Congesprin Imuran rimadil  Advil Cope Indocin Robaxisal  Alka-Seltzer Effervescent Pain Reliever and Antacid Coricidin or Coricidin-D  Indomethacin Rufen  Alka-Seltzer plus Cold Medicine Cosprin Ketoprofen S-A-C Tablets  Anacin Analgesic Tablets or Capsules Coumadin Korlgesic Salflex  Anacin Extra Strength Analgesic tablets or capsules CP-2 Tablets Lanoril Salicylate  Anaprox Cuprimine Capsules Levenox Salocol  Anexsia-D Dalteparin Magan Salsalate  Anodynos Darvon compound Magnesium Salicylate Sine-off  Ansaid Dasin Capsules Magsal Sodium Salicylate  Anturane Depen Capsules Marnal Soma  APF Arthritis pain formula Dewitt's Pills Measurin Stanback  Argesic Dia-Gesic Meclofenamic Sulfinpyrazone  Arthritis Bayer Timed Release Aspirin Diclofenac Meclomen Sulindac  Arthritis pain formula Anacin Dicumarol Medipren Supac  Analgesic (Safety coated) Arthralgen Diffunasal Mefanamic Suprofen  Arthritis Strength Bufferin Dihydrocodeine Mepro  Compound Suprol  Arthropan liquid Dopirydamole Methcarbomol with Aspirin Synalgos  ASA tablets/Enseals Disalcid Micrainin Tagament  Ascriptin Doan's Midol Talwin  Ascriptin A/D Dolene Mobidin Tanderil  Ascriptin Extra Strength Dolobid Moblgesic Ticlid  Ascriptin with Codeine Doloprin or Doloprin with Codeine Momentum Tolectin  Asperbuf Duoprin Mono-gesic Trendar  Aspergum Duradyne Motrin or Motrin IB Triminicin  Aspirin plain, buffered or enteric coated Durasal Myochrisine Trigesic  Aspirin Suppositories Easprin Nalfon Trillsate  Aspirin with Codeine Ecotrin Regular or Extra Strength Naprosyn Uracel  Atromid-S Efficin Naproxen Ursinus  Auranofin Capsules Elmiron Neocylate Vanquish  Axotal Emagrin Norgesic Verin  Azathioprine Empirin or Empirin with Codeine Normiflo Vitamin E  Azolid Emprazil Nuprin Voltaren  Bayer Aspirin plain, buffered or children's or timed BC Tablets or powders Encaprin Orgaran Warfarin Sodium  Buff-a-Comp Enoxaparin Orudis Zorpin  Buff-a-Comp with Codeine Equegesic Os-Cal-Gesic   Buffaprin Excedrin plain, buffered or Extra Strength Oxalid   Bufferin Arthritis Strength Feldene Oxphenbutazone   Bufferin plain or Extra Strength Feldene Capsules Oxycodone with Aspirin   Bufferin with Codeine Fenoprofen Fenoprofen Pabalate or Pabalate-SF   Buffets II Flogesic Panagesic   Buffinol plain or Extra Strength Florinal or Florinal with Codeine Panwarfarin   Buf-Tabs Flurbiprofen Penicillamine   Butalbital Compound Four-way cold tablets Penicillin   Butazolidin Fragmin Pepto-Bismol   Carbenicillin Geminisyn Percodan   Carna Arthritis Reliever Geopen Persantine   Carprofen Gold's salt Persistin   Chloramphenicol Goody's Phenylbutazone   Chloromycetin Haltrain Piroxlcam   Clmetidine heparin Plaquenil   Cllnoril Hyco-pap Ponstel   Clofibrate Hydroxy chloroquine Propoxyphen         Before stopping any of these medications, be sure to consult the physician who ordered them.   Some, such as Coumadin (Warfarin) are ordered to prevent or treat serious conditions such as "deep thrombosis", "pumonary embolisms", and other heart problems.  The amount of time that you may need off of the medication may also vary with the medication and the reason for which you were taking it.  If you are taking any of these medications, please make sure you notify your pain physician before you undergo any procedures.         Epidural Steroid Injection Patient Information  Description: The epidural space surrounds the nerves as they  exit the spinal cord.  In some patients, the nerves can be compressed and inflamed by a bulging disc or a tight spinal canal (spinal stenosis).  By injecting steroids into the epidural space, we can bring irritated nerves into direct contact with a potentially helpful medication.  These steroids act directly on the irritated nerves and can reduce swelling and inflammation which often leads to decreased pain.  Epidural steroids may be injected anywhere along the spine and from the neck to the low back depending upon the location of your pain.   After numbing the skin with local anesthetic (like Novocaine), a small needle is passed into the epidural space slowly.  You may experience a sensation of pressure while this is being done.  The entire block usually last less than 10 minutes.  Conditions which may be treated by epidural steroids:  Low back and leg pain Neck and arm pain Spinal stenosis Post-laminectomy syndrome Herpes zoster (shingles) pain Pain from compression fractures  Preparation for the injection:  Do not eat any solid food or dairy products within 8 hours of your appointment.  You may drink clear liquids up to 3 hours before appointment.  Clear liquids include water, black coffee, juice or soda.  No milk or cream please. You may take your regular medication, including pain medications, with a sip of water before your appointment  Diabetics  should hold regular insulin (if taken separately) and take 1/2 normal NPH dos the morning of the procedure.  Carry some sugar containing items with you to your appointment. A driver must accompany you and be prepared to drive you home after your procedure.  Bring all your current medications with your. An IV may be inserted and sedation may be given at the discretion of the physician.   A blood pressure cuff, EKG and other monitors will often be applied during the procedure.  Some patients may need to have extra oxygen administered for a short period. You will be asked to provide medical information, including your allergies, prior to the procedure.  We must know immediately if you are taking blood thinners (like Coumadin/Warfarin)  Or if you are allergic to IV iodine contrast (dye). We must know if you could possible be pregnant.  Possible side-effects: Bleeding from needle site Infection (rare, may require surgery) Nerve injury (rare) Numbness & tingling (temporary) Difficulty urinating (rare, temporary) Spinal headache ( a headache worse with upright posture) Light -headedness (temporary) Pain at injection site (several days) Decreased blood pressure (temporary) Weakness in arm/leg (temporary) Pressure sensation in back/neck (temporary)  Call if you experience: Fever/chills associated with headache or increased back/neck pain. Headache worsened by an upright position. New onset weakness or numbness of an extremity below the injection site Hives or difficulty breathing (go to the emergency room) Inflammation or drainage at the infection site Severe back/neck pain Any new symptoms which are concerning to you  Please note:  Although the local anesthetic injected can often make your back or neck feel good for several hours after the injection, the pain will likely return.  It takes 3-7 days for steroids to work in the epidural space.  You may not notice any pain relief for at least that  one week.  If effective, we will often do a series of three injections spaced 3-6 weeks apart to maximally decrease your pain.  After the initial series, we generally will wait several months before considering a repeat injection of the same type.  If you have any questions,  please call [phone] [facility]

## 2023-11-07 NOTE — Progress Notes (Signed)
 Subjective:   Jose Hayes is a 63 y.o. who presents for a Medicare Wellness preventive visit.  As a reminder, Annual Wellness Visits don't include a physical exam, and some assessments may be limited, especially if this visit is performed virtually. We may recommend an in-person follow-up visit with your provider if needed.  Visit Complete: Virtual I connected with  Jose Hayes on 11/07/23 by a audio enabled telemedicine application and verified that I am speaking with the correct person using two identifiers.  Patient Location: Home  Provider Location: Home Office  I discussed the limitations of evaluation and management by telemedicine. The patient expressed understanding and agreed to proceed.  Vital Signs: Because this visit was a virtual/telehealth visit, some criteria may be missing or patient reported. Any vitals not documented were not able to be obtained and vitals that have been documented are patient reported.  VideoDeclined- This patient declined Librarian, academic. Therefore the visit was completed with audio only.  Persons Participating in Visit: Patient.  AWV Questionnaire: No: Patient Medicare AWV questionnaire was not completed prior to this visit.  Cardiac Risk Factors include: advanced age (>40men, >62 women);male gender;hypertension;dyslipidemia;obesity (BMI >30kg/m2)     Objective:    Today's Vitals   11/07/23 1011 11/07/23 1012  BP: 122/80   Weight: 240 lb (108.9 kg)   Height: 5' 8 (1.727 m)   PainSc:  6    Body mass index is 36.49 kg/m.     11/07/2023   10:34 AM 10/13/2023    1:14 PM 12/28/2022   11:39 AM 11/02/2022   10:59 AM 07/30/2021   12:13 PM 06/26/2021   10:25 AM 05/22/2020   12:02 PM  Advanced Directives  Does Patient Have a Medical Advance Directive? No No No No No No Yes  Does patient want to make changes to medical advance directive?       No - Patient declined  Would patient like information on  creating a medical advance directive? No - Patient declined  No - Patient declined  No - Patient declined No - Patient declined     Current Medications (verified) Outpatient Encounter Medications as of 11/07/2023  Medication Sig   albuterol  (VENTOLIN  HFA) 108 (90 Base) MCG/ACT inhaler Inhale 2 puffs into the lungs every 4 (four) hours as needed.   ALPRAZolam  (XANAX ) 1 MG tablet Take 1 mg by mouth 3 (three) times daily as needed for anxiety.   amLODipine  (NORVASC ) 5 MG tablet Take 1 tablet (5 mg total) by mouth 2 (two) times daily.   calcium -vitamin D (OSCAL WITH D) 500-5 MG-MCG tablet Take 1 tablet by mouth.   esomeprazole  (NEXIUM ) 40 MG capsule Take 1 capsule (40 mg total) by mouth 2 (two) times daily before a meal for 14 days.   fluticasone  (FLONASE ) 50 MCG/ACT nasal spray Place into both nostrils daily.   gabapentin  (NEURONTIN ) 100 MG capsule Take 1 capsule by mouth 2 (two) times daily. 100 @ noon, 100 @ 1800, 300 at bedtime   gabapentin  (NEURONTIN ) 300 MG capsule Take 1 capsule (300 mg total) by mouth at bedtime for 15 days, THEN 1 capsule (300 mg total) 2 (two) times daily.   hydrocortisone  (CORTEF ) 10 MG tablet Take 10-15 mg by mouth See admin instructions. Take 1.5 tablets (15 mg) by mouth in the morning (1 hour after levothyroxine ) & take 1 tablet (10 mg) by mouth in the afternoon at 2 pm.   hydrocortisone  sodium succinate  (SOLU-CORTEF ) 100 MG injection Inject 2  mLs (100 mg total) into the muscle as needed. For adrenal crisis.   levothyroxine  (SYNTHROID ) 75 MCG tablet Take 75 mcg by mouth daily before breakfast.   liothyronine (CYTOMEL) 5 MCG tablet Take 5 mcg by mouth 2 (two) times daily. (Patient taking differently: Take 5 mcg by mouth 2 (two) times daily. Takes 1 or 2 daily)   rosuvastatin  (CRESTOR ) 20 MG tablet Take 20 mg by mouth daily.   sertraline (ZOLOFT) 100 MG tablet Take 150 mg by mouth daily.   testosterone  cypionate (DEPOTESTOSTERONE CYPIONATE) 200 MG/ML injection Inject into  the muscle every Monday. 0.37mls   traMADol  (ULTRAM ) 50 MG tablet Take 1 tablet (50 mg total) by mouth every 12 (twelve) hours as needed for severe pain (pain score 7-10).   levothyroxine  (SYNTHROID ) 88 MCG tablet Take 88 mcg by mouth daily before breakfast. (Patient not taking: Reported on 11/07/2023)   No facility-administered encounter medications on file as of 11/07/2023.    Allergies (verified) Patient has no known allergies.   History: Past Medical History:  Diagnosis Date   Adrenal insufficiency (Addison's disease) (HCC)    Allergy    Anxiety    Basal cell carcinoma    nasal   COVID-19    1/14, 05/24/20   Esophageal cancer (HCC) 10/21/2016   GE junction   GERD (gastroesophageal reflux disease)    Hyperglycemia    Hyperlipidemia    Hypertension    Left acoustic neuroma (HCC)    Sleep apnea    CPAP   SVT (supraventricular tachycardia) (HCC)    2013   Thyroid  disease    Immunotherapy induced around 2019   Tobacco abuse    Stoped around 2005, smoked for about 15 years, 1/2 pack per day   Past Surgical History:  Procedure Laterality Date   COLONOSCOPY     KNEE ARTHROSCOPY Right 07/30/2021   Procedure: right knee arthroscopy, debridement, removal loose body;  Surgeon: Addie Cordella Hamilton, MD;  Location: [facility] OR;  Service: Orthopedics;  Laterality: Right;   LAPAROSCOPIC CHOLECYSTECTOMY  1991   SHOULDER SURGERY Right 1985, 1991, 2000   left x3   [place] teeth extraction     UPPER GASTROINTESTINAL ENDOSCOPY     Family History  Problem Relation Age of Onset   Hypertension Mother    Hypertension Father    Pancreatic cancer Father    Clotting disorder Father        renal cell ca   Colon cancer Neg Hx    Esophageal cancer Neg Hx    Prostate cancer Neg Hx    Rectal cancer Neg Hx    Stomach cancer Neg Hx    Colon polyps Neg Hx    Social History   Socioeconomic History   Marital status: Married    Spouse name: Not on file   Number of children: Not on file   Years of  education: Not on file   Highest education level: Bachelor's degree (e.g., BA, AB, BS)  Occupational History   Not on file  Tobacco Use   Smoking status: Former    Current packs/day: 0.50    Average packs/day: 0.5 packs/day for 25.0 years (12.5 ttl pk-yrs)    Types: Cigarettes   Smokeless tobacco: Never   Tobacco comments:    trying gum to quit  Vaping Use   Vaping status: Former  Substance and Sexual Activity   Alcohol use: Not Currently    Comment: occasionally   Drug use: No   Sexual activity: Not on file  Other Topics Concern   Not on file  Social History Narrative   Not on file   Social Drivers of Health   Financial Resource Strain: Low Risk  (11/07/2023)   Overall Financial Resource Strain (CARDIA)    Difficulty of Paying Living Expenses: Not hard at all  Food Insecurity: No Food Insecurity (11/07/2023)   Hunger Vital Sign    Worried About Running Out of Food in the Last Year: Never true    Ran Out of Food in the Last Year: Never true  Transportation Needs: No Transportation Needs (11/07/2023)   PRAPARE - Administrator, Civil Service (Medical): No    Lack of Transportation (Non-Medical): No  Physical Activity: Insufficiently Active (11/07/2023)   Exercise Vital Sign    Days of Exercise per Week: 4 days    Minutes of Exercise per Session: 20 min  Stress: Stress Concern Present (11/07/2023)   [employer] of Occupational Health - Occupational Stress Questionnaire    Feeling of Stress: Rather much  Social Connections: Moderately Isolated (11/07/2023)   Social Connection and Isolation Panel    Frequency of Communication with Friends and Family: More than three times a week    Frequency of Social Gatherings with Friends and Family: More than three times a week    Attends Religious Services: Never    Database administrator or Organizations: No    Attends Engineer, structural: Never    Marital Status: Married    Tobacco Counseling Counseling  given: Not Answered Tobacco comments: trying gum to quit    Clinical Intake:  Pre-visit preparation completed: Yes  Pain : 0-10 Pain Score: 6  Pain Type: Chronic pain Pain Location: Foot Pain Orientation: Left Pain Descriptors / Indicators: Numbness (dead feeling) Pain Onset: More than a month ago Pain Frequency: Constant     Nutritional Status: BMI > 30  Obese Nutritional Risks: None Diabetes: No  Lab Results  Component Value Date   HGBA1C 5.7 (A) 05/30/2023   HGBA1C 6.0 11/23/2022   HGBA1C 5.8 11/05/2011     How often do you need to have someone help you when you read instructions, pamphlets, or other written materials from your doctor or pharmacy?: 1 - Never  Interpreter Needed?: No  Information entered by :: R. Lashun Ramseyer LPN   Activities of Daily Living     11/07/2023   10:15 AM  In your present state of health, do you have any difficulty performing the following activities:  Hearing? 0  Vision? 0  Difficulty concentrating or making decisions? 0  Walking or climbing stairs? 1  Dressing or bathing? 0  Doing errands, shopping? 0  Preparing Food and eating ? N  Using the Toilet? N  In the past six months, have you accidently leaked urine? Y  Do you have problems with loss of bowel control? N  Managing your Medications? Y  Managing your Finances? N  Housekeeping or managing your Housekeeping? Y    Patient Care Team: Bair, Kalpana, MD as PCP - General (Family Medicine) Perla Evalene PARAS, MD as PCP - Cardiology (Cardiology) Perla Evalene PARAS, MD as Consulting Physician (Cardiology) Lennie, Darice Reusing, MD as Referring Physician (Urology) Sheilda Lorrayne BRAVO, MD as Referring Physician (Internal Medicine) Lucienne, Ector Lock, MD as Referring Physician (Endocrinology) Levern, Richerd Helling, NP as Nurse Practitioner (Psychiatry) Kaylie, David, MD (Otolaryngology)  I have updated your Care Teams any recent Medical Services you may have received from other  providers in the past year.  Assessment:   This is a routine wellness examination for Muhammadali.  Hearing/Vision screen Hearing Screening - Comments:: No issues Vision Screening - Comments:: readers   Goals Addressed             This Visit's Progress    Patient Stated       Would like to be able to be more active       Depression Screen     11/07/2023   10:29 AM 10/13/2023    1:12 PM 09/09/2023   11:15 AM 05/30/2023    9:07 AM 04/25/2023   10:34 AM 01/28/2023    8:20 AM 12/28/2022   11:38 AM  PHQ 2/9 Scores  PHQ - 2 Score 1 3 3 4 3  0 1  PHQ- 9 Score 5  11 13 12  0   Exception Documentation     Patient refusal      Fall Risk     11/07/2023   10:20 AM 10/13/2023    1:11 PM 09/09/2023   11:15 AM 05/30/2023    9:07 AM 04/25/2023   10:34 AM  Fall Risk   Falls in the past year? 1 0 0 0 0  Number falls in past yr: 0  0 0 0  Injury with Fall? 1  0 0 0  Comment right shoulder injury      Risk for fall due to : History of fall(s);Impaired balance/gait;Impaired mobility  No Fall Risks No Fall Risks No Fall Risks  Follow up Falls evaluation completed;Falls prevention discussed Falls evaluation completed Falls evaluation completed Falls evaluation completed Falls evaluation completed    MEDICARE RISK AT HOME:  Medicare Risk at Home Any stairs in or around the home?: Yes If so, are there any without handrails?: No Home free of loose throw rugs in walkways, pet beds, electrical cords, etc?: Yes Adequate lighting in your home to reduce risk of falls?: Yes Life alert?: No Use of a cane, walker or w/c?: No Grab bars in the bathroom?: Yes Shower chair or bench in shower?: Yes Elevated toilet seat or a handicapped toilet?: No  TIMED UP AND GO:  Was the test performed?  No  Cognitive Function: 6CIT completed        11/07/2023   10:35 AM 11/02/2022   11:01 AM  6CIT Screen  What Year? 0 points 0 points  What month? 0 points 0 points  What time? 0 points 0 points  Count back  from 20 0 points 0 points  Months in reverse 0 points 0 points  Repeat phrase 0 points 0 points  Total Score 0 points 0 points    Immunizations Immunization History  Administered Date(s) Administered   Influenza, Seasonal, Injecte, Preservative Fre 04/25/2023   Influenza,inj,Quad PF,6+ Mos 02/03/2017, 02/13/2018, 01/11/2019   Influenza-Unspecified 04/09/2020   PFIZER(Purple Top)SARS-COV-2 Vaccination 07/30/2019, 08/06/2019, 08/27/2019   Tdap 03/16/2021   Tetanus 03/16/2021    Screening Tests Health Maintenance  Topic Date Due   Hepatitis C Screening  Never done   Zoster Vaccines- Shingrix (1 of 2) Never done   COVID-19 Vaccine (4 - 2024-25 season) 01/09/2023   Medicare Annual Wellness (AWV)  11/02/2023   INFLUENZA VACCINE  12/09/2023   Colonoscopy  10/19/2026   DTaP/Tdap/Td (2 - Td or Tdap) 03/17/2031   HIV Screening  Completed   Hepatitis B Vaccines  Aged Out   HPV VACCINES  Aged Out   Meningococcal B Vaccine  Aged Out    Health Maintenance  Health Maintenance Due  Topic Date Due   Hepatitis C Screening  Never done   Zoster Vaccines- Shingrix (1 of 2) Never done   COVID-19 Vaccine (4 - 2024-25 season) 01/09/2023   Medicare Annual Wellness (AWV)  11/02/2023   Health Maintenance Items Addressed: Patient declines covid and shingles vaccines.  Patient declines Hepatitis C screening at this time because he thinks that he may have already had this done. Patient will bring results to next visit if he can locate it  Additional Screening:  Vision Screening: Recommended annual ophthalmology exams for early detection of glaucoma and other disorders of the eye.Overdue. Patient stated that he will call and schedule an eye appointment and will probably go to someone in [place] Would you like a referral to an eye doctor? No    Dental Screening: Recommended annual dental exams for proper oral hygiene  Community Resource Referral / Chronic Care Management: CRR required this  visit?  No   CCM required this visit?  No   Plan:    I have personally reviewed and noted the following in the patient's chart:   Medical and social history Use of alcohol, tobacco or illicit drugs  Current medications and supplements including opioid prescriptions. Patient is currently taking opioid prescriptions. Information provided to patient regarding non-opioid alternatives. Patient advised to discuss non-opioid treatment plan with their provider. Functional ability and status Nutritional status Physical activity Advanced directives List of other physicians Hospitalizations, surgeries, and ER visits in previous 12 months Vitals Screenings to include cognitive, depression, and falls Referrals and appointments  In addition, I have reviewed and discussed with patient certain preventive protocols, quality metrics, and best practice recommendations. A written personalized care plan for preventive services as well as general preventive health recommendations were provided to patient.   Angeline Fredericks, LPN   [id number]   After Visit Summary: (MyChart) Due to this being a telephonic visit, the after visit summary with patients personalized plan was offered to patient via MyChart   Notes: Nothing significant to report at this time.

## 2023-11-07 NOTE — Patient Instructions (Signed)
 [name] , Thank you for taking time out of your busy schedule to complete your Annual Wellness Visit with me. I enjoyed our conversation and look forward to speaking with you again next year. I, as well as your care team,  appreciate your ongoing commitment to your health goals. Please review the following plan we discussed and let me know if I can assist you in the future. Your Game plan/ To Do List    Referrals: If you haven't heard from the office you've been referred to, please reach out to them at the phone provided.  Remember to call and schedule an eye appointment.  Consider updating your shingles and covid vaccines. Follow up Visits: Next Medicare AWV with our clinical staff: 11/14/24 @ 10:10   Have you seen your provider in the last 6 months (3 months if uncontrolled diabetes)? Yes Next Office Visit with your provider: 12/15/23  Clinician Recommendations:  Aim for 30 minutes of exercise or brisk walking, 6-8 glasses of water, and 5 servings of fruits and vegetables each day.       This is a list of the screening recommended for you and due dates:  Health Maintenance  Topic Date Due   Hepatitis C Screening  Never done   Zoster (Shingles) Vaccine (1 of 2) Never done   COVID-19 Vaccine (4 - 2024-25 season) 01/09/2023   Flu Shot  12/09/2023   Medicare Annual Wellness Visit  11/06/2024   Colon Cancer Screening  10/19/2026   DTaP/Tdap/Td vaccine (2 - Td or Tdap) 03/17/2031   HIV Screening  Completed   Hepatitis B Vaccine  Aged Out   HPV Vaccine  Aged Out   Meningitis B Vaccine  Aged Out    Advanced directives: (ACP Link)Information on Advanced Care Planning can be found at [employer] Advance Health Care Directives Advance Health Care Directives. [email or web address]/  Advance Care Planning is important because it:  [x]  Makes sure you receive the medical care that is consistent with your values, goals, and preferences  [x]  It provides guidance to your family and loved  ones and reduces their decisional burden about whether or not they are making the right decisions based on your wishes.  Follow the link provided in your after visit summary or read over the paperwork we have mailed to you to help you started getting your Advance Directives in place. If you need assistance in completing these, please reach out to us  so that we can help you! Managing Pain Without Opioids Opioids are strong medicines used to treat moderate to severe pain. For some people, especially those who have long-term (chronic) pain, opioids may not be the best choice for pain management due to: Side effects like nausea, constipation, and sleepiness. The risk of addiction (opioid use disorder). The longer you take opioids, the greater your risk of addiction. Pain that lasts for more than 3 months is called chronic pain. Managing chronic pain usually requires more than one approach and is often provided by a team of health care providers working together (multidisciplinary approach). Pain management may be done at a pain management center or pain clinic. How to manage pain without the use of opioids Use non-opioid medicines Non-opioid medicines for pain may include: Over-the-counter or prescription non-steroidal anti-inflammatory drugs (NSAIDs). These may be the first medicines used for pain. They work well for muscle and bone pain, and they reduce swelling. Acetaminophen . This over-the-counter medicine may work well for milder pain but not swelling. Antidepressants. These  may be used to treat chronic pain. A certain type of antidepressant (tricyclics) is often used. These medicines are given in lower doses for pain than when used for depression. Anticonvulsants. These are usually used to treat seizures but may also reduce nerve (neuropathic) pain. Muscle relaxants. These relieve pain caused by sudden muscle tightening (spasms). You may also use a pain medicine that is applied to the skin as a  patch, cream, or gel (topical analgesic), such as a numbing medicine. These may cause fewer side effects than medicines taken by mouth. Do certain therapies as directed Some therapies can help with pain management. They include: Physical therapy. You will do exercises to gain strength and flexibility. A physical therapist may teach you exercises to move and stretch parts of your body that are weak, stiff, or painful. You can learn these exercises at physical therapy visits and practice them at home. Physical therapy may also involve: Massage. Heat wraps or applying heat or cold to affected areas. Electrical signals that interrupt pain signals (transcutaneous electrical nerve stimulation, TENS). Weak lasers that reduce pain and swelling (low-level laser therapy). Signals from your body that help you learn to regulate pain (biofeedback). Occupational therapy. This helps you to learn ways to function at home and work with less pain. Recreational therapy. This involves trying new activities or hobbies, such as a physical activity or drawing. Mental health therapy, including: Cognitive behavioral therapy (CBT). This helps you learn coping skills for dealing with pain. Acceptance and commitment therapy (ACT) to change the way you think and react to pain. Relaxation therapies, including muscle relaxation exercises and mindfulness-based stress reduction. Pain management counseling. This may be individual, family, or group counseling.  Receive medical treatments Medical treatments for pain management include: Nerve block injections. These may include a pain blocker and anti-inflammatory medicines. You may have injections: Near the spine to relieve chronic back or neck pain. Into joints to relieve back or joint pain. Into nerve areas that supply a painful area to relieve body pain. Into muscles (trigger point injections) to relieve some painful muscle conditions. A medical device placed near your spine  to help block pain signals and relieve nerve pain or chronic back pain (spinal cord stimulation device). Acupuncture. Follow these instructions at home Medicines Take over-the-counter and prescription medicines only as told by your health care provider. If you are taking pain medicine, ask your health care providers about possible side effects to watch out for. Do not drive or use heavy machinery while taking prescription opioid pain medicine. Lifestyle  Do not use drugs or alcohol to reduce pain. If you drink alcohol, limit how much you have to: 0-1 drink a day for women who are not pregnant. 0-2 drinks a day for men. Know how much alcohol is in a drink. In the [place] one drink equals one 12 oz bottle of beer (355 mL), one 5 oz glass of wine (148 mL), or one 1 oz glass of hard liquor (44 mL). Do not use any products that contain nicotine or tobacco. These products include cigarettes, chewing tobacco, and vaping devices, such as e-cigarettes. If you need help quitting, ask your health care provider. Eat a healthy diet and maintain a healthy weight. Poor diet and excess weight may make pain worse. Eat foods that are high in fiber. These include fresh fruits and vegetables, whole grains, and beans. Limit foods that are high in fat and processed sugars, such as fried and sweet foods. Exercise regularly. Exercise lowers stress and  may help relieve pain. Ask your health care provider what activities and exercises are safe for you. If your health care provider approves, join an exercise class that combines movement and stress reduction. Examples include yoga and tai chi. Get enough sleep. Lack of sleep may make pain worse. Lower stress as much as possible. Practice stress reduction techniques as told by your therapist. General instructions Work with all your pain management providers to find the treatments that work best for you. You are an important member of your pain management team. There are  many things you can do to reduce pain on your own. Consider joining an online or in-person support group for people who have chronic pain. Keep all follow-up visits. This is important. Where to find more information You can find more information about managing pain without opioids from: American Academy of Pain Medicine: painmed.org Institute for Chronic Pain: instituteforchronicpain.org American Chronic Pain Association: theacpa.org Contact a health care provider if: You have side effects from pain medicine. Your pain gets worse or does not get better with treatments or home therapy. You are struggling with anxiety or depression. Summary Many types of pain can be managed without opioids. Chronic pain may respond better to pain management without opioids. Pain is best managed when you and a team of health care providers work together. Pain management without opioids may include non-opioid medicines, medical treatments, physical therapy, mental health therapy, and lifestyle changes. Tell your health care providers if your pain gets worse or is not being managed well enough. This information is not intended to replace advice given to you by your health care provider. Make sure you discuss any questions you have with your health care provider. Document Revised: 08/06/2020 Document Reviewed: 08/06/2020  Elsevier Patient Education  2024 [employer].

## 2023-11-07 NOTE — Progress Notes (Signed)
 PROVIDER NOTE: Interpretation of information contained herein should be left to medically-trained personnel. Specific patient instructions are provided elsewhere under Patient Instructions section of medical record. This document was created in part using AI and STT-dictation technology, any transcriptional errors that may result from this process are unintentional.  Patient: Jose Hayes  Service: E/M   PCP: Abbey Bruckner, MD  DOB: 1960-10-27  DOS: 11/07/2023  Provider: Wallie Sherry, MD  MRN: [id number]  Delivery: Face-to-face  Specialty: Interventional Pain Management  Type: Established Patient  Setting: Ambulatory outpatient facility  Specialty designation: 09  Referring Prov.: Bair, Bruckner, MD  Location: Outpatient office facility       History of present illness (HPI) Jose Hayes, a 63 y.o. year old male, is here today because of his Lumbar radiculopathy [M54.16]. Jose Hayes primary complain today is Back Pain (Discuss MRI Results)   Pain Assessment: Severity of Chronic pain is reported as a 4 /10. Location: Back Left/Radiates into Left leg and foot. Onset: More than a month ago. Quality: Aching, Numbness, Constant. Timing: Constant. Modifying factor(s): Gabapentin . Vitals:  height is 5' 8 (1.727 m) and weight is 240 lb (108.9 kg). His temperature is 98.4 F (36.9 C). His blood pressure is 139/90 (abnormal) and his pulse is 63. His respiration is 18 and oxygen saturation is 96%.  BMI: Estimated body mass index is 36.49 kg/m as calculated from the following:   Height as of this encounter: 5' 8 (1.727 m).   Weight as of this encounter: 240 lb (108.9 kg).  Last encounter: 10/13/2023. Last procedure: Visit date not found.  Reason for encounter:  2nd pt visit to review lumbar MRI results  History of Present Illness   Jose Hayes is a 63 year old male with a history of spinal degeneration and spondylosis who presents with left leg pain.  He experiences  persistent left leg pain, more severe than the right, with a 'dead feeling' in his foot. He suspects chronic nerve damage from chemotherapy completed four years ago. He is currently taking tramadol  for pain management and confirms an adequate supply. He is not on any blood thinners.  He recalls mixed experiences with epidural steroid injections, noting that two out of three times the procedure was not painful, but one instance was particularly uncomfortable.  He reports a fall six to seven months ago, which may have worsened neck and shoulder pain. This pain has been progressively worsening over the last five months. He mentions a previous diagnosis of a mild AC joint sprain.     clinic visit: 10/13/23 Jose Hayes is a 63 year old male with neuropathy and spondylolisthesis who presents with worsening foot pain.   He has a history of neuropathy following cancer treatment, with persistent symptoms in his hands and right foot, and more severe symptoms in his left foot. He associates the left foot pain with previous episodes of sciatica. Recently, he experienced a significant increase in pain after twisting his torso while shopping, describing the sensation as if his foot 'disconnected' and rating the pain as ten out of ten. Despite the severity, there was no swelling observed.   He has a history of spondylolisthesis at L5-S1 and a past disc herniation approximately 25 years ago. He experiences tingling on the outside of his calf, which he is unsure if it is due to neuropathy from cancer treatment or residual effects from his back issues. He notes that his back pain has decreased, but the foot pain persists,  causing him to walk on his heel.   He started taking a higher dose of gabapentin  three weeks ago, reaching 600 mg daily, divided into 100 mg at noon, 100 mg at dinner, and 300 mg at bedtime. Initially, he did not notice improvement, but recently he has experienced some relief. He also takes alpha  lipoic acid and has increased his hydrocortisone  dose due to fatigue exacerbated by pain.   He describes the foot as feeling 'dead' since the incident in the grocery store, with occasional stabbing pains. This sensation affects his sleep and daily activities. He is unable to take NSAIDs and uses Tylenol  as needed. He is not currently using alprazolam , which allows for higher gabapentin  dosing without concern for serotonin syndrome.      Of note, the patient saw me as a new patient on 12/28/2022 but was lost to follow-up due to other medical conditions that he was dealing with at the time.  During that encounter, a lumbar MRI was ordered however the patient was unable to complete it due to other things going on at the time       HPI from initial clinic visit: 12/28/2022 Jose Hayes is a pleasant 63 year old male who presents with a chief complaint of axial low back pain as well as left groin pain.  He states that his low back pain is chronic in nature and has been going on for many years.  He has a history of lumbar degenerative disc disease and lumbar spondylolisthesis.  Also pertinent to his medical history is a history of bilateral inguinal hernia surgery now with persistent and chronic left groin pain consistent with ilioinguinal/genitofemoral neuralgia.  He has had a full urologic workup which did not reveal any urologic issues for his left groin pain.  Of note he has a history of stomach cancer stage IV in 2018 and he received immunotherapy and chemotherapy.  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  He is on chronic steroids.  He also has a history of anxiety and PTSD.  He has seen psychiatry in the past and has tried various SSRIs and SNRIs.  He takes alprazolam  as needed for anxiety.  He also has a history of hypertension, hyperlipidemia, obstructive sleep apnea on CPAP as  well as obesity.  He has not had any recent MRIs done.  Pharmacotherapy Assessment   Analgesic: Tramadol  50 mg twice daily Monitoring: [place] PMP: PDMP reviewed during this encounter.       Pharmacotherapy: No side-effects or adverse reactions reported. Compliance: No problems identified. Effectiveness: Clinically acceptable.  No notes on file  UDS:  Summary  Date Value Ref Range Status  10/13/2023 FINAL  Final    Comment:    ==================================================================== Compliance Drug Analysis, Ur ==================================================================== Test                             Result       Flag       Units  Drug Present and Declared for Prescription Verification   Gabapentin                      PRESENT      EXPECTED   Sertraline                     PRESENT  EXPECTED   Desmethylsertraline            PRESENT      EXPECTED    Desmethylsertraline is an expected metabolite of sertraline.  Drug Absent but Declared for Prescription Verification   Alprazolam                      Not Detected UNEXPECTED ng/mg creat   Tramadol                        Not Detected UNEXPECTED ng/mg creat ==================================================================== Test                      Result    Flag   Units      Ref Range   Creatinine              146              mg/dL      >=79 ==================================================================== Declared Medications:  The flagging and interpretation on this report are based on the  following declared medications.  Unexpected results may arise from  inaccuracies in the declared medications.   **Note: The testing scope of this panel includes these medications:   Alprazolam  (Xanax )  Gabapentin  (Neurontin )  Sertraline (Zoloft)  Tramadol  (Ultram )   **Note: The testing scope of this panel does not include the  following reported medications:   Albuterol  (Ventolin  HFA)  Amlodipine  (Norvasc )   Calcium   Cholecalciferol  Esomeprazole  (Nexium )  Hydrocortisone  (Cortef )  Levothyroxine  (Synthroid )  Liothyronine (Cytomel)  Rosuvastatin  (Crestor )  Testosterone  ==================================================================== For clinical consultation, please call [phone]. ====================================================================     No results found for: CBDTHCR No results found for: D8THCCBX No results found for: D9THCCBX  ROS  Constitutional: Denies any fever or chills Gastrointestinal: No reported hemesis, hematochezia, vomiting, or acute GI distress Musculoskeletal: left leg pain Neurological: No reported episodes of acute onset apraxia, aphasia, dysarthria, agnosia, amnesia, paralysis, loss of coordination, or loss of consciousness  Medication Review  ALPRAZolam , albuterol , amLODipine , calcium -vitamin D, esomeprazole , fluticasone , gabapentin , hydrocortisone , hydrocortisone  sodium succinate , levothyroxine , liothyronine, rosuvastatin , sertraline, testosterone  cypionate, and traMADol   History Review  Allergy: Jose Hayes has no known allergies. Drug: Jose Hayes  reports no history of drug use. Alcohol:  reports that he does not currently use alcohol. Tobacco:  reports that he has quit smoking. His smoking use included cigarettes. He has a 12.5 pack-year smoking history. He has never used smokeless tobacco. Social: Jose Hayes  reports that he has quit smoking. His smoking use included cigarettes. He has a 12.5 pack-year smoking history. He has never used smokeless tobacco. He reports that he does not currently use alcohol. He reports that he does not use drugs. Medical:  has a past medical history of Adrenal insufficiency (Addison's disease) (HCC), Allergy, Anxiety, Basal cell carcinoma, COVID-19, Esophageal cancer (HCC) (10/21/2016), GERD (gastroesophageal reflux disease), Hyperglycemia, Hyperlipidemia, Hypertension, Left acoustic neuroma (HCC),  Sleep apnea, SVT (supraventricular tachycardia) (HCC), Thyroid  disease, and Tobacco abuse. Surgical: Jose Hayes  has a past surgical history that includes Shoulder surgery (Right, 1985, 1991, 2000); Laparoscopic cholecystectomy (1991); Colonoscopy; Upper gastrointestinal endoscopy; swidom teeth extraction; and Knee arthroscopy (Right, 07/30/2021). Family: family history includes Clotting disorder in his father; Hypertension in his father and mother; Pancreatic cancer in his father.  Laboratory Chemistry Profile   Renal Lab Results  Component Value Date   BUN 21 09/13/2023   CREATININE 1.04 09/13/2023   BCR NOT APPLICABLE 12/19/2020  GFR 76.81 09/13/2023   GFRAA >60 11/06/2011   GFRNONAA >60 05/04/2022    Hepatic Lab Results  Component Value Date   AST 19 09/13/2023   ALT 21 09/13/2023   ALBUMIN 4.4 09/13/2023   ALKPHOS 48 09/13/2023   LIPASE 15.0 10/13/2016    Electrolytes Lab Results  Component Value Date   NA 139 09/13/2023   K 4.3 09/13/2023   CL 102 09/13/2023   CALCIUM  8.8 09/13/2023   MG 2.8 (H) 05/04/2022    Bone No results found for: VD25OH, [id number], CI6874NY7, CI7874NY7, 25OHVITD1, 25OHVITD2, 25OHVITD3, TESTOFREE, TESTOSTERONE   Inflammation (CRP: Acute Phase) (ESR: Chronic Phase) Lab Results  Component Value Date   ESRSEDRATE 13 05/03/2022   LATICACIDVEN 1.5 05/03/2022         Note: Above Lab results reviewed.  Recent Imaging Review  MR LUMBAR SPINE WO CONTRAST EXAM DESCRIPTION: MR LUMBAR SPINE WO CONTRAST  CLINICAL HISTORY: Low back pain, symptoms persist with > 6 wks treatment; Lumbar radiculopathy, symptoms persist with > 6 wks treatment; left leg tingling/pain  COMPARISON: None Available.  TECHNIQUE: MRI of the lumbar spine is performed according to our usual protocol with axial and sagittal multi sequence imaging.  FINDINGS: Mild anterolisthesis of L5 on S1 related to chronic L5 pars defects the alignment is otherwise  unremarkable. Mild degenerative disc disease throughout. No endplate marrow edema. The marrow signal is unremarkable as well as the conus and cauda equina.  L3-4 has mild bilateral foraminal narrowing due to broad-based disc bulge and facet hypertrophy.  L4-5 has mild foraminal narrowing on the right due to broad-based disc bulge and facet hypertrophy.  L5-S1 has mild foraminal narrowing on the right due to broad-based disc bulge and facet hypertrophy. There is a small left foraminal disc osteophyte complex combined with facet hypertrophy to cause moderate foraminal narrowing the left with possible contact of the exiting L5 nerve.  The image levels are otherwise unremarkable.  The imaged retroperitoneal structures are unremarkable.  IMPRESSION: Mild anterolisthesis of L5 on S1 related to chronic L5 pars defects.  Multilevel degenerative spondylosis with levels described in detail above.  L5-S1 has a small left foraminal disc osteophyte complex contributing to moderate foraminal narrowing with suspected contact of the exiting L5 nerve.  Electronically signed by: Reyes Frees MD 11/02/2023 07:19 AM EDT RP Workstation: [id number] Note: Reviewed        Physical Exam  Vitals: BP (!) 139/90   Pulse 63   Temp 98.4 F (36.9 C)   Resp 18   Ht 5' 8 (1.727 m)   Wt 240 lb (108.9 kg)   SpO2 96%   BMI 36.49 kg/m  BMI: Estimated body mass index is 36.49 kg/m as calculated from the following:   Height as of this encounter: 5' 8 (1.727 m).   Weight as of this encounter: 240 lb (108.9 kg). Ideal: Ideal body weight: 68.4 kg (150 lb 12.7 oz) Adjusted ideal body weight: 84.6 kg (186 lb 7.6 oz) General appearance: Well nourished, well developed, and well hydrated. In no apparent acute distress Mental status: Alert, oriented x 3 (person, place, & time)       Respiratory: No evidence of acute respiratory distress Eyes: PERLA  Lumbar Spine Area Exam  Skin & Axial Inspection: No  masses, redness, or swelling Alignment: Symmetrical Functional ROM: Pain restricted ROM affecting both sides Stability: No instability detected Muscle Tone/Strength: Functionally intact. No obvious neuro-muscular anomalies detected. Sensory (Neurological): Dermatomal pain pattern, neurogenic, MSK Palpation: No palpable anomalies  Ambulation: Unassisted Gait: Relatively normal for age and body habitus Posture: WNL  Lower Extremity Exam      Side: Right lower extremity   Side: Left lower extremity  Stability: No instability observed           Stability: No instability observed          Skin & Extremity Inspection: Skin color, temperature, and hair growth are WNL. No peripheral edema or cyanosis. No masses, redness, swelling, asymmetry, or associated skin lesions. No contractures.   Skin & Extremity Inspection: Skin color, temperature, and hair growth are WNL. No peripheral edema or cyanosis. No masses, redness, swelling, asymmetry, or associated skin lesions. No contractures.  Functional ROM: Unrestricted ROM                   Functional ROM: Unrestricted ROM                  Muscle Tone/Strength: Functionally intact. No obvious neuro-muscular anomalies detected.   Muscle Tone/Strength: Functionally intact. No obvious neuro-muscular anomalies detected.  Sensory (Neurological): Unimpaired         Sensory (Neurological): left groin pain        DTR: Patellar: deferred today Achilles: deferred today Plantar: deferred today   DTR: Patellar: deferred today Achilles: deferred today Plantar: deferred today  Palpation: No palpable anomalies   Palpation: No palpable anomalies    Assessment   Diagnosis Status  1. Lumbar radiculopathy   2. Chronic radicular lumbar pain    Having a Flare-up Having a Flare-up    Updated Problems: No problems updated.  Plan of Care  Problem-specific:  Assessment and Plan    Left L5 nerve root compression   Left L5 nerve root compression with  associated leg pain is confirmed by MRI, showing mild degeneration and spondylosis with a disc bulge and disc osteophyte complex impacting the left L5 nerve root, resulting in moderate left foraminal stenosis.Schedule a left L5 transfemoral epidural steroid injection next week. Administer 10 mg oral Valium prior to the procedure for anxiety. Refer to neurology for a nerve conduction velocity study if the injection does not alleviate symptoms.  Chronic pain due to chemotherapy   Chronic pain is potentially related to nerve damage from chemotherapy, which ended four years ago. Suspected chronic nerve damage may be contributing to symptoms. Refer to neurology for a nerve conduction velocity study if needed.  Obesity   Obesity is contributing to back pain and complicating management. Discussed potential benefits of GLP-1 peptide therapy for weight management, noting previous insurance issues with coverage. Research GLP-1 peptide therapy options and discuss this therapy at the post-procedure follow-up.   Chronic pain syndrome: Continue gabapentin  and tramadol  as prescribed.      Jose Hayes has a current medication list which includes the following long-term medication(s): albuterol , amlodipine , calcium -vitamin d, esomeprazole , gabapentin , gabapentin , levothyroxine , levothyroxine , liothyronine, sertraline, and testosterone  cypionate.  Pharmacotherapy (Medications Ordered): No orders of the defined types were placed in this encounter.  Orders:  Orders Placed This Encounter  Procedures   Lumbar Transforaminal Epidural    Standing Status:   Future    Expected Date:   11/14/2023    Expiration Date:   11/06/2024    Scheduling Instructions:     Laterality: LEFT L5         Sedation: PO Valium 10 mg     Timeframe: next week    Where will this procedure be performed?:   [facility]  Return in about 9 days (around 11/16/2023) for Left L5 TF ESI, in clinic (PO Valium 10mg ).     Recent Visits Date Type Provider Dept  10/13/23 Office Visit Marcelino Nurse, MD [facility]  Showing recent visits within past 90 days and meeting all other requirements Today's Visits Date Type Provider Dept  11/07/23 Office Visit Marcelino Nurse, MD [facility]  Showing today's visits and meeting all other requirements Future Appointments No visits were found meeting these conditions. Showing future appointments within next 90 days and meeting all other requirements  I discussed the assessment and treatment plan with the patient. The patient was provided an opportunity to ask questions and all were answered. The patient agreed with the plan and demonstrated an understanding of the instructions.  Patient advised to call back or seek an in-person evaluation if the symptoms or condition worsens.  Duration of encounter: .  Total time on encounter, as per AMA guidelines included both the face-to-face and non-face-to-face time personally spent by the physician and/or other qualified health care professional(s) on the day of the encounter (includes time in activities that require the physician or other qualified health care professional and does not include time in activities normally performed by clinical staff). Physician's time may include the following activities when performed: Preparing to see the patient (e.g., pre-charting review of records, searching for previously ordered imaging, lab work, and nerve conduction tests) Review of prior analgesic pharmacotherapies. Reviewing PMP Interpreting ordered tests (e.g., lab work, imaging, nerve conduction tests) Performing post-procedure evaluations, including interpretation of diagnostic procedures Obtaining and/or reviewing separately obtained history Performing a medically appropriate examination and/or evaluation Counseling and educating the patient/family/caregiver Ordering medications, tests, or  procedures Referring and communicating with other health care professionals (when not separately reported) Documenting clinical information in the electronic or other health record Independently interpreting results (not separately reported) and communicating results to the patient/ family/caregiver Care coordination (not separately reported)  Note by: Nurse Marcelino, MD (TTS and AI technology used. I apologize for any typographical errors that were not detected and corrected.) Date: 11/07/2023; Time: 3:25 PM

## 2023-11-08 ENCOUNTER — Ambulatory Visit: Admitting: Pulmonary Disease

## 2023-11-08 ENCOUNTER — Encounter: Payer: Self-pay | Admitting: Pulmonary Disease

## 2023-11-08 ENCOUNTER — Ambulatory Visit: Admitting: Student in an Organized Health Care Education/Training Program

## 2023-11-08 VITALS — BP 120/80 | HR 85 | Temp 98.2°F | Ht 68.0 in | Wt 244.0 lb

## 2023-11-08 DIAGNOSIS — R058 Other specified cough: Secondary | ICD-10-CM

## 2023-11-08 DIAGNOSIS — J45998 Other asthma: Secondary | ICD-10-CM

## 2023-11-08 DIAGNOSIS — J324 Chronic pansinusitis: Secondary | ICD-10-CM

## 2023-11-08 DIAGNOSIS — C160 Malignant neoplasm of cardia: Secondary | ICD-10-CM

## 2023-11-08 DIAGNOSIS — G4733 Obstructive sleep apnea (adult) (pediatric): Secondary | ICD-10-CM | POA: Diagnosis not present

## 2023-11-08 LAB — NITRIC OXIDE: Nitric Oxide: 51

## 2023-11-08 MED ORDER — BUDESONIDE-FORMOTEROL FUMARATE 160-4.5 MCG/ACT IN AERO: 2.0000 | INHALATION_SPRAY | Freq: Two times a day (BID) | RESPIRATORY_TRACT | 11 refills | Status: AC

## 2023-11-08 MED ORDER — ALBUTEROL SULFATE HFA 108 (90 BASE) MCG/ACT IN AERS: 2.0000 | INHALATION_SPRAY | Freq: Four times a day (QID) | RESPIRATORY_TRACT | 2 refills | Status: AC | PRN

## 2023-11-08 NOTE — Progress Notes (Signed)
 Subjective:    Patient ID: Jose Hayes, male    DOB: [birth year], 63 y.o.   MRN: [id number]  Patient Care Team: Bair, Kalpana, MD as PCP - General (Family Medicine) Perla Evalene PARAS, MD as PCP - Cardiology (Cardiology) Perla Evalene PARAS, MD as Consulting Physician (Cardiology) Lennie, Darice Reusing, MD as Referring Physician (Urology) Sheilda Lorrayne BRAVO, MD as Referring Physician (Internal Medicine) Lucienne, Ector Lock, MD as Referring Physician (Endocrinology) Levern, Richerd Helling, NP as Nurse Practitioner (Psychiatry) Beatris Lenis, MD (Otolaryngology) Tamea Dedra CROME, MD as Consulting Physician (Pulmonary Disease)  Chief Complaint  Patient presents with   Consult    Cough with post nasal drainage.     BACKGROUND: Patient is a 63 year old former smoker with a very complex medical history as noted below who presents for evaluation of a persistent cough and recurrent bronchitis.  He is kindly referred by Dr. Luke Shade.   HPI Discussed the use of AI scribe software for clinical note transcription with the patient, who gave verbal consent to proceed.  History of Present Illness   Jose Hayes is a 63 year old male with a history of gastro esophageal junction cancer and adrenal insufficiency who presents with a persistent cough. He was referred by Dr. Hayden for evaluation of his cough.  He has a persistent cough and a history of recurrent respiratory infections. The most recent episode was diagnosed as bronchitis in late April, with oxygen saturation levels around 89-90%. He received albuterol  treatments in the hospital (ED), which improved his oxygen saturation to 93%. Despite this, he continues to experience difficulty recovering from respiratory infections, with a previous episode diagnosed as pneumonia three to four months ago.  His respiratory infections seem to be triggered by chronic sinusitis.  He notes that whenever he gets sinusitis it usually leads to a  bronchitis.  He has a history of stage four gastric esophageal junction cancer, treated in a research study at [facility]. During treatment, he became immunosuppressed and has been managing adrenal insufficiency with varying doses of steroids. He also has a history of thyroid  issues and anxiety following the diagnosis of metastases to the brain and subsequent treatment.  He experiences shortness of breath during physical activity, with home pulse oximetry readings dropping to 91-92% but improving to 97-98% with focused breathing. He uses a spirometry exerciser regularly. He coughs up sputum that has not yet turned green. No recent fevers or chills. No swelling in the legs.  He has a history of acid reflux, which he believes contributed to his gastro esophageal junction cancer. He describes episodes of waking up gasping for breath due to reflux, which has been managed as GERD-induced asthma in the past. He has a history of smoking from age 19 to 58 but has since quit. He also uses a CPAP for sleep apnea for the past six years, which has improved his sleep quality.  He has neuropathy in his left foot from chemotherapy and has been diagnosed with REM sleep behavior disorder, which causes him to act out his dreams. He has adjusted his sleeping arrangements to manage this condition.   He does not have a past occupational exposure of significance.  He is currently retired due to disability.     Review of Systems A 10 point review of systems was performed and it is as noted above otherwise negative.   Past Medical History:  Diagnosis Date   Adrenal insufficiency (Addison's disease) (HCC)    Allergy    Anxiety  Basal cell carcinoma    nasal   COVID-19    1/14, 05/24/20   Esophageal cancer (HCC) 10/21/2016   GE junction   GERD (gastroesophageal reflux disease)    Hyperglycemia    Hyperlipidemia    Hypertension    Left acoustic neuroma (HCC)    Sleep apnea    CPAP   SVT (supraventricular  tachycardia) (HCC)    2013   Thyroid  disease    Immunotherapy induced around 2019   Tobacco abuse    Stoped around 2005, smoked for about 15 years, 1/2 pack per day    Past Surgical History:  Procedure Laterality Date   COLONOSCOPY     KNEE ARTHROSCOPY Right 07/30/2021   Procedure: right knee arthroscopy, debridement, removal loose body;  Surgeon: Addie Cordella Hamilton, MD;  Location: [facility] OR;  Service: Orthopedics;  Laterality: Right;   LAPAROSCOPIC CHOLECYSTECTOMY  1991   SHOULDER SURGERY Right 1985, 1991, 2000   left x3   [place] teeth extraction     UPPER GASTROINTESTINAL ENDOSCOPY      Patient Active Problem List   Diagnosis Date Noted   History of fainting 09/13/2023   Prediabetes 09/09/2023   Subacute cough 09/09/2023   Bronchitis 05/30/2023   GERD (gastroesophageal reflux disease) 05/30/2023   Fall 05/30/2023   Flank pain 04/25/2023   Skin lesion 04/25/2023   Sinus bradycardia 12/30/2022   Lumbar degenerative disc disease 12/28/2022   Severe left groin pain 12/28/2022   Status post bilateral inguinal hernia repair 12/28/2022   Chronic pain syndrome 12/28/2022   Lumbar facet arthropathy 12/28/2022   Chemotherapy-induced neuropathy (HCC) 12/28/2022   Chronic bilateral low back pain with left-sided sciatica 11/23/2022   Neuropathy 11/23/2022   Poison ivy 05/24/2022   AMS (altered mental status) 05/02/2022   Complex tear of lateral meniscus of right knee as current injury    Loose body in knee, right knee    Right knee pain 07/01/2021   Pulsatile tinnitus 06/22/2021   Left foot pain 06/22/2021   Inguinal hernia 12/19/2020   Adrenal insufficiency (HCC) 05/30/2020   Hypothyroidism 05/30/2020   Anxiety 05/30/2020   Left acoustic neuroma (HCC) 01/01/2020   Deviated nasal septum 08/07/2019   OSA (obstructive sleep apnea) 08/08/2017   Gastroesophageal cancer (HCC) 11/02/2016   Kidney stones 10/13/2016   Restless leg syndrome 09/04/2015   Hyperlipidemia 12/01/2011    SVT (supraventricular tachycardia) (HCC) 12/01/2011   Obesity, morbid (HCC) 12/01/2011   Hypertension 12/01/2011    Family History  Problem Relation Age of Onset   Hypertension Mother    Hypertension Father    Pancreatic cancer Father    Clotting disorder Father        renal cell ca   Colon cancer Neg Hx    Esophageal cancer Neg Hx    Prostate cancer Neg Hx    Rectal cancer Neg Hx    Stomach cancer Neg Hx    Colon polyps Neg Hx     Social History   Tobacco Use   Smoking status: Former    Current packs/day: 0.00    Average packs/day: 1 pack/day for 12.0 years (12.0 ttl pk-yrs)    Types: Cigarettes    Start date: 1999    Quit date: 2011    Years since quitting: 14.5   Smokeless tobacco: Never   Tobacco comments:    trying gum to quit  Substance Use Topics   Alcohol use: Not Currently    Comment: occasionally    No Known Allergies  Current Meds  Medication Sig   albuterol  (VENTOLIN  HFA) 108 (90 Base) MCG/ACT inhaler Inhale 2 puffs into the lungs every 6 (six) hours as needed.   ALPRAZolam  (XANAX ) 1 MG tablet Take 1 mg by mouth 3 (three) times daily as needed for anxiety.   amLODipine  (NORVASC ) 5 MG tablet Take 1 tablet (5 mg total) by mouth 2 (two) times daily.   budesonide -formoterol  (SYMBICORT ) 160-4.5 MCG/ACT inhaler Inhale 2 puffs into the lungs 2 (two) times daily.   calcium -vitamin D (OSCAL WITH D) 500-5 MG-MCG tablet Take 1 tablet by mouth.   esomeprazole  (NEXIUM ) 40 MG capsule Take 1 capsule (40 mg total) by mouth 2 (two) times daily before a meal for 14 days.   fluticasone  (FLONASE ) 50 MCG/ACT nasal spray Place into both nostrils daily.   gabapentin  (NEURONTIN ) 100 MG capsule Take 1 capsule by mouth 2 (two) times daily. 100 @ noon, 100 @ 1800, 300 at bedtime   gabapentin  (NEURONTIN ) 300 MG capsule Take 1 capsule (300 mg total) by mouth at bedtime for 15 days, THEN 1 capsule (300 mg total) 2 (two) times daily.   hydrocortisone  (CORTEF ) 10 MG tablet Take 10-15  mg by mouth See admin instructions. Take 1.5 tablets (15 mg) by mouth in the morning (1 hour after levothyroxine ) & take 1 tablet (10 mg) by mouth in the afternoon at 2 pm.   hydrocortisone  sodium succinate  (SOLU-CORTEF ) 100 MG injection Inject 2 mLs (100 mg total) into the muscle as needed. For adrenal crisis.   levothyroxine  (SYNTHROID ) 75 MCG tablet Take 75 mcg by mouth daily before breakfast.   levothyroxine  (SYNTHROID ) 88 MCG tablet Take 88 mcg by mouth daily before breakfast.   liothyronine (CYTOMEL) 5 MCG tablet Take 5 mcg by mouth 2 (two) times daily. (Patient taking differently: Take 5 mcg by mouth 2 (two) times daily. Takes 1 or 2 daily)   rosuvastatin  (CRESTOR ) 20 MG tablet Take 20 mg by mouth daily.   sertraline (ZOLOFT) 100 MG tablet Take 150 mg by mouth daily.   testosterone  cypionate (DEPOTESTOSTERONE CYPIONATE) 200 MG/ML injection Inject into the muscle every Monday. 0.65mls   traMADol  (ULTRAM ) 50 MG tablet Take 1 tablet (50 mg total) by mouth every 12 (twelve) hours as needed for severe pain (pain score 7-10).   [DISCONTINUED] albuterol  (VENTOLIN  HFA) 108 (90 Base) MCG/ACT inhaler Inhale 2 puffs into the lungs every 4 (four) hours as needed.    Immunization History  Administered Date(s) Administered   Influenza, Seasonal, Injecte, Preservative Fre 04/25/2023   Influenza,inj,Quad PF,6+ Mos 02/03/2017, 02/13/2018, 01/11/2019   Influenza-Unspecified 04/09/2020   PFIZER(Purple Top)SARS-COV-2 Vaccination 07/30/2019, 08/06/2019, 08/27/2019   Tdap 03/16/2021   Tetanus 03/16/2021        Objective:     BP 120/80 (BP Location: Left Arm, Patient Position: Sitting, Cuff Size: Normal)   Pulse 85   Temp 98.2 F (36.8 C) (Oral)   Ht 5' 8 (1.727 m)   Wt 244 lb (110.7 kg)   SpO2 96%   BMI 37.10 kg/m   SpO2: 96 %  GENERAL: Obese, well-developed gentleman, no acute distress.  No conversational dyspnea.  No hoarseness. HEAD: Normocephalic, atraumatic.  EYES: Pupils equal,  round, reactive to light.  No scleral icterus.  MOUTH: Dentition intact, oral mucosa moist.  No thrush. NECK: Supple. No thyromegaly. Trachea midline. No JVD.  No adenopathy. PULMONARY: Good air entry bilaterally.  No adventitious sounds. CARDIOVASCULAR: S1 and S2. Regular rate and rhythm.  No rubs, murmurs or gallops heard. ABDOMEN: Obese,  otherwise benign. MUSCULOSKELETAL: No joint deformity, no clubbing, no edema.  NEUROLOGIC: No overt focal deficit, no gait disturbance, speech is fluent. SKIN: Intact,warm,dry. PSYCH: Mood and behavior normal.  Lab Results  Component Value Date   NITRICOXIDE 51 11/08/2023  *There is evidence of high levels of type II inflammation.       Assessment & Plan:     ICD-10-CM   1. Persistent asthma with undetermined severity  J45.998 Pulmonary function test    2. Other cough  R05.8 Nitric oxide     Pulmonary function test    3. Chronic pansinusitis  J32.4     4. Gastroesophageal cancer (HCC)  C16.0       Orders Placed This Encounter  Procedures   Nitric oxide    Pulmonary function test    Standing Status:   Future    Expected Date:   12/09/2023    Expiration Date:   11/07/2024    Where should this test be performed?:   Outpatient Pulmonary    What type of PFT is being ordered?:   Full PFT    Meds ordered this encounter  Medications   budesonide -formoterol  (SYMBICORT ) 160-4.5 MCG/ACT inhaler    Sig: Inhale 2 puffs into the lungs 2 (two) times daily.    Dispense:  10.2 g    Refill:  11   albuterol  (VENTOLIN  HFA) 108 (90 Base) MCG/ACT inhaler    Sig: Inhale 2 puffs into the lungs every 6 (six) hours as needed.    Dispense:  8 g    Refill:  2   Discussion:    GERD-induced asthma Chronic cough and respiratory issues likely exacerbated by GERD and chronic pansinusitis. Recent bronchitis and pneumonia episodes. Elevated airway inflammation with nitric oxide  test showing 51 ppb, consistent with type II (IL-13 driven) inflammation. - Prescribe  Symbicort  inhaler, 160/4.5, 2 inhalations twice a day. Ensure he rinses mouth after use. - Prescribe albuterol  inhaler as rescue inhaler. - Order pulmonary function tests. - Advise to report if sputum changes character/color for potential antibiotic treatment.  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 Currently NED for about a year. Previous cancer treatment has led to immunosuppression and complications such as adrenal insufficiency and hypothyroidism.  Adrenal insufficiency Adrenal insufficiency secondary to cancer treatment. Currently managed with steroid therapy, but requires careful dose adjustments.  Hypothyroidism Hypothyroidism secondary to cancer treatment. Currently managed with thyroid  hormone replacement therapy, including liothyronine.  Sleep apnea Sleep apnea managed with CPAP for the past six years. Reports significant improvement in sleep quality.  REM sleep behavior disorder REM sleep behavior disorder, currently managed by sleep specialists at [facility].  Peripheral neuropathy Peripheral neuropathy in the left foot secondary to chemotherapy.  Anxiety Anxiety exacerbated by cancer diagnosis and treatment.      Advised if symptoms do not improve or worsen, to please contact office for sooner follow up or seek emergency care.    I spent 60 minutes of dedicated to the care of this patient on the date of this encounter to include pre-visit review of records, face-to-face time with the patient discussing conditions above, post visit ordering of testing, clinical documentation with the electronic health record, making appropriate referrals as documented, and communicating necessary findings to members of the patients care team.   C. Leita Sanders, MD Advanced Bronchoscopy PCCM  [facility]    *This note was dictated using voice recognition software/Dragon.  Despite best efforts to  proofread, errors can occur which can change  the meaning. Any transcriptional errors that result from this process are unintentional and may not be fully corrected at the time of dictation.

## 2023-11-08 NOTE — Patient Instructions (Addendum)
 VISIT SUMMARY:  Today, you were seen for a persistent cough and your history of respiratory issues, including recent bronchitis and pneumonia. We discussed your ongoing management of gastroesophageal junction cancer, adrenal insufficiency, hypothyroidism, sleep apnea, REM sleep behavior disorder, peripheral neuropathy, and anxiety.  YOUR PLAN:  -GERD-INDUCED ASTHMA: GERD-induced asthma is asthma triggered by acid reflux. Your chronic cough and respiratory issues are likely worsened by this condition. We have prescribed a Symbicort inhaler (two puffs twice a day) and an albuterol  inhaler for emergency use. Please rinse your mouth after using Symbicort. We will also conduct pulmonary function tests. If your sputum changes color, please report it as you may need antibiotics.  -GASTROESOPHAGEAL JUNCTION CANCER WITH METASTASES: You have stage IV gastroesophageal junction cancer with metastases to the head. You have been NED (no evidence of disease) for about a year. Your previous cancer treatment has caused immunosuppression and complications like adrenal insufficiency and hypothyroidism.  -PITUITARY INSUFFICIENCY: Pituitary insufficiency means your adrenal glands do not produce enough hormones. This condition is secondary to your cancer treatment and is managed with steroid therapy, which requires careful dose adjustments.  -HYPOTHYROIDISM: Hypothyroidism is when your thyroid  gland does not produce enough hormones. This condition is secondary to your cancer treatment and is managed with thyroid  hormone replacement therapy, including liothyronine.  -SLEEP APNEA: Sleep apnea is a condition where your breathing stops and starts during sleep. You have been managing this with a CPAP machine for the past six years, which has significantly improved your sleep quality.  -REM SLEEP BEHAVIOR DISORDER: REM sleep behavior disorder causes you to act out your dreams. This condition is currently managed by sleep  specialists at [facility]  -PERIPHERAL NEUROPATHY: Peripheral neuropathy is nerve damage that causes weakness, numbness, and pain, usually in the hands and feet. Your neuropathy in the left foot is secondary to chemotherapy.  -ANXIETY: Anxiety is a feeling of worry or fear. Your anxiety has been exacerbated by your cancer diagnosis and treatment.  INSTRUCTIONS:  Please follow up with the pulmonary function tests as ordered. Report any changes in your sputum color. Continue using your CPAP machine and follow the prescribed inhaler regimen. Adjust your steroid therapy as needed and continue with your thyroid  hormone replacement therapy. If you have any new symptoms or concerns, please contact our office.

## 2023-11-09 ENCOUNTER — Telehealth: Payer: Self-pay

## 2023-11-09 DIAGNOSIS — J0141 Acute recurrent pansinusitis: Secondary | ICD-10-CM

## 2023-11-09 MED ORDER — AMOXICILLIN-POT CLAVULANATE 875-125 MG PO TABS: 1.0000 | ORAL_TABLET | Freq: Two times a day (BID) | ORAL | 0 refills | Status: AC

## 2023-11-09 NOTE — Telephone Encounter (Signed)
Prescription for Augmentin sent to pharmacy.

## 2023-11-09 NOTE — Telephone Encounter (Signed)
 Copied from CRM [id number]. Topic: Clinical - Medication Question >> Nov 09, 2023  1:27 PM Jose Hayes wrote: Reason for CRM: Patient was advised to inform Dr. Tamea if his sputum turned green, and it has. Please send antibiotic to [facility] - [place], [place] - [address] [place] [address] Phone: [phone] Fax: [phone]

## 2023-11-09 NOTE — Telephone Encounter (Signed)
 Patient advised. NFN.

## 2023-11-10 DIAGNOSIS — M9902 Segmental and somatic dysfunction of thoracic region: Secondary | ICD-10-CM | POA: Diagnosis not present

## 2023-11-10 DIAGNOSIS — M9901 Segmental and somatic dysfunction of cervical region: Secondary | ICD-10-CM | POA: Diagnosis not present

## 2023-11-10 DIAGNOSIS — M546 Pain in thoracic spine: Secondary | ICD-10-CM | POA: Diagnosis not present

## 2023-11-10 DIAGNOSIS — M542 Cervicalgia: Secondary | ICD-10-CM | POA: Diagnosis not present

## 2023-11-10 DIAGNOSIS — M9903 Segmental and somatic dysfunction of lumbar region: Secondary | ICD-10-CM | POA: Diagnosis not present

## 2023-11-15 DIAGNOSIS — K2270 Barrett's esophagus without dysplasia: Secondary | ICD-10-CM | POA: Diagnosis not present

## 2023-11-15 DIAGNOSIS — Z8501 Personal history of malignant neoplasm of esophagus: Secondary | ICD-10-CM | POA: Diagnosis not present

## 2023-11-16 DIAGNOSIS — M9902 Segmental and somatic dysfunction of thoracic region: Secondary | ICD-10-CM | POA: Diagnosis not present

## 2023-11-16 DIAGNOSIS — M9903 Segmental and somatic dysfunction of lumbar region: Secondary | ICD-10-CM | POA: Diagnosis not present

## 2023-11-16 DIAGNOSIS — M542 Cervicalgia: Secondary | ICD-10-CM | POA: Diagnosis not present

## 2023-11-16 DIAGNOSIS — M9901 Segmental and somatic dysfunction of cervical region: Secondary | ICD-10-CM | POA: Diagnosis not present

## 2023-11-16 DIAGNOSIS — M546 Pain in thoracic spine: Secondary | ICD-10-CM | POA: Diagnosis not present

## 2023-11-21 DIAGNOSIS — M542 Cervicalgia: Secondary | ICD-10-CM | POA: Diagnosis not present

## 2023-11-21 DIAGNOSIS — M9901 Segmental and somatic dysfunction of cervical region: Secondary | ICD-10-CM | POA: Diagnosis not present

## 2023-11-21 DIAGNOSIS — M9902 Segmental and somatic dysfunction of thoracic region: Secondary | ICD-10-CM | POA: Diagnosis not present

## 2023-11-21 DIAGNOSIS — M9903 Segmental and somatic dysfunction of lumbar region: Secondary | ICD-10-CM | POA: Diagnosis not present

## 2023-11-21 DIAGNOSIS — M546 Pain in thoracic spine: Secondary | ICD-10-CM | POA: Diagnosis not present

## 2023-11-23 ENCOUNTER — Encounter: Payer: Self-pay | Admitting: Student in an Organized Health Care Education/Training Program

## 2023-11-23 ENCOUNTER — Ambulatory Visit
Admission: RE | Admit: 2023-11-23 | Discharge: 2023-11-23 | Disposition: A | Discharge status: Home / Self Care | Source: Ambulatory Visit | Attending: Student in an Organized Health Care Education/Training Program | Admitting: Student in an Organized Health Care Education/Training Program

## 2023-11-23 ENCOUNTER — Ambulatory Visit (HOSPITAL_BASED_OUTPATIENT_CLINIC_OR_DEPARTMENT_OTHER): Admitting: Student in an Organized Health Care Education/Training Program

## 2023-11-23 DIAGNOSIS — M5416 Radiculopathy, lumbar region: Secondary | ICD-10-CM | POA: Insufficient documentation

## 2023-11-23 DIAGNOSIS — G8929 Other chronic pain: Secondary | ICD-10-CM

## 2023-11-23 MED ORDER — LIDOCAINE HCL 2 % IJ SOLN
20.0000 mL | Freq: Once | INTRAMUSCULAR | Status: AC
  Administered 2023-11-23: 400 mg
  Filled 2023-11-23: qty 40

## 2023-11-23 MED ORDER — SODIUM CHLORIDE 0.9% FLUSH
1.0000 mL | Freq: Once | INTRAVENOUS | Status: AC
  Administered 2023-11-23: 1 mL

## 2023-11-23 MED ORDER — DIAZEPAM 5 MG PO TABS
10.0000 mg | ORAL_TABLET | Freq: Once | ORAL | Status: AC
  Administered 2023-11-23: 10 mg via ORAL

## 2023-11-23 MED ORDER — DEXAMETHASONE SODIUM PHOSPHATE 10 MG/ML IJ SOLN
INTRAMUSCULAR | Status: AC
Start: 2023-11-23 — End: 2023-11-23
  Filled 2023-11-23: qty 1

## 2023-11-23 MED ORDER — SODIUM CHLORIDE (PF) 0.9 % IJ SOLN
INTRAMUSCULAR | Status: AC
  Filled 2023-11-23: qty 10

## 2023-11-23 MED ORDER — DEXAMETHASONE SODIUM PHOSPHATE 10 MG/ML IJ SOLN
10.0000 mg | Freq: Once | INTRAMUSCULAR | Status: AC
  Administered 2023-11-23: 10 mg
  Filled 2023-11-23: qty 1

## 2023-11-23 MED ORDER — IOHEXOL 180 MG/ML  SOLN
10.0000 mL | Freq: Once | INTRAMUSCULAR | Status: AC
  Administered 2023-11-23: 10 mL via EPIDURAL
  Filled 2023-11-23: qty 20

## 2023-11-23 MED ORDER — DIAZEPAM 5 MG PO TABS
ORAL_TABLET | ORAL | Status: AC
  Filled 2023-11-23: qty 2

## 2023-11-23 MED ORDER — ROPIVACAINE HCL 2 MG/ML IJ SOLN
1.0000 mL | Freq: Once | INTRAMUSCULAR | Status: AC
  Administered 2023-11-23: 1 mL via EPIDURAL
  Filled 2023-11-23: qty 20

## 2023-11-23 NOTE — Progress Notes (Signed)
 PROVIDER NOTE: Interpretation of information contained herein should be left to medically-trained personnel. Specific patient instructions are provided elsewhere under Patient Instructions section of medical record. This document was created in part using STT-dictation technology, any transcriptional errors that may result from this process are unintentional.  Patient: Jose Hayes Type: Established DOB: August 01, 1960 MRN: [id number] PCP: Abbey Bruckner, MD  Service: Procedure DOS: 11/23/2023 Setting: Ambulatory Location: Ambulatory outpatient facility Delivery: Face-to-face Provider: Wallie Sherry, MD Specialty: Interventional Pain Management Specialty designation: 09 Location: Outpatient facility Ref. Prov.: Sherry Wallie, MD       Interventional Therapy   Procedure: Lumbar trans-foraminal epidural steroid injection (L-TFESI) #1  Laterality: Left (-LT)  Level: L5 nerve root(s) Imaging: Fluoroscopy-guided         Anesthesia: Local anesthesia (1-2% Lidocaine ) Sedation: Minimal Sedation                       DOS: 11/23/2023  Performed by: Wallie Sherry, MD  Purpose: Diagnostic/Therapeutic Indications: Lumbar radicular pain severe enough to impact quality of life or function. 1. Lumbar radiculopathy   2. Chronic radicular lumbar pain    NAS-11 Pain score:   Pre-procedure: 4 /10   Post-procedure: 5 /10     Position / Prep / Materials:  Position: Prone  Prep solution: ChloraPrep (2% chlorhexidine  gluconate and 70% isopropyl alcohol) Prep Area: Entire Posterior Lumbosacral Area.  From the lower tip of the scapula down to the tailbone and from flank to flank. Materials:  Tray: Block Needle(s):  Type: Spinal  Gauge (G): 22  Length: 5-in  Qty: 1     H&P (Pre-op Assessment):  Jose Hayes is a 63 y.o. (year old), male patient, seen today for interventional treatment. He  has a past surgical history that includes Shoulder surgery (Right, 1985, 1991, 2000); Laparoscopic  cholecystectomy (1991); Colonoscopy; Upper gastrointestinal endoscopy; swidom teeth extraction; and Knee arthroscopy (Right, 07/30/2021). Jose Hayes has a current medication list which includes the following prescription(s): albuterol , alprazolam , amlodipine , budesonide -formoterol , calcium -vitamin d, esomeprazole , fluticasone , gabapentin , gabapentin , hydrocortisone , hydrocortisone  sodium succinate , levothyroxine , liothyronine, rosuvastatin , sertraline, testosterone  cypionate, tramadol , and levothyroxine . His primarily concern today is the Back Pain (Radiates from lower back to foot into toes )  Initial Vital Signs:  Pulse/HCG Rate: 66ECG Heart Rate: 74 Temp: 98.1 F (36.7 C) Resp: 20 BP: (!) 141/94 SpO2: 97 %  BMI: Estimated body mass index is 36.49 kg/m as calculated from the following:   Height as of this encounter: 5' 8 (1.727 m).   Weight as of this encounter: 240 lb (108.9 kg).  Risk Assessment: Allergies: Reviewed. He has no known allergies.  Allergy Precautions: None required Coagulopathies: Reviewed. None identified.  Blood-thinner therapy: None at this time Active Infection(s): Reviewed. None identified. Jose Hayes is afebrile  Site Confirmation: Jose Hayes was asked to confirm the procedure and laterality before marking the site Procedure checklist: Completed Consent: Before the procedure and under the influence of no sedative(s), amnesic(s), or anxiolytics, the patient was informed of the treatment options, risks and possible complications. To fulfill our ethical and legal obligations, as recommended by the American Medical Association's Code of Ethics, I have informed the patient of my clinical impression; the nature and purpose of the treatment or procedure; the risks, benefits, and possible complications of the intervention; the alternatives, including doing nothing; the risk(s) and benefit(s) of the alternative treatment(s) or procedure(s); and the risk(s) and benefit(s)  of doing nothing. The patient was provided information about the general risks and possible  complications associated with the procedure. These may include, but are not limited to: failure to achieve desired goals, infection, bleeding, organ or nerve damage, allergic reactions, paralysis, and death. In addition, the patient was informed of those risks and complications associated to Spine-related procedures, such as failure to decrease pain; infection (i.e.: Meningitis, epidural or intraspinal abscess); bleeding (i.e.: epidural hematoma, subarachnoid hemorrhage, or any other type of intraspinal or peri-dural bleeding); organ or nerve damage (i.e.: Any type of peripheral nerve, nerve root, or spinal cord injury) with subsequent damage to sensory, motor, and/or autonomic systems, resulting in permanent pain, numbness, and/or weakness of one or several areas of the body; allergic reactions; (i.e.: anaphylactic reaction); and/or death. Furthermore, the patient was informed of those risks and complications associated with the medications. These include, but are not limited to: allergic reactions (i.e.: anaphylactic or anaphylactoid reaction(s)); adrenal axis suppression; blood sugar elevation that in diabetics may result in ketoacidosis or comma; water retention that in patients with history of congestive heart failure may result in shortness of breath, pulmonary edema, and decompensation with resultant heart failure; weight gain; swelling or edema; medication-induced neural toxicity; particulate matter embolism and blood vessel occlusion with resultant organ, and/or nervous system infarction; and/or aseptic necrosis of one or more joints. Finally, the patient was informed that Medicine is not an exact science; therefore, there is also the possibility of unforeseen or unpredictable risks and/or possible complications that may result in a catastrophic outcome. The patient indicated having understood very clearly. We  have given the patient no guarantees and we have made no promises. Enough time was given to the patient to ask questions, all of which were answered to the patient's satisfaction. Mr. Harty has indicated that he wanted to continue with the procedure. Attestation: I, the ordering provider, attest that I have discussed with the patient the benefits, risks, side-effects, alternatives, likelihood of achieving goals, and potential problems during recovery for the procedure that I have provided informed consent. Date  Time: 11/23/2023  2:20 PM  Pre-Procedure Preparation:  Monitoring: As per clinic protocol. Respiration, ETCO2, SpO2, BP, heart rate and rhythm monitor placed and checked for adequate function Safety Precautions: Patient was assessed for positional comfort and pressure points before starting the procedure. Time-out: I initiated and conducted the Time-out before starting the procedure, as per protocol. The patient was asked to participate by confirming the accuracy of the Time Out information. Verification of the correct person, site, and procedure were performed and confirmed by me, the nursing staff, and the patient. Time-out conducted as per Joint Commission's Universal Protocol (UP.01.01.01). Time: 1511 Start Time: 1511 hrs.  Description/Narrative of Procedure:          Target: The 6 o'clock position under the pedicle, on the affected side. Region: Posterolateral Lumbosacral Approach: Posterior Percutaneous Paravertebral approach.  Rationale (medical necessity): procedure needed and proper for the diagnosis and/or treatment of the patient's medical symptoms and needs. Procedural Technique Safety Precautions: Aspiration looking for blood return was conducted prior to all injections. At no point did we inject any substances, as a needle was being advanced. No attempts were made at seeking any paresthesias. Safe injection practices and needle disposal techniques used. Medications  properly checked for expiration dates. SDV (single dose vial) medications used. Description of the Procedure: Protocol guidelines were followed. The patient was placed in position over the procedure table. The target area was identified and the area prepped in the usual manner. Skin & deeper tissues infiltrated with local anesthetic. Appropriate amount  of time allowed to pass for local anesthetics to take effect. The procedure needles were then advanced to the target area. Proper needle placement secured. Negative aspiration confirmed. Solution injected in intermittent fashion, asking for systemic symptoms every 0.5cc of injectate. The needles were then removed and the area cleansed, making sure to leave some of the prepping solution back to take advantage of its long term bactericidal properties.  Vitals:   11/23/23 1418 11/23/23 1500 11/23/23 1505 11/23/23 1514  BP: (!) 141/94 (!) 134/96 (!) 134/96 122/88  Pulse: 66     Resp: 20 17 18 15   Temp: 98.1 F (36.7 C)     SpO2: 97% 99% 98% 99%  Weight: 240 lb (108.9 kg)     Height: 5' 8 (1.727 m)       Start Time: 1511 hrs. End Time: 1522 hrs.  Imaging Guidance (Spinal):          Type of Imaging Technique: Fluoroscopy Guidance (Spinal) Indication(s): Fluoroscopy guidance for needle placement to enhance accuracy in procedures requiring precise needle localization for targeted delivery of medication in or near specific anatomical locations not easily accessible without such real-time imaging assistance. Exposure Time: Please see nurses notes. Contrast: Before injecting any contrast, we confirmed that the patient did not have an allergy to iodine , shellfish, or radiological contrast. Once satisfactory needle placement was completed at the desired level, radiological contrast was injected. Contrast injected under live fluoroscopy. No contrast complications. See chart for type and volume of contrast used. Fluoroscopic Guidance: I was personally present  during the use of fluoroscopy. Tunnel Vision Technique used to obtain the best possible view of the target area. Parallax error corrected before commencing the procedure. Direction-depth-direction technique used to introduce the needle under continuous pulsed fluoroscopy. Once target was reached, antero-posterior, oblique, and lateral fluoroscopic projection used confirm needle placement in all planes. Images permanently stored in EMR. Interpretation: I personally interpreted the imaging intraoperatively. Adequate needle placement confirmed in multiple planes. Appropriate spread of contrast into desired area was observed. No evidence of afferent or efferent intravascular uptake. No intrathecal or subarachnoid spread observed. Permanent images saved into the patient's record.  Post-operative Assessment:  Post-procedure Vital Signs:  Pulse/HCG Rate: 6671 Temp: 98.1 F (36.7 C) Resp: 15 BP: 122/88 SpO2: 99 %  EBL: None  Complications: No immediate post-treatment complications observed by team, or reported by patient.  Note: The patient tolerated the entire procedure well. A repeat set of vitals were taken after the procedure and the patient was kept under observation following institutional policy, for this type of procedure. Post-procedural neurological assessment was performed, showing return to baseline, prior to discharge. The patient was provided with post-procedure discharge instructions, including a section on how to identify potential problems. Should any problems arise concerning this procedure, the patient was given instructions to immediately contact us , at any time, without hesitation. In any case, we plan to contact the patient by telephone for a follow-up status report regarding this interventional procedure.  Comments:  No additional relevant information.  Plan of Care (POC)  Orders:  Orders Placed This Encounter  Procedures   DG PAIN CLINIC C-ARM 1-60 MIN NO REPORT     Intraoperative interpretation by procedural physician at [facility].    Standing Status:   Standing    Number of Occurrences:   1    Reason for exam::   Assistance in needle guidance and placement for procedures requiring needle placement in or near specific anatomical locations not easily accessible  without such assistance.   Recent Imaging Review  MR LUMBAR SPINE WO CONTRAST EXAM DESCRIPTION: MR LUMBAR SPINE WO CONTRAST  CLINICAL HISTORY: Low back pain, symptoms persist with > 6 wks treatment; Lumbar radiculopathy, symptoms persist with > 6 wks treatment; left leg tingling/pain  COMPARISON: None Available.  TECHNIQUE: MRI of the lumbar spine is performed according to our usual protocol with axial and sagittal multi sequence imaging.  FINDINGS: Mild anterolisthesis of L5 on S1 related to chronic L5 pars defects the alignment is otherwise unremarkable. Mild degenerative disc disease throughout. No endplate marrow edema. The marrow signal is unremarkable as well as the conus and cauda equina.  L3-4 has mild bilateral foraminal narrowing due to broad-based disc bulge and facet hypertrophy.  L4-5 has mild foraminal narrowing on the right due to broad-based disc bulge and facet hypertrophy.  L5-S1 has mild foraminal narrowing on the right due to broad-based disc bulge and facet hypertrophy. There is a small left foraminal disc osteophyte complex combined with facet hypertrophy to cause moderate foraminal narrowing the left with possible contact of the exiting L5 nerve.  The image levels are otherwise unremarkable.  The imaged retroperitoneal structures are unremarkable.  IMPRESSION: Mild anterolisthesis of L5 on S1 related to chronic L5 pars defects.  Multilevel degenerative spondylosis with levels described in detail above.  L5-S1 has a small left foraminal disc osteophyte complex contributing to moderate foraminal narrowing with suspected contact of the exiting  L5 nerve.  Electronically signed by: Reyes Frees MD 11/02/2023 07:19 AM EDT RP Workstation: [id number]   Plan of Care   Medications ordered for procedure: Meds ordered this encounter  Medications   iohexol  (OMNIPAQUE ) 180 MG/ML injection 10 mL    Must be Myelogram-compatible. If not available, you may substitute with a water-soluble, non-ionic, hypoallergenic, myelogram-compatible radiological contrast medium.   lidocaine  (XYLOCAINE ) 2 % (with pres) injection 400 mg   diazepam  (VALIUM ) tablet 10 mg    Make sure Flumazenil is available in the pyxis when using this medication. If oversedation occurs, administer 0.2 mg IV over 15 sec. If after 45 sec no response, administer 0.2 mg again over 1 min; may repeat at 1 min intervals; not to exceed 4 doses (1 mg)   ropivacaine  (PF) 2 mg/mL (0.2%) (NAROPIN ) injection 1 mL   sodium chloride  flush (NS) 0.9 % injection 1 mL   dexamethasone  (DECADRON ) injection 10 mg   Medications administered: We administered iohexol , lidocaine , diazepam , ropivacaine  (PF) 2 mg/mL (0.2%), sodium chloride  flush, and dexamethasone .  See the medical record for exact dosing, route, and time of administration.   Follow-up plan:   Return in about 4 weeks (around 12/21/2023), or PPE F2F.     Recent Visits Date Type Provider Dept  11/07/23 Office Visit Marcelino Nurse, MD [facility]  10/13/23 Office Visit Marcelino Nurse, MD [facility]  Showing recent visits within past 90 days and meeting all other requirements Today's Visits Date Type Provider Dept  11/23/23 Procedure visit Marcelino Nurse, MD [facility]  Showing today's visits and meeting all other requirements Future Appointments Date Type Provider Dept  12/22/23 Appointment Marcelino Nurse, MD [facility]  Showing future appointments within next 90 days and meeting all other requirements   Disposition: Discharge home  Discharge (Date  Time): 11/23/2023; 1323 hrs.    Primary Care Physician: Bair, Kalpana, MD Location: [facility] Note by: Nurse Marcelino, MD (TTS technology used. I apologize for any typographical errors that were not detected  and corrected.) Date: 11/23/2023; Time: 3:35 PM  Disclaimer:  Medicine is not an Visual merchandiser. The only guarantee in medicine is that nothing is guaranteed. It is important to note that the decision to proceed with this intervention was based on the information collected from the patient. The Data and conclusions were drawn from the patient's questionnaire, the interview, and the physical examination. Because the information was provided in large part by the patient, it cannot be guaranteed that it has not been purposely or unconsciously manipulated. Every effort has been made to obtain as much relevant data as possible for this evaluation. It is important to note that the conclusions that lead to this procedure are derived in large part from the available data. Always take into account that the treatment will also be dependent on availability of resources and existing treatment guidelines, considered by other Pain Management Practitioners as being common knowledge and practice, at the time of the intervention. For Medico-Legal purposes, it is also important to point out that variation in procedural techniques and pharmacological choices are the acceptable norm. The indications, contraindications, technique, and results of the above procedure should only be interpreted and judged by a Board-Certified Interventional Pain Specialist with extensive familiarity and expertise in the same exact procedure and technique.

## 2023-11-23 NOTE — Patient Instructions (Signed)

## 2023-11-23 NOTE — Progress Notes (Signed)
 Safety precautions to be maintained throughout the outpatient stay will include: orient to surroundings, keep bed in low position, maintain call bell within reach at all times, provide assistance with transfer out of bed and ambulation.

## 2023-11-24 ENCOUNTER — Telehealth: Payer: Self-pay

## 2023-11-24 NOTE — Telephone Encounter (Signed)
Post procedure follow up. Patient states he is doing ok.  

## 2023-11-25 DIAGNOSIS — C160 Malignant neoplasm of cardia: Secondary | ICD-10-CM | POA: Diagnosis not present

## 2023-11-28 DIAGNOSIS — M542 Cervicalgia: Secondary | ICD-10-CM | POA: Diagnosis not present

## 2023-11-28 DIAGNOSIS — M9902 Segmental and somatic dysfunction of thoracic region: Secondary | ICD-10-CM | POA: Diagnosis not present

## 2023-11-28 DIAGNOSIS — M9903 Segmental and somatic dysfunction of lumbar region: Secondary | ICD-10-CM | POA: Diagnosis not present

## 2023-11-28 DIAGNOSIS — M546 Pain in thoracic spine: Secondary | ICD-10-CM | POA: Diagnosis not present

## 2023-11-28 DIAGNOSIS — M9901 Segmental and somatic dysfunction of cervical region: Secondary | ICD-10-CM | POA: Diagnosis not present

## 2023-11-30 DIAGNOSIS — M2550 Pain in unspecified joint: Secondary | ICD-10-CM | POA: Diagnosis not present

## 2023-11-30 DIAGNOSIS — T45AX5D Adverse effect of immune checkpoint inhibitors and immunostimulant drugs, subsequent encounter: Secondary | ICD-10-CM | POA: Diagnosis not present

## 2023-11-30 DIAGNOSIS — Z9289 Personal history of other medical treatment: Secondary | ICD-10-CM | POA: Diagnosis not present

## 2023-11-30 DIAGNOSIS — E273 Drug-induced adrenocortical insufficiency: Secondary | ICD-10-CM | POA: Diagnosis not present

## 2023-11-30 DIAGNOSIS — E669 Obesity, unspecified: Secondary | ICD-10-CM | POA: Diagnosis not present

## 2023-11-30 DIAGNOSIS — E039 Hypothyroidism, unspecified: Secondary | ICD-10-CM | POA: Diagnosis not present

## 2023-11-30 DIAGNOSIS — G4733 Obstructive sleep apnea (adult) (pediatric): Secondary | ICD-10-CM | POA: Diagnosis not present

## 2023-11-30 DIAGNOSIS — E291 Testicular hypofunction: Secondary | ICD-10-CM | POA: Diagnosis not present

## 2023-11-30 DIAGNOSIS — E2740 Unspecified adrenocortical insufficiency: Secondary | ICD-10-CM | POA: Diagnosis not present

## 2023-12-02 DIAGNOSIS — M542 Cervicalgia: Secondary | ICD-10-CM | POA: Diagnosis not present

## 2023-12-02 DIAGNOSIS — M2559 Pain in other specified joint: Secondary | ICD-10-CM | POA: Diagnosis not present

## 2023-12-02 DIAGNOSIS — M546 Pain in thoracic spine: Secondary | ICD-10-CM | POA: Diagnosis not present

## 2023-12-02 DIAGNOSIS — M9901 Segmental and somatic dysfunction of cervical region: Secondary | ICD-10-CM | POA: Diagnosis not present

## 2023-12-02 DIAGNOSIS — M2550 Pain in unspecified joint: Secondary | ICD-10-CM | POA: Insufficient documentation

## 2023-12-02 DIAGNOSIS — M9903 Segmental and somatic dysfunction of lumbar region: Secondary | ICD-10-CM | POA: Diagnosis not present

## 2023-12-02 DIAGNOSIS — M9902 Segmental and somatic dysfunction of thoracic region: Secondary | ICD-10-CM | POA: Diagnosis not present

## 2023-12-02 DIAGNOSIS — T45AX5A Adverse effect of immune checkpoint inhibitors and immunostimulant drugs, initial encounter: Secondary | ICD-10-CM | POA: Insufficient documentation

## 2023-12-07 DIAGNOSIS — M542 Cervicalgia: Secondary | ICD-10-CM | POA: Diagnosis not present

## 2023-12-07 DIAGNOSIS — M546 Pain in thoracic spine: Secondary | ICD-10-CM | POA: Diagnosis not present

## 2023-12-07 DIAGNOSIS — M9901 Segmental and somatic dysfunction of cervical region: Secondary | ICD-10-CM | POA: Diagnosis not present

## 2023-12-07 DIAGNOSIS — M9902 Segmental and somatic dysfunction of thoracic region: Secondary | ICD-10-CM | POA: Diagnosis not present

## 2023-12-07 DIAGNOSIS — M9903 Segmental and somatic dysfunction of lumbar region: Secondary | ICD-10-CM | POA: Diagnosis not present

## 2023-12-09 DIAGNOSIS — G4733 Obstructive sleep apnea (adult) (pediatric): Secondary | ICD-10-CM | POA: Diagnosis not present

## 2023-12-13 ENCOUNTER — Other Ambulatory Visit

## 2023-12-13 ENCOUNTER — Other Ambulatory Visit (INDEPENDENT_AMBULATORY_CARE_PROVIDER_SITE_OTHER)

## 2023-12-13 DIAGNOSIS — I10 Essential (primary) hypertension: Secondary | ICD-10-CM | POA: Diagnosis not present

## 2023-12-13 DIAGNOSIS — E782 Mixed hyperlipidemia: Secondary | ICD-10-CM | POA: Diagnosis not present

## 2023-12-13 DIAGNOSIS — R7303 Prediabetes: Secondary | ICD-10-CM | POA: Diagnosis not present

## 2023-12-14 ENCOUNTER — Ambulatory Visit: Payer: Self-pay

## 2023-12-14 LAB — COMPREHENSIVE METABOLIC PANEL WITH GFR
ALT: 21 U/L (ref 0–53)
AST: 22 U/L (ref 0–37)
Albumin: 4.7 g/dL (ref 3.5–5.2)
Alkaline Phosphatase: 52 U/L (ref 39–117)
BUN: 19 mg/dL (ref 6–23)
CO2: 30 meq/L (ref 19–32)
Calcium: 9.3 mg/dL (ref 8.4–10.5)
Chloride: 100 meq/L (ref 96–112)
Creatinine, Ser: 1.11 mg/dL (ref 0.40–1.50)
GFR: 70.91 mL/min (ref >60.00)
Glucose, Bld: 87 mg/dL (ref 70–99)
Potassium: 4.5 meq/L (ref 3.5–5.1)
Sodium: 140 meq/L (ref 135–145)
Total Bilirubin: 1.1 mg/dL (ref 0.2–1.2)
Total Protein: 7 g/dL (ref 6.0–8.3)

## 2023-12-14 LAB — LIPID PANEL
Cholesterol: 207 mg/dL — ABNORMAL HIGH (ref 0–200)
HDL: 39.3 mg/dL (ref >39.00)
LDL Cholesterol: 153 mg/dL — ABNORMAL HIGH (ref 0–99)
NonHDL: 167.75
Total CHOL/HDL Ratio: 5
Triglycerides: 73 mg/dL (ref 0.0–149.0)
VLDL: 14.6 mg/dL (ref 0.0–40.0)

## 2023-12-14 LAB — HEMOGLOBIN A1C: Hgb A1c MFr Bld: 6.3 % (ref 4.6–6.5)

## 2023-12-14 NOTE — Progress Notes (Signed)
 To be discussed during OV on 12/15/23.   Luke Shade, MD

## 2023-12-15 ENCOUNTER — Ambulatory Visit (INDEPENDENT_AMBULATORY_CARE_PROVIDER_SITE_OTHER)

## 2023-12-15 VITALS — BP 110/70 | HR 71 | Temp 98.3°F | Ht 68.0 in | Wt 243.8 lb

## 2023-12-15 DIAGNOSIS — R7303 Prediabetes: Secondary | ICD-10-CM | POA: Diagnosis not present

## 2023-12-15 DIAGNOSIS — I10 Essential (primary) hypertension: Secondary | ICD-10-CM

## 2023-12-15 DIAGNOSIS — E782 Mixed hyperlipidemia: Secondary | ICD-10-CM | POA: Diagnosis not present

## 2023-12-15 DIAGNOSIS — F39 Unspecified mood [affective] disorder: Secondary | ICD-10-CM

## 2023-12-15 MED ORDER — ROSUVASTATIN CALCIUM 20 MG PO TABS: 20.0000 mg | ORAL_TABLET | Freq: Every day | ORAL | 3 refills | Status: AC

## 2023-12-15 NOTE — Progress Notes (Signed)
 Discussed during OV on 12/15/23.  Luke Shade, MD

## 2023-12-15 NOTE — Assessment & Plan Note (Signed)
 Patient currently taking Xanax  1 mg daily and Sertraline 150 mg. His anxiety is health related including previous diagnosis and treatment for cancer. He is going to continue taking current medications and discuss medication adjustment with psychiatrist with [facility] later this month.

## 2023-12-15 NOTE — Assessment & Plan Note (Addendum)
 Currently taking Amlodipine  5 mg once a day, BP on arrival was elevated. Repeat BP within goal. Continue Amlodipine  5 mg once a day. He does not need a refill at this time, he plans of f/u with cardiologist Dr. Gollan as well. Reviewed CMP from 12/13/23 with the patient.

## 2023-12-15 NOTE — Assessment & Plan Note (Signed)
 A1c from 12/13/23 discussed. Low carbohydrate diet, increased daily physical activities encouraged. Will continue to monitor A1c, repeat in 6 months.

## 2023-12-15 NOTE — Assessment & Plan Note (Signed)
 Reviewed lab results from 12/13/23, compared this with previous lipid results.  The 10-year ASCVD risk score (Arnett DK, et al., 2019) is: 11.7% Recommend restating Rosuvastatin  20 mg at bedtime.  Patient counseled on medication adherence,  risks (rare incidence of rhabdo, muscle, bone aches) and benefits (reducing ASCVD risk, lowering LDL cholesterol) of statin.  Also provided resource and counseling on mediterranean like diet, encouraged moderate intensity exercise.  F/U in 6 months with repeat fasting lipid panel.

## 2023-12-15 NOTE — Patient Instructions (Signed)
 Please discuss updating influenza, shingles and pneumonia vaccine with your cancer doctor. I highly recommend updating these immunization.    Restart Rosuvastatin  20 mg at bedtime.   Please follow up with your psychiatrist for adjustment for medication.

## 2023-12-15 NOTE — Progress Notes (Signed)
 Established Patient Office Visit   Subjective  Patient ID: Jose Hayes, male    DOB: [birth year]  Age: 63 y.o. MRN: [id number]  Chief Complaint  Patient presents with   Hypothyroidism   Pain    He  has a past medical history of Adrenal insufficiency (Addison's disease) (HCC), Allergy, AMS (altered mental status) (05/02/2022), Anxiety, Basal cell carcinoma, COVID-19, Esophageal cancer (HCC) (10/21/2016), Fall (05/30/2023), GERD (gastroesophageal reflux disease), Hyperglycemia, Hyperlipidemia, Hypertension, Left acoustic neuroma (HCC), Severe left groin pain (12/28/2022), Sinus bradycardia (12/30/2022), Sleep apnea, SVT (supraventricular tachycardia) (HCC), Thyroid  disease, and Tobacco abuse.  HPI Discussed the use of AI scribe software for clinical note transcription with the patient, who gave verbal consent to proceed.  History of Present Illness Jose Hayes is a 63 year old male presents to clinic to discuss recent lab results.   - His anxiety has been increasing over the past week and a half despite being on sertraline 150 mg for a long time. He has been taking Xanax  1 mg as needed, approximately once a day recently, to manage his anxiety. He is scheduled for a follow-up with behavioral health at [facility] in mid-July to August. No SI/HI.   - He underwent an endoscopy and colonoscopy recently, which was the first endoscopy since his cancer diagnosis. The endoscopy revealed Barrett's esophagus, and he is scheduled for an ablation in February. He is currently taking esomeprazole  40 mg twice a day. Symptoms seems to be stable on this regimen.   - He is suspected to have GERD induced asthma. He is established with pulmonologist Dr. Tamea. His symptoms has been stable on prn rescue Albuterol , BID symbicort . He denies cough, wheezing.   - He experiences chronic pain in multiple areas including his left foot, right foot, neck, right shoulder, and lower back. He had a nerve block and  epidural steroid injection for his left foot, which provided minimal relief. He is scheduled to see a rheumatologist at the end of August for further evaluation of his chronic pain. He is also following up with pain clinic and reports pain has been manageable with current treatment.   - He has a history of adrenal insufficiency and takes hydrocortisone  15 mg in the morning and 10 mg at 2 PM. He has the ability to adjust his dose if needed but aims to keep it at 25 mg daily to manage his energy levels and weight.  - He is on levothyroxine  75 mcg and liothyronine 5 mcg for thyroid  management. His endocrinologist at [facility] recently adjusted his liothyronine dose from 10 mcg to 5 mcg.  - He has a history of hypertension and takes amlodipine  5 mg once daily. He monitors his blood pressure at home, which has been stable, though he experienced a fainting spell previously when his blood pressure was low. As a result he has been taking Amlodipine  5 mg once daily from BID. He is planning on following up with cardiologist and does not need a refill at this time. Already established with Dr. Gollan.   - He takes gabapentin  100 mg twice a day with lunch and dinner, and 300 mg at bedtime, which has helped with his REM sleep behavior disorder.  - He has not started tirzepatide for weight loss due to cost concerns. His hemoglobin A1c has increased from 5.7% to 6.3% as evident on lab from 12/13/23.   - He has a history of hyperlipidemia and was previously on rosuvastatin  20 mg, which he stopped due to  medication management challenges. His cholesterol levels have increased since stopping the medication. He did not develop s/e when taking statin.   - He uses a CPAP machine nightly for sleep apnea and recently received a new machine four months ago.  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 He and his wife are incorporating more  structured exercise into their routine.  No recent use of the hydrocortisone  injection for adrenal crisis.   ROS As per HPI    Objective:     BP 110/70 (BP Location: Right Arm, Patient Position: Sitting, Cuff Size: Large)   Pulse 71   Temp 98.3 F (36.8 C) (Oral)   Ht 5' 8 (1.727 m)   Wt 243 lb 12.8 oz (110.6 kg)   SpO2 97%   BMI 37.07 kg/m      12/15/2023    9:11 AM 11/23/2023    2:26 PM 11/07/2023    2:28 PM  Depression screen PHQ 2/9  Decreased Interest 2 0 0  Down, Depressed, Hopeless 1 0 0  PHQ - 2 Score 3 0 0  Altered sleeping 2 0   Tired, decreased energy 3 0   Change in appetite 1    Feeling bad or failure about yourself  0 0   Trouble concentrating 1 0   Moving slowly or fidgety/restless 3 0   Suicidal thoughts 0 0   PHQ-9 Score 13 0   Difficult doing work/chores Extremely dIfficult Not difficult at all       12/15/2023    9:12 AM 09/09/2023   11:16 AM 05/30/2023    9:08 AM 04/25/2023   10:41 AM  GAD 7 : Generalized Anxiety Score  Nervous, Anxious, on Edge 3 3 2 2   Control/stop worrying 3 3 2 1   Worry too much - different things 3 2 2 2   Trouble relaxing 3 1 2 1   Restless 2 0 1 0  Easily annoyed or irritable 1 1 1 1   Afraid - awful might happen 2 2 1  0  Total GAD 7 Score 17 12 11 7   Anxiety Difficulty Extremely difficult Very difficult Extremely difficult Very difficult    Physical Exam Constitutional:      Appearance: He is obese.  HENT:     Head: Normocephalic and atraumatic.     Right Ear: Tympanic membrane normal.     Left Ear: Tympanic membrane normal.     Nose: No congestion.     Mouth/Throat:     Mouth: Mucous membranes are moist.  Cardiovascular:     Rate and Rhythm: Normal rate.     Pulses: Normal pulses.     Heart sounds: Normal heart sounds.  Pulmonary:     Effort: Pulmonary effort is normal.     Comments: Bibasilar rhonchi noted.  Musculoskeletal:     Cervical back: Neck supple. No rigidity.     Right lower leg: No edema.      Left lower leg: No edema.     Right foot: Normal range of motion and normal capillary refill. No swelling, deformity, foot drop, prominent metatarsal heads, laceration, tenderness, bony tenderness or crepitus. Normal pulse.     Left foot: Normal range of motion and normal capillary refill. No swelling, deformity, foot drop, prominent metatarsal heads, laceration, tenderness, bony tenderness or crepitus. Normal pulse.  Skin:    General: Skin is warm.     Findings: No lesion.  Neurological:  Mental Status: He is alert and oriented to person, place, and time. Mental status is at baseline.  Psychiatric:        Mood and Affect: Mood is anxious.        Behavior: Behavior is cooperative.        Thought Content: Thought content does not include homicidal or suicidal ideation. Thought content does not include homicidal or suicidal plan.     No results found for any visits on 12/15/23.  The 10-year ASCVD risk score (Arnett DK, et al., 2019) is: 11.7%  Following lab results discussed during today's visit:  Component     Latest Ref Rng 12/13/2023  Sodium     135 - 145 mEq/L 140   Potassium     3.5 - 5.1 mEq/L 4.5   Chloride     96 - 112 mEq/L 100   CO2     19 - 32 mEq/L 30   Glucose     70 - 99 mg/dL 87   BUN     6 - 23 mg/dL 19   Creatinine     9.59 - 1.50 mg/dL 8.88   Total Bilirubin     0.2 - 1.2 mg/dL 1.1   Alkaline Phosphatase     39 - 117 U/L 52   AST     0 - 37 U/L 22   ALT     0 - 53 U/L 21   Total Protein     6.0 - 8.3 g/dL 7.0   Albumin     3.5 - 5.2 g/dL 4.7   GFR     >39.99 mL/min 70.91   Calcium      8.4 - 10.5 mg/dL 9.3   Cholesterol     0 - 200 mg/dL 792 (H)   Triglycerides     0.0 - 149.0 mg/dL 26.9   HDL Cholesterol     >39.00 mg/dL 60.69   VLDL     0.0 - 40.0 mg/dL 85.3   LDL (calc)     0 - 99 mg/dL 846 (H)   Total CHOL/HDL Ratio 5   NonHDL 167.75   Hemoglobin A1C     4.6 - 6.5 % 6.3        Assessment & Plan:  Mixed  hyperlipidemia Assessment & Plan: Reviewed lab results from 12/13/23, compared this with previous lipid results.  The 10-year ASCVD risk score (Arnett DK, et al., 2019) is: 11.7% Recommend restating Rosuvastatin  20 mg at bedtime.  Patient counseled on medication adherence,  risks (rare incidence of rhabdo, muscle, bone aches) and benefits (reducing ASCVD risk, lowering LDL cholesterol) of statin.  Also provided resource and counseling on mediterranean like diet, encouraged moderate intensity exercise.  F/U in 6 months with repeat fasting lipid panel.   Orders: -     Rosuvastatin  Calcium ; Take 1 tablet (20 mg total) by mouth daily.  Dispense: 90 tablet; Refill: 3 -     Lipid panel; Future  Prediabetes Assessment & Plan: A1c from 12/13/23 discussed. Low carbohydrate diet, increased daily physical activities encouraged. Will continue to monitor A1c, repeat in 6 months.    Orders: -     Hemoglobin A1c; Future  Primary hypertension Assessment & Plan: Currently taking Amlodipine  5 mg once a day, BP on arrival was elevated. Repeat BP within goal. Continue Amlodipine  5 mg once a day. He does not need a refill at this time, he plans of f/u with cardiologist Dr. Gollan as well. Reviewed CMP from 12/13/23 with  the patient.    Mood disorder (HCC) Assessment & Plan: Patient currently taking Xanax  1 mg daily and Sertraline 150 mg. His anxiety is health related including previous diagnosis and treatment for cancer. He is going to continue taking current medications and discuss medication adjustment with psychiatrist with [facility] later this month.     I personally spent a total of 45 minutes in the care of the patient today including preparing to see the patient, getting/reviewing separately obtained history, performing a medically appropriate exam/evaluation, counseling and educating, placing orders, referring and communicating with other health care professionals, and documenting clinical information in the  EHR.   Return in about 6 months (around 06/16/2024) for fasting labs, couple od days before appointment .   Luke Shade, MD

## 2023-12-20 DIAGNOSIS — C44311 Basal cell carcinoma of skin of nose: Secondary | ICD-10-CM | POA: Diagnosis not present

## 2023-12-22 ENCOUNTER — Ambulatory Visit
Attending: Student in an Organized Health Care Education/Training Program | Admitting: Student in an Organized Health Care Education/Training Program

## 2023-12-22 VITALS — BP 132/81 | HR 67 | Temp 98.0°F | Resp 16 | Ht 68.0 in | Wt 240.0 lb

## 2023-12-22 DIAGNOSIS — G8929 Other chronic pain: Secondary | ICD-10-CM | POA: Diagnosis not present

## 2023-12-22 DIAGNOSIS — M51362 Other intervertebral disc degeneration, lumbar region with discogenic back pain and lower extremity pain: Secondary | ICD-10-CM | POA: Insufficient documentation

## 2023-12-22 DIAGNOSIS — M5416 Radiculopathy, lumbar region: Secondary | ICD-10-CM | POA: Diagnosis not present

## 2023-12-22 DIAGNOSIS — G894 Chronic pain syndrome: Secondary | ICD-10-CM | POA: Diagnosis not present

## 2023-12-22 DIAGNOSIS — M5442 Lumbago with sciatica, left side: Secondary | ICD-10-CM | POA: Diagnosis not present

## 2023-12-22 DIAGNOSIS — T451X5A Adverse effect of antineoplastic and immunosuppressive drugs, initial encounter: Secondary | ICD-10-CM | POA: Diagnosis not present

## 2023-12-22 DIAGNOSIS — G620 Drug-induced polyneuropathy: Secondary | ICD-10-CM | POA: Insufficient documentation

## 2023-12-22 NOTE — Patient Instructions (Signed)

## 2023-12-22 NOTE — Progress Notes (Signed)
 PROVIDER NOTE: Interpretation of information contained herein should be left to medically-trained personnel. Specific patient instructions are provided elsewhere under Patient Instructions section of medical record. This document was created in part using AI and STT-dictation technology, any transcriptional errors that may result from this process are unintentional.  Patient: Jose Hayes  Service: E/M   PCP: Abbey Bruckner, MD  DOB: 1961-03-04  DOS: 12/22/2023  Provider: Wallie Sherry, MD  MRN: [id number]  Delivery: Face-to-face  Specialty: Interventional Pain Management  Type: Established Patient  Setting: Ambulatory outpatient facility  Specialty designation: 09  Referring Prov.: Bair, Bruckner, MD  Location: Outpatient office facility       History of present illness (HPI) Jose Hayes, a 63 y.o. year old male, is here today because of his Lumbar radiculopathy [M54.16]. Jose Hayes primary complain today is low back and left leg pain   Pain Assessment: Severity of Chronic pain is reported as a 3 /10. Location: Back Left/down left leg to upper right side of foot. Onset: More than a month ago. Quality: Aching, Constant, Sharp, Numbness, Discomfort. Timing: Constant. Modifying factor(s): rest,. Vitals:  height is 5' 8 (1.727 m) and weight is 240 lb (108.9 kg). His temperature is 98 F (36.7 C). His blood pressure is 132/81 and his pulse is 67. His respiration is 16 and oxygen saturation is 98%.  BMI: Estimated body mass index is 36.49 kg/m as calculated from the following:   Height as of this encounter: 5' 8 (1.727 m).   Weight as of this encounter: 240 lb (108.9 kg).  Last encounter: 11/07/2023. Last procedure: 11/23/2023.  Reason for encounter:  Discussed the use of AI scribe software for clinical note transcription with the patient, who gave verbal consent to proceed.  History of Present Illness   Jose Hayes is a 63 year old male who presents for pain management  following a recent injection.  After receiving an injection, he initially experienced persistent pain on the first day. By the second day, there was noticeable improvement, which continued into the third day. For about a week, he felt almost really good, but then the effects began to diminish. Currently, his pain has settled back to a level that he believes is better than before, although it is difficult for him to assess precisely.  His pain is less noticeable when he is less active for three or four days, but it flares up with activity, though not as severely as before. He has not experienced any incidents like the one at [facility] where his foot 'tore in two'.  He mentions ongoing issues with his right foot, shoulder, and neck, which have been terrible. He is scheduled to see a rheumatologist at the end of the month and is undergoing lab work to investigate further. He currently uses tramadol  as needed for severe pain and has one refill remaining.       Post-Procedure Evaluation   Procedure: Lumbar trans-foraminal epidural steroid injection (L-TFESI) #1  Laterality: Left (-LT)  Level: L5 nerve root(s) Imaging: Fluoroscopy-guided         Anesthesia: Local anesthesia (1-2% Lidocaine ) Sedation: Minimal Sedation                       DOS: 11/23/2023  Performed by: Wallie Sherry, MD  Purpose: Diagnostic/Therapeutic Indications: Lumbar radicular pain severe enough to impact quality of life or function. 1. Lumbar radiculopathy   2. Chronic radicular lumbar pain    NAS-11 Pain score:  Pre-procedure: 4 /10   Post-procedure: 5 /10     Effectiveness:  Initial hour after procedure: 100 %  Subsequent 4-6 hours post-procedure: 80 %  Analgesia past initial 6 hours: 80% for 1 week Ongoing improvement:  Analgesic:  50%      UDS:  Summary  Date Value Ref Range Status  10/13/2023 FINAL  Final    Comment:    ==================================================================== Compliance  Drug Analysis, Ur ==================================================================== Test                             Result       Flag       Units  Drug Present and Declared for Prescription Verification   Gabapentin                      PRESENT      EXPECTED   Sertraline                     PRESENT      EXPECTED   Desmethylsertraline            PRESENT      EXPECTED    Desmethylsertraline is an expected metabolite of sertraline.  Drug Absent but Declared for Prescription Verification   Alprazolam                      Not Detected UNEXPECTED ng/mg creat   Tramadol                        Not Detected UNEXPECTED ng/mg creat ==================================================================== Test                      Result    Flag   Units      Ref Range   Creatinine              146              mg/dL      >=79 ==================================================================== Declared Medications:  The flagging and interpretation on this report are based on the  following declared medications.  Unexpected results may arise from  inaccuracies in the declared medications.   **Note: The testing scope of this panel includes these medications:   Alprazolam  (Xanax )  Gabapentin  (Neurontin )  Sertraline (Zoloft)  Tramadol  (Ultram )   **Note: The testing scope of this panel does not include the  following reported medications:   Albuterol  (Ventolin  HFA)  Amlodipine  (Norvasc )  Calcium   Cholecalciferol  Esomeprazole  (Nexium )  Hydrocortisone  (Cortef )  Levothyroxine  (Synthroid )  Liothyronine (Cytomel)  Rosuvastatin  (Crestor )  Testosterone  ==================================================================== For clinical consultation, please call [phone]. ====================================================================     No results found for: CBDTHCR No results found for: D8THCCBX No results found for: D9THCCBX  ROS  Constitutional: Denies any fever or  chills Gastrointestinal: No reported hemesis, hematochezia, vomiting, or acute GI distress Musculoskeletal: Denies any acute onset joint swelling, redness, loss of ROM, or weakness Neurological: No reported episodes of acute onset apraxia, aphasia, dysarthria, agnosia, amnesia, paralysis, loss of coordination, or loss of consciousness  Medication Review  ALPRAZolam , albuterol , amLODipine , budesonide -formoterol , calcium -vitamin D, esomeprazole , fluticasone , gabapentin , hydrocortisone , hydrocortisone  sodium succinate , levothyroxine , liothyronine, rosuvastatin , sertraline, and testosterone  cypionate  History Review  Allergy: Jose Hayes has no known allergies. Drug: Jose Hayes  reports no history of drug use. Alcohol:  reports that he does not currently use alcohol. Tobacco:  reports that he quit smoking about 14 years ago. His smoking use included cigarettes. He started smoking about 26 years ago. He has a 12 pack-year smoking history. He has never used smokeless tobacco. Social: Jose Hayes  reports that he quit smoking about 14 years ago. His smoking use included cigarettes. He started smoking about 26 years ago. He has a 12 pack-year smoking history. He has never used smokeless tobacco. He reports that he does not currently use alcohol. He reports that he does not use drugs. Medical:  has a past medical history of Adrenal insufficiency (Addison's disease) (HCC), Allergy, AMS (altered mental status) (05/02/2022), Anxiety, Basal cell carcinoma, COVID-19, Esophageal cancer (HCC) (10/21/2016), Fall (05/30/2023), GERD (gastroesophageal reflux disease), Hyperglycemia, Hyperlipidemia, Hypertension, Left acoustic neuroma (HCC), Severe left groin pain (12/28/2022), Sinus bradycardia (12/30/2022), Sleep apnea, SVT (supraventricular tachycardia) (HCC), Thyroid  disease, and Tobacco abuse. Surgical: Jose Hayes  has a past surgical history that includes Shoulder surgery (Right, 1985, 1991, 2000);  Laparoscopic cholecystectomy (1991); Colonoscopy; Upper gastrointestinal endoscopy; swidom teeth extraction; Knee arthroscopy (Right, 07/30/2021); and Intercostal nerve block. Family: family history includes Clotting disorder in his father; Hypertension in his father and mother; Pancreatic cancer in his father.  Laboratory Chemistry Profile   Renal Lab Results  Component Value Date   BUN 19 12/13/2023   CREATININE 1.11 12/13/2023   BCR NOT APPLICABLE 12/19/2020   GFR 70.91 12/13/2023   GFRAA >60 11/06/2011   GFRNONAA >60 05/04/2022    Hepatic Lab Results  Component Value Date   AST 22 12/13/2023   ALT 21 12/13/2023   ALBUMIN 4.7 12/13/2023   ALKPHOS 52 12/13/2023   LIPASE 15.0 10/13/2016    Electrolytes Lab Results  Component Value Date   NA 140 12/13/2023   K 4.5 12/13/2023   CL 100 12/13/2023   CALCIUM  9.3 12/13/2023   MG 2.8 (H) 05/04/2022    Bone No results found for: VD25OH, CI874NY7UNU, CI6874NY7, CI7874NY7, 25OHVITD1, 25OHVITD2, 25OHVITD3, TESTOFREE, TESTOSTERONE   Inflammation (CRP: Acute Phase) (ESR: Chronic Phase) Lab Results  Component Value Date   ESRSEDRATE 13 05/03/2022   LATICACIDVEN 1.5 05/03/2022         Note: Above Lab results reviewed.  Recent Imaging Review  DG PAIN CLINIC C-ARM 1-60 MIN NO REPORT Fluoro was used, but no Radiologist interpretation will be provided.  Please refer to NOTES tab for provider progress note. Note: Reviewed        Physical Exam  Vitals: BP 132/81   Pulse 67   Temp 98 F (36.7 C)   Resp 16   Ht 5' 8 (1.727 m)   Wt 240 lb (108.9 kg)   SpO2 98%   BMI 36.49 kg/m  BMI: Estimated body mass index is 36.49 kg/m as calculated from the following:   Height as of this encounter: 5' 8 (1.727 m).   Weight as of this encounter: 240 lb (108.9 kg). Ideal: Ideal body weight: 68.4 kg (150 lb 12.7 oz) Adjusted ideal body weight: 84.6 kg (186 lb 7.6 oz) General appearance: Well nourished, well developed,  and well hydrated. In no apparent acute distress Mental status: Alert, oriented x 3 (person, place, & time)       Respiratory: No evidence of acute respiratory distress Eyes: PERLA   Assessment   Diagnosis Status  1. Lumbar radiculopathy   2. Chronic radicular lumbar pain   3. Degeneration of intervertebral disc of lumbar region with discogenic back pain and lower extremity pain   4. Chemotherapy-induced neuropathy (HCC)  5. Chronic bilateral low back pain with left-sided sciatica   6. Chronic pain syndrome    Controlled Controlled Controlled   Updated Problems: Problem  Chronic Radicular Lumbar Pain    Plan of Care  Assessment and Plan    Chronic lumbar radicular pain Chronic right foot pain has improved post-lumbar epidural steroid injection, providing significant relief for a week. Symptoms have returned but are less severe and are exacerbated by activity. No recent acute incidents. Consider a repeat injection in October if symptoms worsen, while avoiding frequent steroid use to minimize risks. Continue tramadol  as needed for severe pain, with one refill remaining.  Chronic pain syndrome   Chronic pain syndrome involves the right foot, shoulder, and neck, possibly related to previous immunotherapy. He is under rheumatological evaluation for potential polyarthritis or other conditions. Follow up with the rheumatologist at the end of the month for further evaluation and lab results. Consider a repeat injection if pain worsens.       Return for patient will call to schedule F2F appt prn.    Recent Visits Date Type Provider Dept  11/23/23 Procedure visit Marcelino Nurse, MD [facility]  11/07/23 Office Visit Marcelino Nurse, MD [facility]  10/13/23 Office Visit Marcelino Nurse, MD [facility]  Showing recent visits within past 90 days and meeting all other requirements Today's Visits Date Type Provider Dept  12/22/23 Office Visit Marcelino Nurse, MD  [facility]  Showing today's visits and meeting all other requirements Future Appointments No visits were found meeting these conditions. Showing future appointments within next 90 days and meeting all other requirements  I discussed the assessment and treatment plan with the patient. The patient was provided an opportunity to ask questions and all were answered. The patient agreed with the plan and demonstrated an understanding of the instructions.  Patient advised to call back or seek an in-person evaluation if the symptoms or condition worsens.  Duration of encounter: .  Total time on encounter, as per AMA guidelines included both the face-to-face and non-face-to-face time personally spent by the physician and/or other qualified health care professional(s) on the day of the encounter (includes time in activities that require the physician or other qualified health care professional and does not include time in activities normally performed by clinical staff). Physician's time may include the following activities when performed: Preparing to see the patient (e.g., pre-charting review of records, searching for previously ordered imaging, lab work, and nerve conduction tests) Review of prior analgesic pharmacotherapies. Reviewing PMP Interpreting ordered tests (e.g., lab work, imaging, nerve conduction tests) Performing post-procedure evaluations, including interpretation of diagnostic procedures Obtaining and/or reviewing separately obtained history Performing a medically appropriate examination and/or evaluation Counseling and educating the patient/family/caregiver Ordering medications, tests, or procedures Referring and communicating with other health care professionals (when not separately reported) Documenting clinical information in the electronic or other health record Independently interpreting results (not separately reported) and communicating results to the  patient/ family/caregiver Care coordination (not separately reported)  Note by: Nurse Marcelino, MD (TTS and AI technology used. I apologize for any typographical errors that were not detected and corrected.) Date: 12/22/2023; Time: 12:15 PM

## 2023-12-27 DIAGNOSIS — M2550 Pain in unspecified joint: Secondary | ICD-10-CM | POA: Diagnosis not present

## 2023-12-27 DIAGNOSIS — E291 Testicular hypofunction: Secondary | ICD-10-CM | POA: Diagnosis not present

## 2023-12-27 DIAGNOSIS — Z9289 Personal history of other medical treatment: Secondary | ICD-10-CM | POA: Diagnosis not present

## 2023-12-27 DIAGNOSIS — T45AX5D Adverse effect of immune checkpoint inhibitors and immunostimulant drugs, subsequent encounter: Secondary | ICD-10-CM | POA: Diagnosis not present

## 2024-01-02 DIAGNOSIS — M2559 Pain in other specified joint: Secondary | ICD-10-CM | POA: Diagnosis not present

## 2024-01-02 DIAGNOSIS — T45AX5A Adverse effect of immune checkpoint inhibitors and immunostimulant drugs, initial encounter: Secondary | ICD-10-CM | POA: Diagnosis not present

## 2024-01-02 DIAGNOSIS — E559 Vitamin D deficiency, unspecified: Secondary | ICD-10-CM | POA: Diagnosis not present

## 2024-01-02 DIAGNOSIS — M2550 Pain in unspecified joint: Secondary | ICD-10-CM | POA: Diagnosis not present

## 2024-01-03 DIAGNOSIS — Z1330 Encounter for screening examination for mental health and behavioral disorders, unspecified: Secondary | ICD-10-CM | POA: Diagnosis not present

## 2024-01-03 DIAGNOSIS — F411 Generalized anxiety disorder: Secondary | ICD-10-CM | POA: Diagnosis not present

## 2024-01-03 DIAGNOSIS — Z1331 Encounter for screening for depression: Secondary | ICD-10-CM | POA: Diagnosis not present

## 2024-01-03 DIAGNOSIS — F331 Major depressive disorder, recurrent, moderate: Secondary | ICD-10-CM | POA: Diagnosis not present

## 2024-01-09 DIAGNOSIS — G4733 Obstructive sleep apnea (adult) (pediatric): Secondary | ICD-10-CM | POA: Diagnosis not present

## 2024-01-11 ENCOUNTER — Ambulatory Visit: Admitting: Pulmonary Disease

## 2024-01-11 ENCOUNTER — Encounter: Payer: Self-pay | Admitting: Pulmonary Disease

## 2024-01-11 VITALS — BP 120/86 | HR 65 | Temp 97.8°F | Ht 68.0 in | Wt 237.0 lb

## 2024-01-11 DIAGNOSIS — D1722 Benign lipomatous neoplasm of skin and subcutaneous tissue of left arm: Secondary | ICD-10-CM | POA: Diagnosis not present

## 2024-01-11 DIAGNOSIS — J4540 Moderate persistent asthma, uncomplicated: Secondary | ICD-10-CM | POA: Diagnosis not present

## 2024-01-11 DIAGNOSIS — R058 Other specified cough: Secondary | ICD-10-CM

## 2024-01-11 DIAGNOSIS — D2262 Melanocytic nevi of left upper limb, including shoulder: Secondary | ICD-10-CM | POA: Diagnosis not present

## 2024-01-11 DIAGNOSIS — D2271 Melanocytic nevi of right lower limb, including hip: Secondary | ICD-10-CM | POA: Diagnosis not present

## 2024-01-11 DIAGNOSIS — D171 Benign lipomatous neoplasm of skin and subcutaneous tissue of trunk: Secondary | ICD-10-CM | POA: Diagnosis not present

## 2024-01-11 DIAGNOSIS — D2272 Melanocytic nevi of left lower limb, including hip: Secondary | ICD-10-CM | POA: Diagnosis not present

## 2024-01-11 DIAGNOSIS — L918 Other hypertrophic disorders of the skin: Secondary | ICD-10-CM | POA: Diagnosis not present

## 2024-01-11 DIAGNOSIS — D2261 Melanocytic nevi of right upper limb, including shoulder: Secondary | ICD-10-CM | POA: Diagnosis not present

## 2024-01-11 DIAGNOSIS — J45998 Other asthma: Secondary | ICD-10-CM | POA: Diagnosis not present

## 2024-01-11 DIAGNOSIS — J324 Chronic pansinusitis: Secondary | ICD-10-CM

## 2024-01-11 DIAGNOSIS — D1721 Benign lipomatous neoplasm of skin and subcutaneous tissue of right arm: Secondary | ICD-10-CM | POA: Diagnosis not present

## 2024-01-11 DIAGNOSIS — D225 Melanocytic nevi of trunk: Secondary | ICD-10-CM | POA: Diagnosis not present

## 2024-01-11 LAB — PULMONARY FUNCTION TEST
DL/VA % pred: 91 %
DL/VA: 3.8 ml/min/mmHg/L
DLCO unc % pred: 105 %
DLCO unc: 26.6 ml/min/mmHg
FEF 25-75 Post: 4.63 L/s
FEF 25-75 Pre: 3.98 L/s
FEF2575-%Change-Post: 16 %
FEF2575-%Pred-Post: 175 %
FEF2575-%Pred-Pre: 150 %
FEV1-%Change-Post: 4 %
FEV1-%Pred-Post: 120 %
FEV1-%Pred-Pre: 115 %
FEV1-Post: 3.94 L
FEV1-Pre: 3.76 L
FEV1FVC-%Change-Post: 0 %
FEV1FVC-%Pred-Pre: 109 %
FEV6-%Change-Post: 3 %
FEV6-%Pred-Post: 115 %
FEV6-%Pred-Pre: 111 %
FEV6-Post: 4.75 L
FEV6-Pre: 4.57 L
FEV6FVC-%Change-Post: 0 %
FEV6FVC-%Pred-Post: 105 %
FEV6FVC-%Pred-Pre: 105 %
FVC-%Change-Post: 4 %
FVC-%Pred-Post: 109 %
FVC-%Pred-Pre: 105 %
FVC-Post: 4.76 L
FVC-Pre: 4.57 L
Post FEV1/FVC ratio: 83 %
Post FEV6/FVC ratio: 100 %
Pre FEV1/FVC ratio: 82 %
Pre FEV6/FVC Ratio: 100 %
RV % pred: 135 %
RV: 2.97 L
TLC % pred: 118 %
TLC: 7.81 L

## 2024-01-11 LAB — NITRIC OXIDE: Nitric Oxide: 17

## 2024-01-11 NOTE — Progress Notes (Signed)
 Subjective:    Patient ID: [name], male    DOB: [birth year], 63 y.o.   MRN: [id number]  Patient Care Team: Bair, Kalpana, MD as PCP - General (Family Medicine) Perla Evalene PARAS, MD as PCP - Cardiology (Cardiology) Perla Evalene PARAS, MD as Consulting Physician (Cardiology) Lennie, Darice Reusing, MD as Referring Physician (Urology) Sheilda Lorrayne BRAVO, MD as Referring Physician (Internal Medicine) Lucienne, Ector Lock, MD as Referring Physician (Endocrinology) Levern, Richerd Helling, NP as Nurse Practitioner (Psychiatry) Beatris Lenis, MD (Otolaryngology) Tamea Dedra CROME, MD as Consulting Physician (Pulmonary Disease)  Chief Complaint  Patient presents with   Asthma    Occasional cough with sinus drainage. Shortness of breath on exertion.     BACKGROUND/INTERVAL:Patient is a 63 year old former smoker with a very complex medical history as noted below who presents for follow-up of a persistent cough and recurrent bronchitis in the setting of moderate persistent asthma.  He was initially evaluated here on 08 November 2023.  HPI Discussed the use of AI scribe software for clinical note transcription with the patient, who gave verbal consent to proceed.  History of Present Illness   Jose Hayes is a 63 year old male with asthma who presents with issues related to cough and asthma.  He is currently using Symbicort  daily and has a rescue inhaler available. His lung problems often start with drainage from his sinuses and then move down to his lungs. He is seeing Dr. Nobie Fritter at [facility] for his sinus issues, which he believes will help alleviate his lung problems.  He has a history of stage four gastroesophageal cancer, which is currently in remission.  His nitric oxide  level was previously high at fifty-nine parts per billion but has decreased to seventeen parts per billion.   He had pulmonary function testing performed today which was normal.  This is consistent with his  asthma diagnosis.    DATA 11/08/2023 nitric oxide : 51 ppb 01/11/2024 PFTs: FEV1 3.76 L or 115% predicted, FVC 4.57 L or 105% predicted, FEV1/FVC 82%, lung volumes show some mild hyperinflation and air trapping.  Diffusion capacity normal.  No significant bronchodilator response.  Normal spirometry with some mild hyperinflation and air trapping. 01/11/2024 nitric oxide : 17 ppb  Review of Systems A 10 point review of systems was performed and it is as noted above otherwise negative.   Patient Active Problem List   Diagnosis Date Noted   Chronic radicular lumbar pain 12/22/2023   Adverse effect of immune checkpoint inhibitor 12/02/2023   Polyarthralgia 12/02/2023   History of fainting 09/13/2023   Prediabetes 09/09/2023   Bronchitis 05/30/2023   GERD (gastroesophageal reflux disease) 05/30/2023   Skin lesion 04/25/2023   Lumbar degenerative disc disease 12/28/2022   Status post bilateral inguinal hernia repair 12/28/2022   Chronic pain syndrome 12/28/2022   Lumbar facet arthropathy 12/28/2022   Chemotherapy-induced neuropathy (HCC) 12/28/2022   Chronic bilateral low back pain with left-sided sciatica 11/23/2022   Neuropathy 11/23/2022   Poison ivy 05/24/2022   Complex tear of lateral meniscus of right knee as current injury    Loose body in knee, right knee    Right knee pain 07/01/2021   Pulsatile tinnitus 06/22/2021   Left foot pain 06/22/2021   Inguinal hernia 12/19/2020   Adrenal insufficiency (HCC) 05/30/2020   Hypothyroidism 05/30/2020   Mood disorder (HCC) 05/30/2020   Left acoustic neuroma (HCC) 01/01/2020   Deviated nasal septum 08/07/2019   OSA (obstructive sleep apnea) 08/08/2017   Gastroesophageal cancer (HCC)  11/02/2016   Kidney stones 10/13/2016   Restless leg syndrome 09/04/2015   Hyperlipidemia 12/01/2011   SVT (supraventricular tachycardia) (HCC) 12/01/2011   Obesity, morbid (HCC) 12/01/2011   Hypertension 12/01/2011    Social History   Tobacco Use    Smoking status: Former    Current packs/day: 0.00    Average packs/day: 1 pack/day for 12.0 years (12.0 ttl pk-yrs)    Types: Cigarettes    Start date: 1999    Quit date: 2011    Years since quitting: 14.6   Smokeless tobacco: Never  Substance Use Topics   Alcohol use: Not Currently    Comment: occasionally    No Known Allergies  Current Meds  Medication Sig   albuterol  (VENTOLIN  HFA) 108 (90 Base) MCG/ACT inhaler Inhale 2 puffs into the lungs every 6 (six) hours as needed.   ALPRAZolam  (XANAX ) 1 MG tablet Take 1 mg by mouth 3 (three) times daily as needed for anxiety.   amLODipine  (NORVASC ) 5 MG tablet Take 1 tablet (5 mg total) by mouth 2 (two) times daily.   budesonide -formoterol  (SYMBICORT ) 160-4.5 MCG/ACT inhaler Inhale 2 puffs into the lungs 2 (two) times daily.   calcium -vitamin D (OSCAL WITH D) 500-5 MG-MCG tablet Take 1 tablet by mouth.   esomeprazole  (NEXIUM ) 40 MG capsule Take 40 mg by mouth in the morning and at bedtime.   fluticasone  (FLONASE ) 50 MCG/ACT nasal spray Place into both nostrils daily.   gabapentin  (NEURONTIN ) 100 MG capsule Take 1 capsule by mouth 2 (two) times daily. 100 @ noon, 100 @ 1800, 300 at bedtime   gabapentin  (NEURONTIN ) 300 MG capsule Take 1 capsule (300 mg total) by mouth at bedtime for 15 days, THEN 1 capsule (300 mg total) 2 (two) times daily.   hydrocortisone  (CORTEF ) 10 MG tablet Take 10-15 mg by mouth See admin instructions. Take 1.5 tablets (15 mg) by mouth in the morning (1 hour after levothyroxine ) & take 1 tablet (10 mg) by mouth in the afternoon at 2 pm.   hydrocortisone  sodium succinate  (SOLU-CORTEF ) 100 MG injection Inject 2 mLs (100 mg total) into the muscle as needed. For adrenal crisis.   levothyroxine  (SYNTHROID ) 75 MCG tablet Take 75 mcg by mouth daily before breakfast.   liothyronine (CYTOMEL) 5 MCG tablet Take 5 mcg by mouth 2 (two) times daily.   rosuvastatin  (CRESTOR ) 20 MG tablet Take 1 tablet (20 mg total) by mouth daily.    sertraline (ZOLOFT) 100 MG tablet Take 150 mg by mouth daily.   testosterone  cypionate (DEPOTESTOSTERONE CYPIONATE) 200 MG/ML injection Inject into the muscle every Monday. 0.37mls    Immunization History  Administered Date(s) Administered   Influenza, Seasonal, Injecte, Preservative Fre 04/25/2023   Influenza,inj,Quad PF,6+ Mos 02/03/2017, 02/13/2018, 01/11/2019   Influenza-Unspecified 04/09/2020   PFIZER(Purple Top)SARS-COV-2 Vaccination 07/30/2019, 08/06/2019, 08/27/2019   Tdap 03/16/2021   Tetanus 03/16/2021        Objective:     BP 120/86   Pulse 65   Temp 97.8 F (36.6 C) (Temporal)   Ht 5' 8 (1.727 m)   Wt 237 lb (107.5 kg)   SpO2 95%   BMI 36.04 kg/m   SpO2: 95 %  GENERAL: Obese, well-developed gentleman, no acute distress.  No conversational dyspnea.  No hoarseness. HEAD: Normocephalic, atraumatic.  EYES: Pupils equal, round, reactive to light.  No scleral icterus.  MOUTH: Dentition intact, oral mucosa moist.  No thrush. NECK: Supple. No thyromegaly. Trachea midline. No JVD.  No adenopathy. PULMONARY: Good air entry bilaterally.  No adventitious sounds. CARDIOVASCULAR: S1 and S2. Regular rate and rhythm.  No rubs, murmurs or gallops heard. ABDOMEN: Obese, otherwise benign. MUSCULOSKELETAL: No joint deformity, no clubbing, no edema.  NEUROLOGIC: No overt focal deficit, no gait disturbance, speech is fluent. SKIN: Intact,warm,dry. PSYCH: Mood and behavior normal.  Lab Results  Component Value Date   NITRICOXIDE 17 01/11/2024         Assessment & Plan:     ICD-10-CM   1. Moderate persistent asthma without complication  J45.40     2. Other cough  R05.8 Nitric oxide     3. Chronic pansinusitis  J32.4       Orders Placed This Encounter  Procedures   Nitric oxide    Discussion:    Asthma Asthma is well-controlled with a nitric oxide  level of 17 parts per billion, reduced from 51, indicating decreased airway inflammation. Symptoms are primarily  triggered by sinus issues. Symbicort  is effective in managing symptoms. - Continue Symbicort  twice daily. - Ensure Symbicort  refills are available for one year. - Continue use of as needed albuterol . - Schedule follow-up appointment in four months.     Chronic pansinusitis Follow-up with ENT, patient to have surgical intervention for same.   Advised if symptoms do not improve or worsen, to please contact office for sooner follow up or seek emergency care.    I spent 32 minutes of dedicated to the care of this patient on the date of this encounter to include pre-visit review of records, face-to-face time with the patient discussing conditions above, post visit ordering of testing, clinical documentation with the electronic health record, making appropriate referrals as documented, and communicating necessary findings to members of the patients care team.     C. Leita Sanders, MD [facility]    *This note was generated using voice recognition software/Dragon and/or AI transcription program.  Despite best efforts to proofread, errors can occur which can change the meaning. Any transcriptional errors that result from this process are unintentional and may not be fully corrected at the time of dictation.

## 2024-01-11 NOTE — Progress Notes (Signed)
 Full PFT completed today ? ?

## 2024-01-11 NOTE — Patient Instructions (Signed)
 VISIT SUMMARY:  You visited today to discuss your asthma and related cough issues. Your asthma appears to be well-controlled, and your nitric oxide  levels have improved significantly. You are also seeing Dr. Nobie Fritter for your sinus issues, which seem to be the primary trigger for your lung problems.  YOUR PLAN:  -ASTHMA: Asthma is a condition where your airways become inflamed and narrow, making it hard to breathe. Your asthma is well-controlled, as indicated by your improved nitric oxide  levels. Continue using Symbicort  daily, and make sure you have enough refills for the next year. We will schedule a follow-up appointment in four months.  INSTRUCTIONS:  Please schedule a follow-up appointment in four months to monitor your asthma and ensure your symptoms remain well-controlled.

## 2024-01-11 NOTE — Patient Instructions (Signed)
 Full PFT completed today ? ?

## 2024-01-12 ENCOUNTER — Telehealth

## 2024-01-12 ENCOUNTER — Ambulatory Visit: Payer: Self-pay

## 2024-01-12 ENCOUNTER — Ambulatory Visit

## 2024-01-12 DIAGNOSIS — U071 COVID-19: Secondary | ICD-10-CM

## 2024-01-12 MED ORDER — NIRMATRELVIR/RITONAVIR (PAXLOVID)TABLET: 3.0000 | ORAL_TABLET | Freq: Two times a day (BID) | ORAL | 0 refills | Status: AC

## 2024-01-12 NOTE — Assessment & Plan Note (Signed)
 Patient with 1 day hx of fever, sore throat, congestion, and myalgias in setting of recent COVID exposure. Patient's wife tested positive for COVID yesterday. Given patient is high risk for complications, will presumptively treat with Paxlovid . - Reviewed medication list in detail to evaluate for medication interactions. Patient will HOLD his statin and amlodipine . Will decrease Symbicort  to 1x daily. - Discussed medication side effects - Return precautions given

## 2024-01-12 NOTE — Telephone Encounter (Signed)
 FYI Only or Action Required?: Action required by provider: clinical question for provider. Pt asking if he should take antiviral given that he is high risk.   Patient was last seen in primary care on 12/15/2023 by Bair, Kalpana, MD.  Called Nurse Triage reporting Covid Exposure.  Symptoms began yesterday.  Interventions attempted: Nothing.  Symptoms are: gradually worsening.  Triage Disposition: Call PCP Within 24 Hours  Patient/caregiver understands and will follow disposition?: yes  Copied from CRM #[id number]. Topic: General - Other >> Jan 12, 2024 11:00 AM Kathrin PARAS wrote: Reason for CRM: patient is concerned becasue he may have covid and wants to know the best course of action since he is high risk due to previous cancer. He has a sore throat and his head is stopped up and balance if off Reason for Disposition  [1] HIGH RISK patient (e.g., weak immune system, age > 64 years, obesity with BMI 30 or higher, pregnant, chronic lung disease or other chronic medical condition) AND [2] COVID symptoms (e.g., cough, fever)  (Exceptions: Already seen by PCP and no new or worsening symptoms.)  Answer Assessment - Initial Assessment Questions 1. COVID-19 EXPOSURE: Please describe how you were exposed to someone with a COVID-19 infection.     Wife is covid positive 2. PLACE of CONTACT: Where were you when you were exposed to COVID-19? (e.g., home, school, medical waiting room; which city?)     home 3. TYPE of CONTACT: How much contact was there? (e.g., sitting next to, live in same house, work in same office, same building)     Live in same house 4. DURATION of CONTACT: How long were you in contact with the COVID-19 patient? (e.g., a few seconds, passed by person, a few minutes, 15 minutes or longer, live with the patient)     Lives with  8. SYMPTOMS: Do you have any symptoms? (e.g., fever, cough, breathing difficulty, loss of taste or smell)     Sore throat, cough,  9. VACCINE: Have  you gotten the COVID-19 vaccine? If Yes, ask: Which one, how many shots, when did you get it?     Did not get covid vaccine 11. HIGH RISK: Do you have any heart or lung problems? (e.g., asthma, COPD, heart failure) Do you have a weak immune system or other risk factors? (e.g., HIV positive, chemotherapy, renal failure, diabetes mellitus, sickle cell anemia, obesity)       Hx of cancer  Protocols used: Coronavirus (COVID-19) Exposure-A-AH, Coronavirus (COVID-19) Diagnosed or Suspected-A-AH

## 2024-01-12 NOTE — Progress Notes (Signed)
    MyChart Video Visit    Virtual Visit via Video Note   This format is felt to be most appropriate for this patient at this time. Physical exam was limited by quality of the video and audio technology used for the visit.    Patient location: home Provider location: [facility] Persons involved in the visit: patient, provider  I discussed the limitations of evaluation and management by telemedicine and the availability of in person appointments. The patient expressed understanding and agreed to proceed.  Patient: Jose Hayes   DOB: [birth year]   63 y.o. Male  MRN: [id number] Visit Date: 01/12/2024  Today's healthcare provider: Isaiah DELENA Pepper, MD   CC: Concern for COVID  Subjective     HPI  - wife tested positive for covid yesterday - started feeling bad today  - woke up this morning with a fever, sore throat, congestion, myalgias - Denies shortness of breath, chills - no recent COVID vaccine - previously required monoclonal antibody treatment for COVID  Review of systems as noted in HPI.      Objective      Physical Exam HENT:     Head: Normocephalic.  Pulmonary:     Effort: Pulmonary effort is normal.  Neurological:     Mental Status: He is alert.  Psychiatric:        Mood and Affect: Mood normal.       Assessment & Plan     Problem List Items Addressed This Visit       Other   COVID-19 - Primary   Patient with 1 day hx of fever, sore throat, congestion, and myalgias in setting of recent COVID exposure. Patient's wife tested positive for COVID yesterday. Given patient is high risk for complications, will presumptively treat with Paxlovid . - Reviewed medication list in detail to evaluate for medication interactions. Patient will HOLD his statin and amlodipine . Will decrease Symbicort  to 1x daily. - Discussed medication side effects - Return precautions given      Relevant Medications   nirmatrelvir /ritonavir  (PAXLOVID ) 20 x  150 MG & 10 x 100MG  TABS    Meds ordered this encounter  Medications   nirmatrelvir /ritonavir  (PAXLOVID ) 20 x 150 MG & 10 x 100MG  TABS    Sig: Take 3 tablets by mouth 2 (two) times daily for 5 days. (Take nirmatrelvir  150 mg two tablets twice daily for 5 days and ritonavir  100 mg one tablet twice daily for 5 days)    Dispense:  30 tablet    Refill:  0     No follow-ups on file.     I discussed the assessment and treatment plan with the patient. The patient was provided an opportunity to ask questions and all were answered. The patient agreed with the plan and demonstrated an understanding of the instructions.   The patient was advised to call back or seek an in-person evaluation if the symptoms worsen or if the condition fails to improve as anticipated.  Isaiah DELENA Pepper, MD [facility] [phone] (phone) [phone] (fax)

## 2024-01-12 NOTE — Telephone Encounter (Signed)
 Noted

## 2024-01-14 ENCOUNTER — Ambulatory Visit
Admission: RE | Admit: 2024-01-14 | Discharge: 2024-01-14 | Disposition: A | Discharge status: Home / Self Care | Source: Ambulatory Visit | Attending: Family Medicine | Admitting: Family Medicine

## 2024-01-14 ENCOUNTER — Other Ambulatory Visit: Payer: Self-pay

## 2024-01-14 VITALS — BP 121/82 | HR 69 | Temp 98.1°F | Resp 20

## 2024-01-14 DIAGNOSIS — J4541 Moderate persistent asthma with (acute) exacerbation: Secondary | ICD-10-CM | POA: Diagnosis not present

## 2024-01-14 DIAGNOSIS — U071 COVID-19: Secondary | ICD-10-CM | POA: Diagnosis not present

## 2024-01-14 LAB — POC SOFIA SARS ANTIGEN FIA: SARS Coronavirus 2 Ag: POSITIVE — AB

## 2024-01-14 MED ORDER — PAXLOVID (300/100) 20 X 150 MG & 10 X 100MG PO TBPK: 3.0000 | ORAL_TABLET | Freq: Two times a day (BID) | ORAL | 0 refills | Status: AC

## 2024-01-14 MED ORDER — AZELASTINE HCL 0.1 % NA SOLN: 1.0000 | Freq: Two times a day (BID) | NASAL | 0 refills | Status: AC

## 2024-01-14 NOTE — ED Triage Notes (Signed)
 Pt reports spouse had covid earlier this week.reports fever, cough, sore throat since Thursday. Home covid test negative on Thursday.

## 2024-01-14 NOTE — Discharge Instructions (Signed)
 I have resent the Paxlovid  to a local pharmacy so that if you choose to pick it up this weekend you can.  I have also sent in some nasal spray to help with your symptoms.  Use your Symbicort  consistently, albuterol  every 4 hours as needed and you may continue over-the-counter cold and congestion medications as needed.  Follow-up for worsening or unresolving symptoms.

## 2024-01-14 NOTE — ED Provider Notes (Signed)
 [facility]    CSN: [id number] Arrival date & time: 01/14/24  1302      History   Chief Complaint Chief Complaint  Patient presents with   Wheezing    Entered by patient    HPI Jose Hayes is a 63 y.o. male.   Patient presenting today with 2-day history of fever, cough, body aches, chills, sore throat, congestion, and wheezing and chest tightness.  Denies chest pain, shortness of breath, abdominal pain, vomiting, diarrhea.  So far trying DayQuil, NyQuil and using home inhaler regimen of Symbicort  and albuterol  as needed.  Home COVID test on Thursday was negative, wife tested positive for COVID earlier last week.  PCP went ahead and send in Paxlovid  but patient states the prescription was over $300 so he did not yet pick it up.    Past Medical History:  Diagnosis Date   Adrenal insufficiency (Addison's disease) (HCC)    Allergy    AMS (altered mental status) 05/02/2022   Anxiety    Basal cell carcinoma    nasal   COVID-19    1/14, 05/24/20   Esophageal cancer (HCC) 10/21/2016   GE junction   Fall 05/30/2023   GERD (gastroesophageal reflux disease)    Hyperglycemia    Hyperlipidemia    Hypertension    Left acoustic neuroma (HCC)    Severe left groin pain 12/28/2022   Sinus bradycardia 12/30/2022   Sleep apnea    CPAP   SVT (supraventricular tachycardia) (HCC)    2013   Thyroid  disease    Immunotherapy induced around 2019   Tobacco abuse    Stoped around 2005, smoked for about 15 years, 1/2 pack per day    Patient Active Problem List   Diagnosis Date Noted   COVID-19 01/12/2024   Chronic radicular lumbar pain 12/22/2023   Adverse effect of immune checkpoint inhibitor 12/02/2023   Polyarthralgia 12/02/2023   History of fainting 09/13/2023   Prediabetes 09/09/2023   Bronchitis 05/30/2023   GERD (gastroesophageal reflux disease) 05/30/2023   Skin lesion 04/25/2023   Lumbar degenerative disc disease 12/28/2022   Status post bilateral  inguinal hernia repair 12/28/2022   Chronic pain syndrome 12/28/2022   Lumbar facet arthropathy 12/28/2022   Chemotherapy-induced neuropathy (HCC) 12/28/2022   Chronic bilateral low back pain with left-sided sciatica 11/23/2022   Neuropathy 11/23/2022   Poison ivy 05/24/2022   Complex tear of lateral meniscus of right knee as current injury    Loose body in knee, right knee    Right knee pain 07/01/2021   Pulsatile tinnitus 06/22/2021   Left foot pain 06/22/2021   Inguinal hernia 12/19/2020   Adrenal insufficiency (HCC) 05/30/2020   Hypothyroidism 05/30/2020   Mood disorder (HCC) 05/30/2020   Left acoustic neuroma (HCC) 01/01/2020   Deviated nasal septum 08/07/2019   OSA (obstructive sleep apnea) 08/08/2017   Gastroesophageal cancer (HCC) 11/02/2016   Kidney stones 10/13/2016   Restless leg syndrome 09/04/2015   Hyperlipidemia 12/01/2011   SVT (supraventricular tachycardia) (HCC) 12/01/2011   Obesity, morbid (HCC) 12/01/2011   Hypertension 12/01/2011    Past Surgical History:  Procedure Laterality Date   COLONOSCOPY     INTERCOSTAL NERVE BLOCK     KNEE ARTHROSCOPY Right 07/30/2021   Procedure: right knee arthroscopy, debridement, removal loose body;  Surgeon: Addie Cordella Hamilton, MD;  Location: [facility] OR;  Service: Orthopedics;  Laterality: Right;   LAPAROSCOPIC CHOLECYSTECTOMY  1991   SHOULDER SURGERY Right 1985, 1991, 2000   left x3  [place] teeth extraction     UPPER GASTROINTESTINAL ENDOSCOPY         Home Medications    Prior to Admission medications   Medication Sig Start Date End Date Taking? Authorizing Provider  azelastine  (ASTELIN ) 0.1 % nasal spray Place 1 spray into both nostrils 2 (two) times daily. Use in each nostril as directed 01/14/24  Yes Stuart Vernell Norris, PA-C  nirmatrelvir /ritonavir  (PAXLOVID , 300/100,) 20 x 150 MG & 10 x 100MG  TBPK Take 3 tablets by mouth 2 (two) times daily for 5 days. Patient GFR is >60. Take nirmatrelvir  (150 mg) two tablets  twice daily for 5 days and ritonavir  (100 mg) one tablet twice daily for 5 days. 01/14/24 01/19/24 Yes Stuart Vernell Norris, PA-C  albuterol  (VENTOLIN  HFA) 108 [phone] Base) MCG/ACT inhaler Inhale 2 puffs into the lungs every 6 (six) hours as needed. 11/08/23   Tamea Dedra CROME, MD  ALPRAZolam  (XANAX ) 1 MG tablet Take 1 mg by mouth 3 (three) times daily as needed for anxiety.    [provider]  amLODipine  (NORVASC ) 5 MG tablet Take 1 tablet (5 mg total) by mouth 2 (two) times daily. 11/29/22   Gollan, Timothy J, MD  budesonide -formoterol  (SYMBICORT ) 160-4.5 MCG/ACT inhaler Inhale 2 puffs into the lungs 2 (two) times daily. 11/08/23   Tamea Dedra CROME, MD  calcium -vitamin D (OSCAL WITH D) 500-5 MG-MCG tablet Take 1 tablet by mouth.    [provider]  esomeprazole  (NEXIUM ) 40 MG capsule Take 40 mg by mouth in the morning and at bedtime.    [provider]  fluticasone  (FLONASE ) 50 MCG/ACT nasal spray Place into both nostrils daily.    [provider]  gabapentin  (NEURONTIN ) 100 MG capsule Take 1 capsule by mouth 2 (two) times daily. 100 @ noon, 100 @ 1800, 300 at bedtime 09/29/23 09/28/24  [provider]  gabapentin  (NEURONTIN ) 300 MG capsule Take 1 capsule (300 mg total) by mouth at bedtime for 15 days, THEN 1 capsule (300 mg total) 2 (two) times daily. 12/28/22 01/11/24  Marcelino Nurse, MD  hydrocortisone  (CORTEF ) 10 MG tablet Take 10-15 mg by mouth See admin instructions. Take 1.5 tablets (15 mg) by mouth in the morning (1 hour after levothyroxine ) & take 1 tablet (10 mg) by mouth in the afternoon at 2 pm.    [provider]  hydrocortisone  sodium succinate  (SOLU-CORTEF ) 100 MG injection Inject 2 mLs (100 mg total) into the muscle as needed. For adrenal crisis. 05/04/22   Awanda City, MD  levothyroxine  (SYNTHROID ) 75 MCG tablet Take 75 mcg by mouth daily before breakfast.    [provider]  liothyronine (CYTOMEL) 5 MCG tablet Take 5 mcg by mouth 2  (two) times daily.    [provider]  nirmatrelvir /ritonavir  (PAXLOVID ) 20 x 150 MG & 10 x 100MG  TABS Take 3 tablets by mouth 2 (two) times daily for 5 days. (Take nirmatrelvir  150 mg two tablets twice daily for 5 days and ritonavir  100 mg one tablet twice daily for 5 days) 01/12/24 01/17/24  Franchot Isaiah LABOR, MD  rosuvastatin  (CRESTOR ) 20 MG tablet Take 1 tablet (20 mg total) by mouth daily. 12/15/23   Bair, Kalpana, MD  sertraline (ZOLOFT) 100 MG tablet Take 150 mg by mouth daily.    [provider]  testosterone  cypionate (DEPOTESTOSTERONE CYPIONATE) 200 MG/ML injection Inject into the muscle every Monday. 0.52mls 05/13/21   [provider]    Family History Family History  Problem Relation Age of Onset  Hypertension Mother    Hypertension Father    Pancreatic cancer Father    Clotting disorder Father        renal cell ca   Colon cancer Neg Hx    Esophageal cancer Neg Hx    Prostate cancer Neg Hx    Rectal cancer Neg Hx    Stomach cancer Neg Hx    Colon polyps Neg Hx     Social History Social History   Tobacco Use   Smoking status: Former    Current packs/day: 0.00    Average packs/day: 1 pack/day for 12.0 years (12.0 ttl pk-yrs)    Types: Cigarettes    Start date: 56    Quit date: 2011    Years since quitting: 14.6   Smokeless tobacco: Never  Vaping Use   Vaping status: Former  Substance Use Topics   Alcohol use: Not Currently    Comment: occasionally   Drug use: No     Allergies   Patient has no known allergies.   Review of Systems Review of Systems PER HPI  Physical Exam Triage Vital Signs ED Triage Vitals  Encounter Vitals Group     BP 01/14/24 1355 121/82     Girls Systolic BP Percentile --      Girls Diastolic BP Percentile --      Boys Systolic BP Percentile --      Boys Diastolic BP Percentile --      Pulse Rate 01/14/24 1355 69     Resp 01/14/24 1355 20     Temp 01/14/24 1355 98.1 F (36.7 C)     Temp Source 01/14/24  1355 Oral     SpO2 01/14/24 1355 94 %     Weight --      Height --      Head Circumference --      Peak Flow --      Pain Score 01/14/24 1351 0     Pain Loc --      Pain Education --      Exclude from Growth Chart --    No data found.  Updated Vital Signs BP 121/82 (BP Location: Right Arm)   Pulse 69   Temp 98.1 F (36.7 C) (Oral)   Resp 20   SpO2 94%   Visual Acuity Right Eye Distance:   Left Eye Distance:   Bilateral Distance:    Right Eye Near:   Left Eye Near:    Bilateral Near:     Physical Exam Vitals and nursing note reviewed.  Constitutional:      Appearance: He is well-developed.  HENT:     Head: Atraumatic.     Right Ear: External ear normal.     Left Ear: External ear normal.     Nose: Rhinorrhea present.     Mouth/Throat:     Pharynx: Posterior oropharyngeal erythema present. No oropharyngeal exudate.  Eyes:     Conjunctiva/sclera: Conjunctivae normal.     Pupils: Pupils are equal, round, and reactive to light.  Cardiovascular:     Rate and Rhythm: Normal rate and regular rhythm.  Pulmonary:     Effort: Pulmonary effort is normal. No respiratory distress.     Breath sounds: No wheezing or rales.  Musculoskeletal:        General: Normal range of motion.     Cervical back: Normal range of motion and neck supple.  Lymphadenopathy:     Cervical: No cervical adenopathy.  Skin:    General:  Skin is warm and dry.  Neurological:     Mental Status: He is alert and oriented to person, place, and time.  Psychiatric:        Behavior: Behavior normal.      UC Treatments / Results  Labs (all labs ordered are listed, but only abnormal results are displayed) Labs Reviewed  POC SOFIA SARS ANTIGEN FIA - Abnormal; Notable for the following components:      Result Value   SARS Coronavirus 2 Ag Positive (*)    All other components within normal limits    EKG   Radiology No results found.  Procedures Procedures (including critical care  time)  Medications Ordered in UC Medications - No data to display  Initial Impression / Assessment and Plan / UC Course  I have reviewed the triage vital signs and the nursing notes.  Pertinent labs & imaging results that were available during my care of the patient were reviewed by me and considered in my medical decision making (see chart for details).     Vitals and exam very reassuring today, rapid COVID-positive.  Will resend Paxlovid  as PCPs prescription went to a pharmacy that is not open on the weekends, will also treat with Astelin , supportive over-the-counter medications and home care.  Continue Symbicort  and albuterol  as needed for asthma control.  Return for worsening symptoms.  Final Clinical Impressions(s) / UC Diagnoses   Final diagnoses:  COVID-19  Moderate persistent asthma with acute exacerbation     Discharge Instructions      I have resent the Paxlovid  to a local pharmacy so that if you choose to pick it up this weekend you can.  I have also sent in some nasal spray to help with your symptoms.  Use your Symbicort  consistently, albuterol  every 4 hours as needed and you may continue over-the-counter cold and congestion medications as needed.  Follow-up for worsening or unresolving symptoms.   ED Prescriptions     Medication Sig Dispense Auth. Provider   nirmatrelvir /ritonavir  (PAXLOVID , 300/100,) 20 x 150 MG & 10 x 100MG  TBPK Take 3 tablets by mouth 2 (two) times daily for 5 days. Patient GFR is >60. Take nirmatrelvir  (150 mg) two tablets twice daily for 5 days and ritonavir  (100 mg) one tablet twice daily for 5 days. 30 tablet Stuart Vernell Norris, PA-C   azelastine  (ASTELIN ) 0.1 % nasal spray Place 1 spray into both nostrils 2 (two) times daily. Use in each nostril as directed 30 mL Stuart Vernell Norris, PA-C      PDMP not reviewed this encounter.   Stuart Vernell Norris, [place] 01/14/24 1428

## 2024-01-15 ENCOUNTER — Ambulatory Visit

## 2024-01-26 ENCOUNTER — Other Ambulatory Visit: Payer: Self-pay | Admitting: Cardiovascular Disease

## 2024-01-26 ENCOUNTER — Ambulatory Visit
Admission: RE | Admit: 2024-01-26 | Discharge: 2024-01-26 | Disposition: A | Discharge status: Home / Self Care | Source: Ambulatory Visit | Attending: Nurse Practitioner | Admitting: Nurse Practitioner

## 2024-01-26 VITALS — BP 126/84 | HR 102 | Temp 98.4°F | Resp 18

## 2024-01-26 DIAGNOSIS — J019 Acute sinusitis, unspecified: Secondary | ICD-10-CM

## 2024-01-26 MED ORDER — AMOXICILLIN-POT CLAVULANATE 875-125 MG PO TABS: 1.0000 | ORAL_TABLET | Freq: Two times a day (BID) | ORAL | 0 refills | Status: AC

## 2024-01-26 NOTE — Discharge Instructions (Signed)
 Take medication as directed. Recommend switching from Astelin  to Flonase  while symptoms persist. Increase fluids and get plenty of rest. May take over-the-counter Tylenol  as needed for pain, fever, or general discomfort. Recommend normal saline nasal spray to help with nasal congestion throughout the day. If symptoms fail to improve with this treatment, recommend follow-up with your primary care physician or with ENT for further evaluation. Follow-up as needed.

## 2024-01-26 NOTE — ED Provider Notes (Signed)
 [facility]    CSN: [id number] Arrival date & time: 01/26/24  1212      History   Chief Complaint Chief Complaint  Patient presents with   Nasal Congestion    Entered by patient    HPI Jose Hayes is a 63 y.o. male.   The history is provided by the patient.   Patient presents for complaints of right sided sinus pressure, nasal congestion, low-grade fever, and cough.  Patient states he was diagnosed with COVID approximately 2 to 3 weeks ago, states that the symptoms never resolved.  He states that he has pain in the right side of his nose.  He states that he never begin Paxlovid  that was previously prescribed, he has been using Astelin  nasal spray and Mucinex with minimal relief of his symptoms.  Patient reports prior history of sinus problems, states he is scheduled to follow-up with the ENT.  Past Medical History:  Diagnosis Date   Adrenal insufficiency (Addison's disease) (HCC)    Allergy    AMS (altered mental status) 05/02/2022   Anxiety    Basal cell carcinoma    nasal   COVID-19    1/14, 05/24/20   Esophageal cancer (HCC) 10/21/2016   GE junction   Fall 05/30/2023   GERD (gastroesophageal reflux disease)    Hyperglycemia    Hyperlipidemia    Hypertension    Left acoustic neuroma (HCC)    Severe left groin pain 12/28/2022   Sinus bradycardia 12/30/2022   Sleep apnea    CPAP   SVT (supraventricular tachycardia) (HCC)    2013   Thyroid  disease    Immunotherapy induced around 2019   Tobacco abuse    Stoped around 2005, smoked for about 15 years, 1/2 pack per day    Patient Active Problem List   Diagnosis Date Noted   COVID-19 01/12/2024   Chronic radicular lumbar pain 12/22/2023   Adverse effect of immune checkpoint inhibitor 12/02/2023   Polyarthralgia 12/02/2023   History of fainting 09/13/2023   Prediabetes 09/09/2023   Bronchitis 05/30/2023   GERD (gastroesophageal reflux disease) 05/30/2023   Skin lesion 04/25/2023   Lumbar  degenerative disc disease 12/28/2022   Status post bilateral inguinal hernia repair 12/28/2022   Chronic pain syndrome 12/28/2022   Lumbar facet arthropathy 12/28/2022   Chemotherapy-induced neuropathy (HCC) 12/28/2022   Chronic bilateral low back pain with left-sided sciatica 11/23/2022   Neuropathy 11/23/2022   Poison ivy 05/24/2022   Complex tear of lateral meniscus of right knee as current injury    Loose body in knee, right knee    Right knee pain 07/01/2021   Pulsatile tinnitus 06/22/2021   Left foot pain 06/22/2021   Inguinal hernia 12/19/2020   Adrenal insufficiency (HCC) 05/30/2020   Hypothyroidism 05/30/2020   Mood disorder (HCC) 05/30/2020   Left acoustic neuroma (HCC) 01/01/2020   Deviated nasal septum 08/07/2019   OSA (obstructive sleep apnea) 08/08/2017   Gastroesophageal cancer (HCC) 11/02/2016   Kidney stones 10/13/2016   Restless leg syndrome 09/04/2015   Hyperlipidemia 12/01/2011   SVT (supraventricular tachycardia) (HCC) 12/01/2011   Obesity, morbid (HCC) 12/01/2011   Hypertension 12/01/2011    Past Surgical History:  Procedure Laterality Date   COLONOSCOPY     INTERCOSTAL NERVE BLOCK     KNEE ARTHROSCOPY Right 07/30/2021   Procedure: right knee arthroscopy, debridement, removal loose body;  Surgeon: Addie Cordella Hamilton, MD;  Location: [facility] OR;  Service: Orthopedics;  Laterality: Right;   LAPAROSCOPIC CHOLECYSTECTOMY  1991  SHOULDER SURGERY Right 1985, 1991, 2000   left x3   [place] teeth extraction     UPPER GASTROINTESTINAL ENDOSCOPY         Home Medications    Prior to Admission medications   Medication Sig Start Date End Date Taking? Authorizing Provider  albuterol  (VENTOLIN  HFA) 108 (90 Base) MCG/ACT inhaler Inhale 2 puffs into the lungs every 6 (six) hours as needed. 11/08/23   Tamea Dedra CROME, MD  ALPRAZolam  (XANAX ) 1 MG tablet Take 1 mg by mouth 3 (three) times daily as needed for anxiety.    [provider]  amLODipine  (NORVASC ) 5  MG tablet TAKE 1 TABLET BY MOUTH TWICE A DAY 01/26/24   Gollan, Timothy J, MD  azelastine  (ASTELIN ) 0.1 % nasal spray Place 1 spray into both nostrils 2 (two) times daily. Use in each nostril as directed 01/14/24   Stuart Vernell Norris, PA-C  budesonide -formoterol  (SYMBICORT ) 160-4.5 MCG/ACT inhaler Inhale 2 puffs into the lungs 2 (two) times daily. 11/08/23   Tamea Dedra CROME, MD  calcium -vitamin D (OSCAL WITH D) 500-5 MG-MCG tablet Take 1 tablet by mouth.    [provider]  esomeprazole  (NEXIUM ) 40 MG capsule Take 40 mg by mouth in the morning and at bedtime.    [provider]  fluticasone  (FLONASE ) 50 MCG/ACT nasal spray Place into both nostrils daily.    [provider]  gabapentin  (NEURONTIN ) 100 MG capsule Take 1 capsule by mouth 2 (two) times daily. 100 @ noon, 100 @ 1800, 300 at bedtime 09/29/23 09/28/24  [provider]  gabapentin  (NEURONTIN ) 300 MG capsule Take 1 capsule (300 mg total) by mouth at bedtime for 15 days, THEN 1 capsule (300 mg total) 2 (two) times daily. 12/28/22 01/11/24  Marcelino Nurse, MD  hydrocortisone  (CORTEF ) 10 MG tablet Take 10-15 mg by mouth See admin instructions. Take 1.5 tablets (15 mg) by mouth in the morning (1 hour after levothyroxine ) & take 1 tablet (10 mg) by mouth in the afternoon at 2 pm.    [provider]  hydrocortisone  sodium succinate  (SOLU-CORTEF ) 100 MG injection Inject 2 mLs (100 mg total) into the muscle as needed. For adrenal crisis. 05/04/22   Awanda City, MD  levothyroxine  (SYNTHROID ) 75 MCG tablet Take 75 mcg by mouth daily before breakfast.    [provider]  liothyronine (CYTOMEL) 5 MCG tablet Take 5 mcg by mouth 2 (two) times daily.    [provider]  rosuvastatin  (CRESTOR ) 20 MG tablet Take 1 tablet (20 mg total) by mouth daily. 12/15/23   Bair, Kalpana, MD  sertraline (ZOLOFT) 100 MG tablet Take 150 mg by mouth daily.    [provider]  testosterone  cypionate  (DEPOTESTOSTERONE CYPIONATE) 200 MG/ML injection Inject into the muscle every Monday. 0.34mls 05/13/21   [provider]    Family History Family History  Problem Relation Age of Onset   Hypertension Mother    Hypertension Father    Pancreatic cancer Father    Clotting disorder Father        renal cell ca   Colon cancer Neg Hx    Esophageal cancer Neg Hx    Prostate cancer Neg Hx    Rectal cancer Neg Hx    Stomach cancer Neg Hx    Colon polyps Neg Hx     Social History Social History   Tobacco Use   Smoking status: Former    Current packs/day: 0.00    Average packs/day: 1 pack/day for 12.0 years (  12.0 ttl pk-yrs)    Types: Cigarettes    Start date: 52    Quit date: 2011    Years since quitting: 14.7   Smokeless tobacco: Never  Vaping Use   Vaping status: Former  Substance Use Topics   Alcohol use: Not Currently    Comment: occasionally   Drug use: No     Allergies   Patient has no known allergies.   Review of Systems Review of Systems Per HPI  Physical Exam Triage Vital Signs ED Triage Vitals  Encounter Vitals Group     BP 01/26/24 1221 126/84     Girls Systolic BP Percentile --      Girls Diastolic BP Percentile --      Boys Systolic BP Percentile --      Boys Diastolic BP Percentile --      Pulse Rate 01/26/24 1221 (!) 102     Resp 01/26/24 1221 18     Temp 01/26/24 1221 98.4 F (36.9 C)     Temp Source 01/26/24 1221 Oral     SpO2 01/26/24 1221 94 %     Weight --      Height --      Head Circumference --      Peak Flow --      Pain Score 01/26/24 1222 4     Pain Loc --      Pain Education --      Exclude from Growth Chart --    No data found.  Updated Vital Signs BP 126/84 (BP Location: Right Arm)   Pulse (!) 102   Temp 98.4 F (36.9 C) (Oral)   Resp 18   SpO2 94%   Visual Acuity Right Eye Distance:   Left Eye Distance:   Bilateral Distance:    Right Eye Near:   Left Eye Near:    Bilateral Near:     Physical  Exam Vitals and nursing note reviewed.  Constitutional:      General: He is not in acute distress.    Appearance: Normal appearance. He is well-developed.  HENT:     Head: Normocephalic and atraumatic.     Right Ear: Tympanic membrane, ear canal and external ear normal.     Left Ear: Tympanic membrane, ear canal and external ear normal.     Nose: Congestion present.     Right Turbinates: Enlarged and swollen.     Left Turbinates: Enlarged and swollen.     Right Sinus: No maxillary sinus tenderness or frontal sinus tenderness.     Left Sinus: No maxillary sinus tenderness or frontal sinus tenderness.     Mouth/Throat:     Lips: Pink.     Mouth: Mucous membranes are moist.     Pharynx: Postnasal drip present. No pharyngeal swelling, oropharyngeal exudate, posterior oropharyngeal erythema or uvula swelling.     Comments: Cobblestoning present to posterior oropharynx  Eyes:     Extraocular Movements: Extraocular movements intact.     Conjunctiva/sclera: Conjunctivae normal.     Pupils: Pupils are equal, round, and reactive to light.  Neck:     Thyroid : No thyromegaly.     Trachea: No tracheal deviation.  Cardiovascular:     Rate and Rhythm: Normal rate and regular rhythm.     Pulses: Normal pulses.     Heart sounds: Normal heart sounds.  Pulmonary:     Effort: Pulmonary effort is normal. No respiratory distress.     Breath sounds: Normal breath sounds. No  stridor. No wheezing, rhonchi or rales.  Abdominal:     General: Bowel sounds are normal. There is no distension.     Palpations: Abdomen is soft.     Tenderness: There is no abdominal tenderness.  Musculoskeletal:     Cervical back: Normal range of motion and neck supple.  Skin:    General: Skin is warm and dry.  Neurological:     General: No focal deficit present.     Mental Status: He is alert and oriented to person, place, and time.  Psychiatric:        Behavior: Behavior normal.        Thought Content: Thought content  normal.        Judgment: Judgment normal.      UC Treatments / Results  Labs (all labs ordered are listed, but only abnormal results are displayed) Labs Reviewed - No data to display  EKG   Radiology No results found.  Procedures Procedures (including critical care time)  Medications Ordered in UC Medications - No data to display  Initial Impression / Assessment and Plan / UC Course  I have reviewed the triage vital signs and the nursing notes.  Pertinent labs & imaging results that were available during my care of the patient were reviewed by me and considered in my medical decision making (see chart for details).  Patient with more than 2-week history of sinus pain and nasal congestion.  Patient with underlying history of sinus problems, he is scheduled to follow-up with ENT.  Given the ongoing symptoms with minimal relief despite use of medication, will treat for bacterial etiology.  Will start Augmentin  875/125 mg tablets twice daily.  Patient also advised to begin use of Flonase , states that he has this medication at home.  Supportive care recommendations were provided discussed with the patient to include fluids, rest, over-the-counter analgesics, and use of normal saline nasal spray.  Patient advised to follow-up with ENT as scheduled.  Patient was in agreement with this plan of care and verbalizes understanding.  All questions were answered.  Patient stable for discharge.  Final Clinical Impressions(s) / UC Diagnoses   Final diagnoses:  None   Discharge Instructions   None    ED Prescriptions   None    PDMP not reviewed this encounter.   Gilmer Etta PARAS, NP 01/26/24 1255

## 2024-01-26 NOTE — ED Triage Notes (Addendum)
 History of covid 2 weeks ago.  States can't get rid of nasal congestion.  States right side of nose hurts.  Has been using Astelin   nasal spray and mucinex

## 2024-03-01 ENCOUNTER — Ambulatory Visit (INDEPENDENT_AMBULATORY_CARE_PROVIDER_SITE_OTHER)

## 2024-03-01 ENCOUNTER — Ambulatory Visit
Admission: RE | Admit: 2024-03-01 | Discharge: 2024-03-01 | Disposition: A | Discharge status: Home / Self Care | Attending: Nurse Practitioner | Admitting: Nurse Practitioner

## 2024-03-01 ENCOUNTER — Other Ambulatory Visit: Payer: Self-pay

## 2024-03-01 VITALS — BP 125/76 | HR 80 | Temp 98.1°F | Resp 18

## 2024-03-01 DIAGNOSIS — J069 Acute upper respiratory infection, unspecified: Secondary | ICD-10-CM

## 2024-03-01 DIAGNOSIS — R052 Subacute cough: Secondary | ICD-10-CM

## 2024-03-01 DIAGNOSIS — R059 Cough, unspecified: Secondary | ICD-10-CM | POA: Diagnosis not present

## 2024-03-01 DIAGNOSIS — J329 Chronic sinusitis, unspecified: Secondary | ICD-10-CM | POA: Diagnosis not present

## 2024-03-01 MED ORDER — DOXYCYCLINE HYCLATE 100 MG PO CAPS: 100.0000 mg | ORAL_CAPSULE | Freq: Two times a day (BID) | ORAL | 0 refills | Status: AC

## 2024-03-01 MED ORDER — PROMETHAZINE-DM 6.25-15 MG/5ML PO SYRP: 5.0000 mL | ORAL_SOLUTION | Freq: Every evening | ORAL | 0 refills | Status: AC | PRN

## 2024-03-01 MED ORDER — BENZONATATE 100 MG PO CAPS: 100.0000 mg | ORAL_CAPSULE | Freq: Three times a day (TID) | ORAL | 0 refills | Status: AC | PRN

## 2024-03-01 MED ORDER — LIDOCAINE VISCOUS HCL 2 % MT SOLN: 15.0000 mL | OROMUCOSAL | 0 refills | Status: AC | PRN

## 2024-03-01 NOTE — ED Triage Notes (Signed)
 Pt reports cough, nasal drainage, sore throat for last several weeks. Denies any known fevers. Pt reports has been using inhaler and otc mucinex,  tylenol  as needed and reports minimal improvement of symptoms. Pt reports is scheduled to have sinus surgery in a few weeks as well.

## 2024-03-01 NOTE — ED Provider Notes (Signed)
 [facility]    CSN: [id number] Arrival date & time: 03/01/24  0948      History   Chief Complaint Chief Complaint  Patient presents with   Cough    Entered by patient    HPI Jose Hayes is a 63 y.o. male.   Patient presents today for 6-week history of congested cough, chest and nasal congestion, sore throat, decreased appetite, and fatigue.  He denies recent fever, body aches or chills, shortness of breath or chest pain, postnasal drainage, runny nose, headache, ear pain, sinus pressure, abdominal pain, nausea/vomiting, and diarrhea.  Reports he was seen initially in early September, diagnosed with COVID-19, and seen a couple weeks later, diagnosed with an ear infection and treated with Augmentin .  Reports he felt a little bit better at the third week of September and was riding motorcycles in [place] .  When he returned symptoms recurred.  Reports it has been worse for the past 10 days.  He has been taking Symbicort  inhaler, Mucinex, and azelastine  nasal spray without much improvement.  Reports he has sinus surgery scheduled for the beginning of November.  Wife is currently sick with similar symptoms.    Past Medical History:  Diagnosis Date   Adrenal insufficiency (Addison's disease) (HCC)    Allergy    AMS (altered mental status) 05/02/2022   Anxiety    Basal cell carcinoma    nasal   COVID-19    1/14, 05/24/20   Esophageal cancer (HCC) 10/21/2016   GE junction   Fall 05/30/2023   GERD (gastroesophageal reflux disease)    Hyperglycemia    Hyperlipidemia    Hypertension    Left acoustic neuroma (HCC)    Severe left groin pain 12/28/2022   Sinus bradycardia 12/30/2022   Sleep apnea    CPAP   SVT (supraventricular tachycardia)    2013   Thyroid  disease    Immunotherapy induced around 2019   Tobacco abuse    Stoped around 2005, smoked for about 15 years, 1/2 pack per day    Patient Active Problem List   Diagnosis Date Noted   COVID-19 01/12/2024    Chronic radicular lumbar pain 12/22/2023   Adverse effect of immune checkpoint inhibitor 12/02/2023   Polyarthralgia 12/02/2023   History of fainting 09/13/2023   Prediabetes 09/09/2023   Bronchitis 05/30/2023   GERD (gastroesophageal reflux disease) 05/30/2023   Skin lesion 04/25/2023   Lumbar degenerative disc disease 12/28/2022   Status post bilateral inguinal hernia repair 12/28/2022   Chronic pain syndrome 12/28/2022   Lumbar facet arthropathy 12/28/2022   Chemotherapy-induced neuropathy 12/28/2022   Chronic bilateral low back pain with left-sided sciatica 11/23/2022   Neuropathy 11/23/2022   Poison ivy 05/24/2022   Complex tear of lateral meniscus of right knee as current injury    Loose body in knee, right knee    Right knee pain 07/01/2021   Pulsatile tinnitus 06/22/2021   Left foot pain 06/22/2021   Inguinal hernia 12/19/2020   Adrenal insufficiency 05/30/2020   Hypothyroidism 05/30/2020   Mood disorder 05/30/2020   Left acoustic neuroma (HCC) 01/01/2020   Deviated nasal septum 08/07/2019   OSA (obstructive sleep apnea) 08/08/2017   Gastroesophageal cancer (HCC) 11/02/2016   Kidney stones 10/13/2016   Restless leg syndrome 09/04/2015   Hyperlipidemia 12/01/2011   SVT (supraventricular tachycardia) 12/01/2011   Obesity, morbid (HCC) 12/01/2011   Hypertension 12/01/2011    Past Surgical History:  Procedure Laterality Date   COLONOSCOPY     INTERCOSTAL NERVE  BLOCK     KNEE ARTHROSCOPY Right 07/30/2021   Procedure: right knee arthroscopy, debridement, removal loose body;  Surgeon: Addie Cordella Hamilton, MD;  Location: [facility] OR;  Service: Orthopedics;  Laterality: Right;   LAPAROSCOPIC CHOLECYSTECTOMY  1991   SHOULDER SURGERY Right 1985, 1991, 2000   left x3   [place] teeth extraction     UPPER GASTROINTESTINAL ENDOSCOPY         Home Medications    Prior to Admission medications   Medication Sig Start Date End Date Taking? Authorizing Provider  azelastine   (ASTELIN ) 0.1 % nasal spray Place 1 spray into both nostrils 2 (two) times daily. Use in each nostril as directed 01/14/24  Yes Stuart Vernell Norris, PA-C  benzonatate  (TESSALON ) 100 MG capsule Take 1 capsule (100 mg total) by mouth 3 (three) times daily as needed for cough. Do not take with alcohol or while operating or driving heavy machinery 89/76/74  Yes Chandra Raisin A, NP  doxycycline  (VIBRAMYCIN ) 100 MG capsule Take 1 capsule (100 mg total) by mouth 2 (two) times daily for 7 days. 03/01/24 03/08/24 Yes Chandra Raisin LABOR, NP  lidocaine  (XYLOCAINE ) 2 % solution Use as directed 15 mLs in the mouth or throat every 3 (three) hours as needed for mouth pain. 03/01/24  Yes Chandra Raisin LABOR, NP  promethazine -dextromethorphan (PROMETHAZINE -DM) 6.25-15 MG/5ML syrup Take 5 mLs by mouth at bedtime as needed for cough. Do not take with alcohol or while driving or operating heavy machinery.  May cause drowsiness. 03/01/24  Yes Chandra Raisin LABOR, NP  albuterol  (VENTOLIN  HFA) 108 (90 Base) MCG/ACT inhaler Inhale 2 puffs into the lungs every 6 (six) hours as needed. 11/08/23   Tamea Dedra CROME, MD  ALPRAZolam  (XANAX ) 1 MG tablet Take 1 mg by mouth 3 (three) times daily as needed for anxiety.    [provider]  amLODipine  (NORVASC ) 5 MG tablet TAKE 1 TABLET BY MOUTH TWICE A DAY 01/26/24   Gollan, Timothy J, MD  amoxicillin -clavulanate (AUGMENTIN ) 875-125 MG tablet Take 1 tablet by mouth every 12 (twelve) hours. 01/26/24   Leath-Warren, Etta PARAS, NP  budesonide -formoterol  (SYMBICORT ) 160-4.5 MCG/ACT inhaler Inhale 2 puffs into the lungs 2 (two) times daily. 11/08/23   Tamea Dedra CROME, MD  calcium -vitamin D (OSCAL WITH D) 500-5 MG-MCG tablet Take 1 tablet by mouth.    [provider]  esomeprazole  (NEXIUM ) 40 MG capsule Take 40 mg by mouth in the morning and at bedtime.    [provider]  fluticasone  (FLONASE ) 50 MCG/ACT nasal spray Place into both nostrils daily.    [provider]  gabapentin  (NEURONTIN ) 100 MG capsule Take 1 capsule by mouth 2 (two) times daily. 100 @ noon, 100 @ 1800, 300 at bedtime 09/29/23 09/28/24  [provider]  gabapentin  (NEURONTIN ) 300 MG capsule Take 1 capsule (300 mg total) by mouth at bedtime for 15 days, THEN 1 capsule (300 mg total) 2 (two) times daily. 12/28/22 01/11/24  Marcelino Nurse, MD  hydrocortisone  (CORTEF ) 10 MG tablet Take 10-15 mg by mouth See admin instructions. Take 1.5 tablets (15 mg) by mouth in the morning (1 hour after levothyroxine ) & take 1 tablet (10 mg) by mouth in the afternoon at 2 pm.    [provider]  hydrocortisone  sodium succinate  (SOLU-CORTEF ) 100 MG injection Inject 2 mLs (100 mg total) into the muscle as needed. For adrenal crisis. 05/04/22   Awanda City, MD  levothyroxine  (SYNTHROID ) 75 MCG tablet Take 75 mcg by mouth daily before  breakfast.    [provider]  liothyronine (CYTOMEL) 5 MCG tablet Take 5 mcg by mouth 2 (two) times daily.    [provider]  rosuvastatin  (CRESTOR ) 20 MG tablet Take 1 tablet (20 mg total) by mouth daily. 12/15/23   Bair, Kalpana, MD  sertraline (ZOLOFT) 100 MG tablet Take 150 mg by mouth daily.    [provider]  testosterone  cypionate (DEPOTESTOSTERONE CYPIONATE) 200 MG/ML injection Inject into the muscle every Monday. 0.80mls 05/13/21   [provider]    Family History Family History  Problem Relation Age of Onset   Hypertension Mother    Hypertension Father    Pancreatic cancer Father    Clotting disorder Father        renal cell ca   Colon cancer Neg Hx    Esophageal cancer Neg Hx    Prostate cancer Neg Hx    Rectal cancer Neg Hx    Stomach cancer Neg Hx    Colon polyps Neg Hx     Social History Social History   Tobacco Use   Smoking status: Former    Current packs/day: 0.00    Average packs/day: 1 pack/day for 12.0 years (12.0 ttl pk-yrs)    Types: Cigarettes    Start date: 1999    Quit date: 2011     Years since quitting: 14.8   Smokeless tobacco: Never  Vaping Use   Vaping status: Former  Substance Use Topics   Alcohol use: Not Currently    Comment: occasionally   Drug use: No     Allergies   Patient has no known allergies.   Review of Systems Review of Systems Per HPI  Physical Exam Triage Vital Signs ED Triage Vitals  Encounter Vitals Group     BP 03/01/24 1014 125/76     Girls Systolic BP Percentile --      Girls Diastolic BP Percentile --      Boys Systolic BP Percentile --      Boys Diastolic BP Percentile --      Pulse Rate 03/01/24 1014 80     Resp 03/01/24 1013 20     Temp 03/01/24 1014 98.1 F (36.7 C)     Temp Source 03/01/24 1014 Oral     SpO2 03/01/24 1014 93 %     Weight --      Height --      Head Circumference --      Peak Flow --      Pain Score 03/01/24 0954 6     Pain Loc --      Pain Education --      Exclude from Growth Chart --    No data found.  Updated Vital Signs BP 125/76 (BP Location: Right Arm)   Pulse 80   Temp 98.1 F (36.7 C) (Oral)   Resp 18   SpO2 93%   Visual Acuity Right Eye Distance:   Left Eye Distance:   Bilateral Distance:    Right Eye Near:   Left Eye Near:    Bilateral Near:     Physical Exam Vitals and nursing note reviewed.  Constitutional:      General: He is not in acute distress.    Appearance: Normal appearance. He is not ill-appearing or toxic-appearing.  HENT:     Head: Normocephalic and atraumatic.     Right Ear: Tympanic membrane, ear canal and external ear normal.     Left Ear: Tympanic membrane, ear canal and  external ear normal.     Nose: Congestion present. No rhinorrhea.     Right Sinus: No maxillary sinus tenderness or frontal sinus tenderness.     Left Sinus: No maxillary sinus tenderness or frontal sinus tenderness.     Mouth/Throat:     Mouth: Mucous membranes are moist.     Pharynx: Oropharynx is clear. Posterior oropharyngeal erythema present. No oropharyngeal exudate.   Eyes:     General: No scleral icterus.    Extraocular Movements: Extraocular movements intact.  Cardiovascular:     Rate and Rhythm: Normal rate and regular rhythm.  Pulmonary:     Effort: Pulmonary effort is normal. No respiratory distress.     Breath sounds: Normal breath sounds. No wheezing, rhonchi or rales.  Musculoskeletal:     Cervical back: Normal range of motion and neck supple.  Lymphadenopathy:     Cervical: No cervical adenopathy.  Skin:    General: Skin is warm and dry.     Coloration: Skin is not jaundiced or pale.     Findings: No erythema or rash.  Neurological:     Mental Status: He is alert and oriented to person, place, and time.  Psychiatric:        Behavior: Behavior is cooperative.      UC Treatments / Results  Labs (all labs ordered are listed, but only abnormal results are displayed) Labs Reviewed - No data to display  EKG   Radiology DG Chest 2 View Result Date: 03/01/2024 CLINICAL DATA:  cough x weeks EXAM: CHEST - 2 VIEW COMPARISON:  05/09/2021 FINDINGS: Right chest port in place terminating at the cavoatrial junction. No focal airspace consolidation, pleural effusion, or pneumothorax. No cardiomegaly.No acute fracture or destructive lesion. Multilevel thoracic osteophytosis. Cholecystectomy clips. IMPRESSION: No acute cardiopulmonary abnormality. Electronically Signed   By: Rogelia Myers M.D.   On: 03/01/2024 10:54    Procedures Procedures (including critical care time)  Medications Ordered in UC Medications - No data to display  Initial Impression / Assessment and Plan / UC Course  I have reviewed the triage vital signs and the nursing notes.  Pertinent labs & imaging results that were available during my care of the patient were reviewed by me and considered in my medical decision making (see chart for details).   Patient is well-appearing, normotensive, afebrile, not tachycardic, not tachypneic, oxygenating well on room air.    1.Subacute cough 2.Chronic sinusitis, unspecified location 3.Viral URI with cough Vitals and exam are reassuring today Chest x-ray is negative for acute cardiopulmonary process, no pneumonia Patient has underlying sinusitis with new viral symptoms that have been going on about 10 days We discussed continuing supportive care for the next 3 to 4 days, prescription given for doxycycline  if symptoms are not significantly improved or if they acutely worsen Additionally, cough suppressant medication was prescribed today Return and ER precautions discussed  The patient was given the opportunity to ask questions.  All questions answered to their satisfaction.  The patient is in agreement to this plan.   Final Clinical Impressions(s) / UC Diagnoses   Final diagnoses:  Subacute cough  Chronic sinusitis, unspecified location  Viral URI with cough     Discharge Instructions      You have a viral upper respiratory infection.  Symptoms should improve over the next week to 10 days.  If you develop chest pain or shortness of breath, go to the emergency room.  If symptoms are not improved in the next few days,  take the oral doxycycline  as prescribed to treat for a bacterial infection.  Some things that can make you feel better are: - Increased rest - Increasing fluid with water/sugar free electrolytes - Acetaminophen  and ibuprofen as needed for fever/pain - Salt water gargling, chloraseptic spray and throat lozenges - OTC guaifenesin (Mucinex) 600 mg twice daily for congestion - Saline sinus flushes or a neti pot - Humidifying the air - Lidocaine  mouth rinses for sore throat -Tessalon  Perles every 8 hours as needed for dry cough and cough syrup at night time as needed     ED Prescriptions     Medication Sig Dispense Auth. Provider   benzonatate  (TESSALON ) 100 MG capsule Take 1 capsule (100 mg total) by mouth 3 (three) times daily as needed for cough. Do not take with alcohol or while  operating or driving heavy machinery 21 capsule Chandra Raisin A, NP   promethazine -dextromethorphan (PROMETHAZINE -DM) 6.25-15 MG/5ML syrup Take 5 mLs by mouth at bedtime as needed for cough. Do not take with alcohol or while driving or operating heavy machinery.  May cause drowsiness. 118 mL Chandra Raisin A, NP   doxycycline  (VIBRAMYCIN ) 100 MG capsule Take 1 capsule (100 mg total) by mouth 2 (two) times daily for 7 days. 14 capsule Chandra Raisin A, NP   lidocaine  (XYLOCAINE ) 2 % solution Use as directed 15 mLs in the mouth or throat every 3 (three) hours as needed for mouth pain. 100 mL Chandra Raisin LABOR, NP      PDMP not reviewed this encounter.   Chandra Raisin LABOR, NP 03/01/24 [id number]

## 2024-03-01 NOTE — Discharge Instructions (Addendum)
 You have a viral upper respiratory infection.  Symptoms should improve over the next week to 10 days.  If you develop chest pain or shortness of breath, go to the emergency room.  If symptoms are not improved in the next few days, take the oral doxycycline  as prescribed to treat for a bacterial infection.  Some things that can make you feel better are: - Increased rest - Increasing fluid with water/sugar free electrolytes - Acetaminophen  and ibuprofen as needed for fever/pain - Salt water gargling, chloraseptic spray and throat lozenges - OTC guaifenesin (Mucinex) 600 mg twice daily for congestion - Saline sinus flushes or a neti pot - Humidifying the air - Lidocaine  mouth rinses for sore throat -Tessalon  Perles every 8 hours as needed for dry cough and cough syrup at night time as needed

## 2024-03-16 DIAGNOSIS — J331 Polypoid sinus degeneration: Secondary | ICD-10-CM | POA: Diagnosis not present

## 2024-03-16 DIAGNOSIS — J324 Chronic pansinusitis: Secondary | ICD-10-CM | POA: Diagnosis not present

## 2024-03-16 DIAGNOSIS — J3489 Other specified disorders of nose and nasal sinuses: Secondary | ICD-10-CM | POA: Diagnosis not present

## 2024-03-26 DIAGNOSIS — J3489 Other specified disorders of nose and nasal sinuses: Secondary | ICD-10-CM | POA: Diagnosis not present

## 2024-03-26 DIAGNOSIS — Z9889 Other specified postprocedural states: Secondary | ICD-10-CM | POA: Diagnosis not present

## 2024-03-26 DIAGNOSIS — J324 Chronic pansinusitis: Secondary | ICD-10-CM | POA: Diagnosis not present

## 2024-03-26 DIAGNOSIS — J329 Chronic sinusitis, unspecified: Secondary | ICD-10-CM | POA: Diagnosis not present

## 2024-03-30 DIAGNOSIS — G4733 Obstructive sleep apnea (adult) (pediatric): Secondary | ICD-10-CM | POA: Diagnosis not present

## 2024-03-30 DIAGNOSIS — E291 Testicular hypofunction: Secondary | ICD-10-CM | POA: Diagnosis not present

## 2024-03-30 DIAGNOSIS — Z9289 Personal history of other medical treatment: Secondary | ICD-10-CM | POA: Diagnosis not present

## 2024-03-30 DIAGNOSIS — E273 Drug-induced adrenocortical insufficiency: Secondary | ICD-10-CM | POA: Diagnosis not present

## 2024-03-30 DIAGNOSIS — E039 Hypothyroidism, unspecified: Secondary | ICD-10-CM | POA: Diagnosis not present

## 2024-03-30 DIAGNOSIS — E669 Obesity, unspecified: Secondary | ICD-10-CM | POA: Diagnosis not present

## 2024-04-09 DIAGNOSIS — E039 Hypothyroidism, unspecified: Secondary | ICD-10-CM | POA: Diagnosis not present

## 2024-04-09 DIAGNOSIS — E291 Testicular hypofunction: Secondary | ICD-10-CM | POA: Diagnosis not present

## 2024-04-09 DIAGNOSIS — G4733 Obstructive sleep apnea (adult) (pediatric): Secondary | ICD-10-CM | POA: Diagnosis not present

## 2024-04-10 DIAGNOSIS — F411 Generalized anxiety disorder: Secondary | ICD-10-CM | POA: Diagnosis not present

## 2024-04-10 DIAGNOSIS — Z1330 Encounter for screening examination for mental health and behavioral disorders, unspecified: Secondary | ICD-10-CM | POA: Diagnosis not present

## 2024-04-10 DIAGNOSIS — F331 Major depressive disorder, recurrent, moderate: Secondary | ICD-10-CM | POA: Diagnosis not present

## 2024-04-15 ENCOUNTER — Other Ambulatory Visit: Payer: Self-pay | Admitting: Student in an Organized Health Care Education/Training Program

## 2024-04-15 DIAGNOSIS — G894 Chronic pain syndrome: Secondary | ICD-10-CM

## 2024-05-15 ENCOUNTER — Encounter: Payer: Self-pay | Admitting: Student in an Organized Health Care Education/Training Program

## 2024-06-08 ENCOUNTER — Ambulatory Visit: Admitting: Pulmonary Disease

## 2024-06-25 ENCOUNTER — Other Ambulatory Visit

## 2024-06-26 ENCOUNTER — Ambulatory Visit: Admitting: Student in an Organized Health Care Education/Training Program

## 2024-06-27 ENCOUNTER — Ambulatory Visit

## 2024-07-12 ENCOUNTER — Ambulatory Visit: Admitting: Pulmonary Disease

## 2024-11-14 ENCOUNTER — Ambulatory Visit
